# Patient Record
Sex: Male | Born: 1955 | Race: Black or African American | Hispanic: No | Marital: Married | Smoking: Former smoker
Health system: Southern US, Community
[De-identification: ages and names within clinical notes are randomized; demographics above are authoritative.]

## PROBLEM LIST (undated history)

## (undated) DIAGNOSIS — K859 Acute pancreatitis without necrosis or infection, unspecified: Secondary | ICD-10-CM

## (undated) DIAGNOSIS — E559 Vitamin D deficiency, unspecified: Secondary | ICD-10-CM

## (undated) DIAGNOSIS — N400 Enlarged prostate without lower urinary tract symptoms: Secondary | ICD-10-CM

## (undated) DIAGNOSIS — M1990 Unspecified osteoarthritis, unspecified site: Secondary | ICD-10-CM

## (undated) DIAGNOSIS — E785 Hyperlipidemia, unspecified: Secondary | ICD-10-CM

## (undated) HISTORY — DX: Acute pancreatitis without necrosis or infection, unspecified: K85.90

## (undated) HISTORY — PX: KNEE SURGERY: SHX244

## (undated) HISTORY — DX: Unspecified osteoarthritis, unspecified site: M19.90

---

## 2001-11-29 ENCOUNTER — Encounter: Payer: Self-pay | Admitting: Internal Medicine

## 2001-11-29 ENCOUNTER — Encounter: Admission: RE | Admit: 2001-11-29 | Discharge: 2001-11-29 | Payer: Self-pay | Admitting: Internal Medicine

## 2004-08-31 ENCOUNTER — Ambulatory Visit (HOSPITAL_COMMUNITY): Admission: RE | Admit: 2004-08-31 | Discharge: 2004-08-31 | Payer: Self-pay | Admitting: Gastroenterology

## 2014-07-22 LAB — HM COLONOSCOPY

## 2015-04-06 ENCOUNTER — Emergency Department (HOSPITAL_COMMUNITY)
Admission: EM | Admit: 2015-04-06 | Discharge: 2015-04-06 | Disposition: A | Discharge status: Home / Self Care | Attending: Emergency Medicine | Admitting: Emergency Medicine

## 2015-04-06 ENCOUNTER — Encounter (HOSPITAL_COMMUNITY): Payer: Self-pay | Admitting: Emergency Medicine

## 2015-04-06 ENCOUNTER — Emergency Department (HOSPITAL_COMMUNITY)

## 2015-04-06 DIAGNOSIS — Z23 Encounter for immunization: Secondary | ICD-10-CM | POA: Insufficient documentation

## 2015-04-06 DIAGNOSIS — Y998 Other external cause status: Secondary | ICD-10-CM | POA: Diagnosis not present

## 2015-04-06 DIAGNOSIS — Z87891 Personal history of nicotine dependence: Secondary | ICD-10-CM | POA: Insufficient documentation

## 2015-04-06 DIAGNOSIS — W260XXA Contact with knife, initial encounter: Secondary | ICD-10-CM | POA: Insufficient documentation

## 2015-04-06 DIAGNOSIS — Y9389 Activity, other specified: Secondary | ICD-10-CM | POA: Diagnosis not present

## 2015-04-06 DIAGNOSIS — Y9289 Other specified places as the place of occurrence of the external cause: Secondary | ICD-10-CM | POA: Insufficient documentation

## 2015-04-06 DIAGNOSIS — S61218A Laceration without foreign body of other finger without damage to nail, initial encounter: Secondary | ICD-10-CM

## 2015-04-06 DIAGNOSIS — S6991XA Unspecified injury of right wrist, hand and finger(s), initial encounter: Secondary | ICD-10-CM | POA: Diagnosis present

## 2015-04-06 DIAGNOSIS — Z8639 Personal history of other endocrine, nutritional and metabolic disease: Secondary | ICD-10-CM | POA: Diagnosis not present

## 2015-04-06 DIAGNOSIS — S61210A Laceration without foreign body of right index finger without damage to nail, initial encounter: Secondary | ICD-10-CM | POA: Diagnosis not present

## 2015-04-06 HISTORY — DX: Vitamin D deficiency, unspecified: E55.9

## 2015-04-06 HISTORY — DX: Hyperlipidemia, unspecified: E78.5

## 2015-04-06 MED ORDER — BACITRACIN ZINC 500 UNIT/GM EX OINT: 1.0000 "application " | TOPICAL_OINTMENT | Freq: Two times a day (BID) | CUTANEOUS | Status: DC

## 2015-04-06 MED ORDER — BACITRACIN ZINC 500 UNIT/GM EX OINT: TOPICAL_OINTMENT | Freq: Two times a day (BID) | CUTANEOUS | Status: DC

## 2015-04-06 MED ORDER — LIDOCAINE HCL 2 % IJ SOLN
10.0000 mL | Freq: Once | INTRAMUSCULAR | Status: AC
  Administered 2015-04-06: 200 mg
  Filled 2015-04-06: qty 20

## 2015-04-06 MED ORDER — TETANUS-DIPHTH-ACELL PERTUSSIS 5-2.5-18.5 LF-MCG/0.5 IM SUSP
0.5000 mL | Freq: Once | INTRAMUSCULAR | Status: AC
  Administered 2015-04-06: 0.5 mL via INTRAMUSCULAR
  Filled 2015-04-06: qty 0.5

## 2015-04-06 NOTE — Discharge Instructions (Signed)
You have been seen today for a laceration. Your imaging was free from abnormalities. Your laceration was sutured and you are to return to the ED or similar clinic for suture removal in 7 days. Return sooner if signs of infection arise: redness, discharge, swelling, increased heat, or fever/chills, nausea/vomiting. Follow up with PCP as needed.  The dressing should be left in place for 24 hours, after which time it can be opened to air. Wound may be gently cleaned with mild soap and water or half-strength peroxide after 24 hours to prevent crusting over the suture knots. An antibiotic ointment can be applied to the wound as well, apply the ointment two times per day until suture removal.   Laceration Care, Adult A laceration is a cut that goes through all of the layers of the skin and into the tissue that is right under the skin. Some lacerations heal on their own. Others need to be closed with stitches (sutures), staples, skin adhesive strips, or skin glue. Proper laceration care minimizes the risk of infection and helps the laceration to heal better. HOW TO CARE FOR YOUR LACERATION If sutures or staples were used:  Keep the wound clean and dry.  If you were given a bandage (dressing), you should change it at least one time per day or as told by your health care provider. You should also change it if it becomes wet or dirty.  Keep the wound completely dry for the first 24 hours or as told by your health care provider. After that time, you may shower or bathe. However, make sure that the wound is not soaked in water until after the sutures or staples have been removed.  Clean the wound one time each day or as told by your health care provider:  Wash the wound with soap and water.  Rinse the wound with water to remove all soap.  Pat the wound dry with a clean towel. Do not rub the wound.  After cleaning the wound, apply a thin layer of antibiotic ointmentas told by your health care provider.  This will help to prevent infection and keep the dressing from sticking to the wound.  Have the sutures or staples removed as told by your health care provider. If skin adhesive strips were used:  Keep the wound clean and dry.  If you were given a bandage (dressing), you should change it at least one time per day or as told by your health care provider. You should also change it if it becomes dirty or wet.  Do not get the skin adhesive strips wet. You may shower or bathe, but be careful to keep the wound dry.  If the wound gets wet, pat it dry with a clean towel. Do not rub the wound.  Skin adhesive strips fall off on their own. You may trim the strips as the wound heals. Do not remove skin adhesive strips that are still stuck to the wound. They will fall off in time. If skin glue was used:  Try to keep the wound dry, but you may briefly wet it in the shower or bath. Do not soak the wound in water, such as by swimming.  After you have showered or bathed, gently pat the wound dry with a clean towel. Do not rub the wound.  Do not do any activities that will make you sweat heavily until the skin glue has fallen off on its own.  Do not apply liquid, cream, or ointment medicine to the  wound while the skin glue is in place. Using those may loosen the film before the wound has healed.  If you were given a bandage (dressing), you should change it at least one time per day or as told by your health care provider. You should also change it if it becomes dirty or wet.  If a dressing is placed over the wound, be careful not to apply tape directly over the skin glue. Doing that may cause the glue to be pulled off before the wound has healed.  Do not pick at the glue. The skin glue usually remains in place for 5-10 days, then it falls off of the skin. General Instructions  Take over-the-counter and prescription medicines only as told by your health care provider.  If you were prescribed an  antibiotic medicine or ointment, take or apply it as told by your doctor. Do not stop using it even if your condition improves.  To help prevent scarring, make sure to cover your wound with sunscreen whenever you are outside after stitches are removed, after adhesive strips are removed, or when glue remains in place and the wound is healed. Make sure to wear a sunscreen of at least 30 SPF.  Do not scratch or pick at the wound.  Keep all follow-up visits as told by your health care provider. This is important.  Check your wound every day for signs of infection. Watch for:  Redness, swelling, or pain.  Fluid, blood, or pus.  Raise (elevate) the injured area above the level of your heart while you are sitting or lying down, if possible. SEEK MEDICAL CARE IF:  You received a tetanus shot and you have swelling, severe pain, redness, or bleeding at the injection site.  You have a fever.  A wound that was closed breaks open.  You notice a bad smell coming from your wound or your dressing.  You notice something coming out of the wound, such as wood or glass.  Your pain is not controlled with medicine.  You have increased redness, swelling, or pain at the site of your wound.  You have fluid, blood, or pus coming from your wound.  You notice a change in the color of your skin near your wound.  You need to change the dressing frequently due to fluid, blood, or pus draining from the wound.  You develop a new rash.  You develop numbness around the wound. SEEK IMMEDIATE MEDICAL CARE IF:  You develop severe swelling around the wound.  Your pain suddenly increases and is severe.  You develop painful lumps near the wound or on skin that is anywhere on your body.  You have a red streak going away from your wound.  The wound is on your hand or foot and you cannot properly move a finger or toe.  The wound is on your hand or foot and you notice that your fingers or toes look pale or  bluish.   This information is not intended to replace advice given to you by your health care provider. Make sure you discuss any questions you have with your health care provider.   Document Released: 06/07/2005 Document Revised: 10/22/2014 Document Reviewed: 06/03/2014 Elsevier Interactive Patient Education 2016 [employer].   Emergency Department Resource Guide 1) Find a Doctor and Pay Out of Pocket Although you won't have to find out who is covered by your insurance plan, it is a good idea to ask around and get recommendations. You will then need to  call the office and see if the doctor you have chosen will accept you as a new patient and what types of options they offer for patients who are self-pay. Some doctors offer discounts or will set up payment plans for their patients who do not have insurance, but you will need to ask so you aren't surprised when you get to your appointment.  2) Contact Your Local Health Department Not all health departments have doctors that can see patients for sick visits, but many do, so it is worth a call to see if yours does. If you don't know where your local health department is, you can check in your phone book. The CDC also has a tool to help you locate your state's health department, and many state websites also have listings of all of their local health departments.  3) Find a [facility] If your illness is not likely to be very severe or complicated, you may want to try a walk in clinic. These are popping up all over the country in pharmacies, drugstores, and shopping centers. They're usually staffed by nurse practitioners or physician assistants that have been trained to treat common illnesses and complaints. They're usually fairly quick and inexpensive. However, if you have serious medical issues or chronic medical problems, these are probably not your best option.  No Primary Care Doctor: - Call Health Connect at  [phone] - they can help you  locate a primary care doctor that  accepts your insurance, provides certain services, etc. - Physician Referral Service- [phone]  Chronic Pain Problems: Organization         Address  Phone   Notes  [facility]  [phone] Patients need to be referred by their primary care doctor.   Medication Assistance: Organization         Address  Phone   Notes  [facility] Medication [facility] [address] [place], [place] [address] [phone] --Must be a resident of [facility] -- Must have NO insurance coverage whatsoever (no Medicaid/ Medicare, etc.) -- The pt. MUST have a primary care doctor that directs their care regularly and follows them in the community   MedAssist  [phone]   [employer]  [phone]    Agencies that provide inexpensive medical care: Organization         Address  Phone   Notes  [place]  [phone]   [facility]    [phone]   [facility] [place],  [address] [phone]   [place] [address], [place] [phone]   Planned Parenthood    [phone]   [facility]    [phone]   [place] and [place] [address], [place] Phone:  [phone], Fax:  [phone] Hours of Operation:  9 am - 6 pm, M-F.  Also accepts Medicaid/Medicare and self-pay.  [facility] for [place] [address], [address], [place] Phone: [phone], Fax: [phone]. Hours of Operation:  8:30 am - 5:30 pm, M-F.  Also accepts Medicaid and self-pay.  [facility] [place] [address], [place] Phone: [phone]   [place], [place], [place] [phone], Ext. [phone] Mondays & Thursdays: 7-9 AM.  First 15 patients are seen on a first come, first serve basis.  [place]  Providers:  Organization         Address  Phone   Notes  [facility] [address], [place] [phone] Also accepts self-pay patients.  [facility] [address], [place]  [phone]   [place], [address], [place] [phone]   [facility] [address], [place] [phone]   Lucianne Lei [address], [place]   [phone] Only accepts [place] Access [place] patients after they have their name applied to their card.   Self-Pay (no insurance) in [facility]:  Organization         Address  Phone   Notes  Sickle Cell Patients, [facility] [place] [phone]   [facility] [place] [phone]   [facility] [place]  [place], [place], [place] [phone]   [facility]/Dr. Osei-Bonsu  [address], [place] or [address], [place] [phone] Phone number for both [place] and [place] locations is the same.  Urgent Medical and [facility] [address], [place] [phone]   [facility] [address] [place] or [address] [phone]   [facility] [address] [place] [phone], phone; [phone], fax Sees patients 1st and 3rd Saturday of every month.  Must not qualify for public or private insurance (i.e. Medicaid, Medicare, [place] Health Choice, Veterans' Benefits)  Household income should be no more than 200% of the poverty level The clinic cannot treat you if you are pregnant or think you are pregnant  Sexually transmitted diseases are not treated at the clinic.    Dental Care: Organization         Address  Phone  Notes  [facility] Department of [facility] [phone] Accepts children up to age 3 who are enrolled in [place] or [place]; pregnant women with a Medicaid card; and children who have applied for Medicaid or [place] Health Choice, but were declined, whose parents can pay a reduced fee at time of service.  [facility] [place] [facility] Department of [facility]  [address], [place] [phone] Accepts children up to age 61 who are enrolled in [place] or [place]; pregnant women with a Medicaid card; and children who have applied for Medicaid or [place] Health Choice, but were declined, whose parents can pay a reduced fee at time of service.  [place] Adult Dental Access PROGRAM  [place] [phone] Patients are seen by appointment only. Walk-ins are not accepted. [place] will see patients 30 years of age and older. Monday - Tuesday (8am-5pm) Most Wednesdays (8:30-5pm) $30 per visit, cash only  [facility] Adult Dental Access PROGRAM  [address], [facility] [phone] Patients are seen by appointment only. Walk-ins are not accepted. [place] will see patients 109 years of age and older. One Wednesday Evening (Monthly: Volunteer Based).  $30 per visit, cash only  [place]  [phone] for adults; Children under age 68, call Graduate Pediatric Dentistry at [phone]. Children aged 39-14, please call [phone] to request a  pediatric application.  Dental services are provided in all areas of dental care including fillings, crowns and bridges, complete and partial dentures, implants, gum treatment, root canals, and extractions. Preventive care is also provided. Treatment is provided to both adults and children. Patients are selected via a lottery and there is often a waiting list.   [facility] [place] [address] [place]  [phone] [email or web address]   [facility]  [address] [place], [place] [phone], Ext. [phone] Second and Fourth Thursday of each month, opens at 6:30 AM; Clinic ends at 9 AM.  Patients are seen on a first-come first-served basis, and a limited number are seen during each clinic.   [facility]  [address] Hillard Danker [place], [place] [phone]   Eligibility Requirements You must have lived in [place], [place], or [place] counties for at least the last three months.   You cannot be eligible for state or federal sponsored [employer], including [employer], [place], or [employer].   You generally cannot be eligible for healthcare insurance through your employer.    How to apply: Eligibility screenings are held every Tuesday and Wednesday afternoon from 1:00 pm until 4:00 pm. You do not need an appointment for the interview!  [facility] [address] [place], [place]   [place]  [place] Department  [place]  [phone]    Behavioral Health Resources in the Community: Intensive Outpatient Programs Organization         Address  Phone  Notes  [place]. [address] [place], [place] [phone]   [facility] Outpatient [address], [place], [place]   ADS: Alcohol & Drug Svcs [address] [place], [place]   [place] [address]  [place], [place] or [phone]   Substance Abuse Resources Organization         Address  Phone  Notes  Alcohol and Drug Services  [phone]   [place]  [phone]   The [place]   Chinita Pester  [phone]   Residential & Outpatient Substance Abuse Program  [phone]   Psychological Services Organization         Address  Phone  Notes  [facility] [place]  [place]  [phone]   [place] [address] [place] [address], [place] or [phone]    Mobile Crisis Teams Organization         Address  Phone  Notes  [facility]atives, Mobile Crisis Care Unit  [phone]   Assertive Psychotherapeutic Services  [address] [place], [place]   [place] Levels [address] [place] [phone]    Self-Help/Support Groups Organization         Address  Phone             Notes  [place]. of [place] - variety of support groups  [place] Call for more information  Narcotics Anonymous (NA), [facility]aring [facility] [address], [employer] [place]  2 meetings at this location   Special educational needs teacher         Address  Phone  Notes  [facility] [place],    [place]  [place]  [place],  [address], [place], [place]   [place], [place] [phone] Admissions: 8am-3pm M-F  Incentives Substance [place] [address],    [place], [place] [phone]   [facility] [address] [place], [place], [place]   [facility] [address],  [place], [place]   [facility]ight Programs - Intensive Outpatient [address] Kristeen [address], [place], [place]   [facility] ([place].) [place].,  [place], [place] [phone] or [phone]   [facility]idential Treatment Services (RTS) [address], [place], [place] Accepts Medicaid  [facility] [place] [address]  [place] [phone] Substance Abuse/Addiction Treatment   [facility] Organization         Address  Phone  Notes  [facility]  [phone]   Domenic Schwab, PhD [address] Arlis [place], [place]   [phone] or [phone]    Zayante Tabor [place], [place] [phone]   [facility] [address] [place], [place] [phone] Insurance/Medicaid/sponsorship through [facility] and Families [address], [address] [place]                                    [place], [place] [phone] [place] [address], [place] [phone]    Dr. Adele Schilder  [phone]   [facility] Dept. 1) [address] [place] 2) [place] 3)  [place] [address], [place] [phone]  [phone]   [place] [phone] or [phone] (After Hours)

## 2015-04-06 NOTE — ED Notes (Signed)
Shawn, PA at bedside to suture lac

## 2015-04-06 NOTE — ED Provider Notes (Signed)
CSN: [id number]     Arrival date & time 04/06/15  1652 History   First MD Initiated Contact with Patient 04/06/15 1752     Chief Complaint  Patient presents with  . Finger Injury     (Consider location/radiation/quality/duration/timing/severity/associated sxs/prior Treatment) HPI  Robert Donaldson is a 59 y.o. male, presents with a laceration to the right index finger that pt did himself accidentally with a knife about an hour ago. Pt denies any further injuries. Bleeding controlled prior to arrival. Pt denies being a current smoker.  Past Medical History  Diagnosis Date  . HLD (hyperlipidemia)   . Vitamin D deficiency    Past Surgical History  Procedure Laterality Date  . Knee surgery     Family History  Problem Relation Age of Onset  . Hyperlipidemia Sister    Social History  Substance Use Topics  . Smoking status: Former Smoker    Types: Cigarettes  . Smokeless tobacco: None  . Alcohol Use: 0.6 oz/week    1 Glasses of wine per week     Comment: social    Review of Systems  Skin: Positive for wound.  Neurological: Negative for syncope, weakness and light-headedness.      Allergies  Review of patient's allergies indicates no known allergies.  Home Medications   Prior to Admission medications   Medication Sig Start Date End Date Taking? Authorizing Provider  bacitracin ointment Apply 1 application topically 2 (two) times daily. 04/06/15   Laveta Gilkey C Nataline Basara, PA-C   BP 115/79 mmHg  Pulse 75  Temp(Src) 98.1 F (36.7 C) (Oral)  Resp 20  SpO2 98% Physical Exam  Constitutional: He appears well-developed and well-nourished. No distress.  Cardiovascular: Normal rate and regular rhythm.   Pulmonary/Chest: Effort normal.  Musculoskeletal:  Full ROM to right index finger and hand.  Neurological: He is alert.  Strength 5/5 in both index fingers. Grip strength equal. No numbness, tingling or other neurologic deficits.   Skin: Skin is warm and dry. He is not diaphoretic.   Nursing note and vitals reviewed.   ED Course  .Marland KitchenLaceration Repair Date/Time: 04/06/2015 7:07 PM Performed by: Lorayne Bender Authorized by: Lorayne Bender Consent: Verbal consent obtained. Risks and benefits: risks, benefits and alternatives were discussed Consent given by: patient Patient understanding: patient states understanding of the procedure being performed Patient consent: the patient's understanding of the procedure matches consent given Procedure consent: procedure consent matches procedure scheduled Relevant documents: relevant documents present and verified Test results: test results available and properly labeled Site marked: the operative site was marked Imaging studies: imaging studies available Patient identity confirmed: verbally with patient and arm band Time out: Immediately prior to procedure a "time out" was called to verify the correct patient, procedure, equipment, support staff and site/side marked as required. Body area: upper extremity Location details: right index finger Laceration length: 2 cm Foreign bodies: no foreign bodies Tendon involvement: none Nerve involvement: none Vascular damage: no Anesthesia: local infiltration Local anesthetic: lidocaine 2% without epinephrine Anesthetic total: 1.5 ml Patient sedated: no Preparation: Patient was prepped and draped in the usual sterile fashion. Irrigation solution: saline Irrigation method: syringe Amount of cleaning: standard Debridement: none Degree of undermining: none Skin closure: 4-0 nylon Number of sutures: 5 Technique: simple Approximation: close Approximation difficulty: simple Dressing: 4x4 sterile gauze, splint and antibiotic ointment Patient tolerance: Patient tolerated the procedure well with no immediate complications   (including critical care time) Labs Review Labs Reviewed - No data to display  Imaging Review Dg Finger Index Right  04/06/2015  CLINICAL DATA:  Laceration  index finger today.  Initial encounter. EXAM: RIGHT INDEX FINGER 2+V COMPARISON:  None. FINDINGS: No fracture, dislocation or radiopaque foreign body is identified. The patient's laceration is not visualized. IMPRESSION: Negative exam. Electronically Signed   By: Inge Rise M.D.   On: 04/06/2015 18:31   I have personally reviewed and evaluated these images and lab results as part of my medical decision-making.   EKG Interpretation None      MDM   Final diagnoses:  Laceration of index finger, initial encounter    Robert Donaldson presents with an isolated laceration to the right index finger at the PIP joint.   Full ROM, capillary refill normal, neuro and motor intact. Doubt nerve or tendon involvement. If finger xray is normal, will suture and discharge patient with appropriate wound care instructions. Pt to return in 7 days for suture removal. Wound to be covered with antibiotic ointment and a sterile bandage. Finger xray without evidence of fracture. Pt has not had a tetanus vaccine within the last five years, ordered tetanus vaccine.    Lorayne Bender, PA-C 04/06/15 1921  Lacretia Leigh, MD 04/06/15 [phone]

## 2015-04-06 NOTE — ED Notes (Signed)
Pt reported having rt 2nd finger lac s/p to cutting self with knife with controlled bleeding noted.

## 2015-12-15 ENCOUNTER — Encounter (HOSPITAL_COMMUNITY): Payer: Self-pay | Admitting: Emergency Medicine

## 2015-12-15 ENCOUNTER — Emergency Department (HOSPITAL_COMMUNITY)
Admission: EM | Admit: 2015-12-15 | Discharge: 2015-12-15 | Disposition: A | Discharge status: Home / Self Care | Attending: Emergency Medicine | Admitting: Emergency Medicine

## 2015-12-15 ENCOUNTER — Emergency Department (HOSPITAL_COMMUNITY)

## 2015-12-15 DIAGNOSIS — E785 Hyperlipidemia, unspecified: Secondary | ICD-10-CM | POA: Insufficient documentation

## 2015-12-15 DIAGNOSIS — Z87891 Personal history of nicotine dependence: Secondary | ICD-10-CM | POA: Insufficient documentation

## 2015-12-15 DIAGNOSIS — M79674 Pain in right toe(s): Secondary | ICD-10-CM | POA: Diagnosis present

## 2015-12-15 MED ORDER — IBUPROFEN 200 MG PO TABS
600.0000 mg | ORAL_TABLET | Freq: Once | ORAL | Status: AC
  Administered 2015-12-15: 600 mg via ORAL
  Filled 2015-12-15: qty 3

## 2015-12-15 NOTE — ED Notes (Signed)
Patient reports right index toe pain after hitting it. States that pain is getting worse.

## 2015-12-15 NOTE — ED Provider Notes (Signed)
CSN: [id number]     Arrival date & time 12/15/15  2029 History  By signing my name below, I, Jasmyn B. Alexander, attest that this documentation has been prepared under the direction and in the presence of Janetta Hora, PA-C.  Electronically Signed: Tedra Coupe. Sheppard Coil, ED Scribe. 12/15/2015. 8:56 PM.    Chief Complaint  Patient presents with  . Toe Pain   The history is provided by the patient. No language interpreter was used.   HPI Comments: Robert Donaldson is a 60 y.o. male with no significant history presents to the Emergency Department complaining of gradual onset, constant, worsening, right second toe pain x 1 day. Pt states that he woke up with pain on 12/15/15. Pt is unaware of any recent injury to the toe. He has associated redness and swelling of the right 2nd toe. Pt states pain is exacerbated with flexion of the toes and ambulation. No hx of Gout. Denies any fever, pruritus, or drainage from the toe. Pt denies any other symptoms at this time.   Past Medical History  Diagnosis Date  . HLD (hyperlipidemia)   . Vitamin D deficiency    Past Surgical History  Procedure Laterality Date  . Knee surgery     Family History  Problem Relation Age of Onset  . Hyperlipidemia Sister    Social History  Substance Use Topics  . Smoking status: Former Smoker    Types: Cigarettes  . Smokeless tobacco: None  . Alcohol Use: 0.6 oz/week    1 Glasses of wine per week     Comment: social    Review of Systems  Constitutional: Negative for fever.  Musculoskeletal: Positive for myalgias, joint swelling and arthralgias.  Skin: Positive for color change.  All other systems reviewed and are negative.  Allergies  Review of patient's allergies indicates no known allergies.  Home Medications   Prior to Admission medications   Medication Sig Start Date End Date Taking? Authorizing Provider  bacitracin ointment Apply 1 application topically 2 (two) times daily. 04/06/15   Shawn C Joy, PA-C    BP 130/72 mmHg  Pulse 86  Temp(Src) 98.5 F (36.9 C) (Oral)  Resp 18  SpO2 100%   Physical Exam  Constitutional: He is oriented to person, place, and time. He appears well-developed and well-nourished. No distress.  HENT:  Head: Normocephalic and atraumatic.  Eyes: Conjunctivae are normal. Pupils are equal, round, and reactive to light. Right eye exhibits no discharge. Left eye exhibits no discharge. No scleral icterus.  Neck: Normal range of motion.  Cardiovascular: Normal rate.   Pulmonary/Chest: Effort normal. No respiratory distress.  Abdominal: Soft. He exhibits no distension.  Musculoskeletal:  Right second toe: Mild swelling and redness. No deformity. Moderate tenderness to palpation of DIP and PIP joints . Decreased ROM. N/V intact.    Neurological: He is alert and oriented to person, place, and time.  Skin: Skin is warm and dry.  Psychiatric: He has a normal mood and affect.  Nursing note and vitals reviewed.   ED Course  Procedures (including critical care time) DIAGNOSTIC STUDIES: Oxygen Saturation is 100% on RA, normal by my interpretation.    COORDINATION OF CARE: 8:49 PM-Discussed treatment plan which includes X-ray of right foot and order of Ibuprofen with pt at bedside and pt agreed to plan.   Imaging Review Dg Foot Complete Right  12/15/2015  CLINICAL DATA:  Nontraumatic pain to the third MTP EXAM: RIGHT FOOT COMPLETE - 3+ VIEW COMPARISON:  None. FINDINGS: There  is no evidence of fracture or dislocation. There is no evidence of arthropathy or other focal bone abnormality. Soft tissues are unremarkable. IMPRESSION: Negative. Electronically Signed   By: Andreas Newport M.D.   On: 12/15/2015 21:25   I have personally reviewed and evaluated these images and lab results as part of my medical decision-making.   MDM   Final diagnoses:  Pain of toe of right foot   60 year old male who presents with atraumatic 2nd toe pain. Unclear etiology. Xray is  negative for acute abnormality. Vitals are WNL and stable. Will treat with supportive care - Ibuprofen 600mg  given here in ED and advised to continue at home. PCP follow up if symptoms are not improving. Patient is NAD, non-toxic, with stable VS. Patient is informed of clinical course, understands medical decision making process, and agrees with plan. Opportunity for questions provided and all questions answered. Return precautions given.   I personally performed the services described in this documentation, which was scribed in my presence. The recorded information has been reviewed and is accurate.    Recardo Evangelist, PA-C 12/16/15 1000  Charlesetta Shanks, MD 12/20/15 1046

## 2015-12-15 NOTE — Discharge Instructions (Signed)

## 2017-10-06 ENCOUNTER — Ambulatory Visit (INDEPENDENT_AMBULATORY_CARE_PROVIDER_SITE_OTHER): Admitting: Podiatry

## 2017-10-06 ENCOUNTER — Encounter: Payer: Self-pay | Admitting: Podiatry

## 2017-10-06 VITALS — BP 133/67 | HR 74 | Ht 69.0 in | Wt 180.0 lb

## 2017-10-06 DIAGNOSIS — M722 Plantar fascial fibromatosis: Secondary | ICD-10-CM | POA: Diagnosis not present

## 2017-10-06 DIAGNOSIS — M216X2 Other acquired deformities of left foot: Secondary | ICD-10-CM

## 2017-10-06 DIAGNOSIS — M216X1 Other acquired deformities of right foot: Secondary | ICD-10-CM

## 2017-10-06 DIAGNOSIS — M21969 Unspecified acquired deformity of unspecified lower leg: Secondary | ICD-10-CM

## 2017-10-06 NOTE — Patient Instructions (Signed)
Seen for bilateral heel pain. Noted of weak first ray bilateral, mild tightness in Achilles tendon bilateral with increased Pronation. Cortisone injection given to both heels. Night Splint dispensed bilateral. Metatarsal binder dispensed bilateral. Will get authorization on custom orthotics.

## 2017-10-06 NOTE — Progress Notes (Signed)
SUBJECTIVE: 62 y.o. year old male presents complaining of bilateral heel pain duration of over 30 years. Was here for the same problem and was treated with Cortisone injection. The injection helped. On feet off and on varies.  Review of Systems  Constitutional: Negative.   HENT: Negative.   Respiratory: Negative.   Cardiovascular: Negative.   Gastrointestinal: Negative.   Genitourinary: Negative.   Musculoskeletal: Negative.   Skin: Negative.     OBJECTIVE: DERMATOLOGIC EXAMINATION: No abnormal findings.  VASCULAR EXAMINATION OF LOWER LIMBS: Pedal pulses: All pedal pulses are palpable with normal pulsation.  Capillary Filling times within 3 seconds in all digits.  No edema or erythema noted. Temperature gradient from tibial crest to dorsum of foot is within normal bilateral.  NEUROLOGIC EXAMINATION OF THE LOWER LIMBS: Achilles DTR is present and within normal. Monofilament (Semmes-Weinstein 10-gm) sensory testing positive 6 out of 6, bilateral. Vibratory sensations(128Hz  turning fork) intact at medial and lateral forefoot bilateral.  Sharp and Dull discriminatory sensations at the plantar ball of hallux is intact bilateral.   MUSCULOSKELETAL EXAMINATION: Positive for tight Achilles tendon bilateral. Hypermobile first ray bilateral L>R.  ASSESSMENT: Plantar fasciitis bilateral. Contracture bilateral Achilles tendon. Hypermobile first ray L>R. STJ hyperpronation bilateral.  PLAN: Reviewed findings and available treatment options. Achilles tendon stretch exercise reviewed to follow daily. Night Splint dispensed with instruction x 2. Metatarsal binder dispensed x 2. As per request, both heels injected with mixture of 4 mg Dexamethasone, 4 mg Triamcinolone, and 1 cc of 0.5% Marcaine plain. Patient tolerated well without difficulty.

## 2017-10-14 ENCOUNTER — Telehealth: Payer: Self-pay | Admitting: *Deleted

## 2017-10-14 NOTE — Telephone Encounter (Signed)
Called insurance and for CPT code L3020 the patient has a $400 deductible. After this has been met the plan covers 100% of coinsurance and  100% of copays.  Reference # for the call was [id number] 

## 2018-04-22 ENCOUNTER — Encounter: Payer: Self-pay | Admitting: Family Medicine

## 2018-06-06 DIAGNOSIS — H353112 Nonexudative age-related macular degeneration, right eye, intermediate dry stage: Secondary | ICD-10-CM | POA: Diagnosis not present

## 2018-06-06 DIAGNOSIS — H40023 Open angle with borderline findings, high risk, bilateral: Secondary | ICD-10-CM | POA: Diagnosis not present

## 2018-06-06 DIAGNOSIS — H2513 Age-related nuclear cataract, bilateral: Secondary | ICD-10-CM | POA: Diagnosis not present

## 2018-06-23 DIAGNOSIS — M79672 Pain in left foot: Secondary | ICD-10-CM | POA: Diagnosis not present

## 2018-06-23 DIAGNOSIS — M722 Plantar fascial fibromatosis: Secondary | ICD-10-CM | POA: Diagnosis not present

## 2018-06-23 DIAGNOSIS — M79671 Pain in right foot: Secondary | ICD-10-CM | POA: Diagnosis not present

## 2018-07-08 ENCOUNTER — Other Ambulatory Visit: Payer: Self-pay | Admitting: Nurse Practitioner

## 2018-07-26 ENCOUNTER — Encounter: Payer: Self-pay | Admitting: Nurse Practitioner

## 2018-07-26 ENCOUNTER — Ambulatory Visit: Admitting: Internal Medicine

## 2018-07-26 ENCOUNTER — Ambulatory Visit

## 2018-07-26 ENCOUNTER — Ambulatory Visit: Admitting: Nurse Practitioner

## 2018-07-26 ENCOUNTER — Ambulatory Visit (INDEPENDENT_AMBULATORY_CARE_PROVIDER_SITE_OTHER): Payer: Medicare Other | Admitting: Nurse Practitioner

## 2018-07-26 VITALS — BP 116/70 | HR 53 | Temp 97.6°F | Wt 183.8 lb

## 2018-07-26 DIAGNOSIS — Z125 Encounter for screening for malignant neoplasm of prostate: Secondary | ICD-10-CM | POA: Diagnosis not present

## 2018-07-26 DIAGNOSIS — R7309 Other abnormal glucose: Secondary | ICD-10-CM

## 2018-07-26 DIAGNOSIS — R351 Nocturia: Secondary | ICD-10-CM | POA: Insufficient documentation

## 2018-07-26 DIAGNOSIS — Z23 Encounter for immunization: Secondary | ICD-10-CM | POA: Diagnosis not present

## 2018-07-26 DIAGNOSIS — E785 Hyperlipidemia, unspecified: Secondary | ICD-10-CM | POA: Diagnosis not present

## 2018-07-26 DIAGNOSIS — M25532 Pain in left wrist: Secondary | ICD-10-CM

## 2018-07-26 DIAGNOSIS — Z0000 Encounter for general adult medical examination without abnormal findings: Secondary | ICD-10-CM | POA: Diagnosis not present

## 2018-07-26 DIAGNOSIS — Z114 Encounter for screening for human immunodeficiency virus [HIV]: Secondary | ICD-10-CM

## 2018-07-26 LAB — POCT URINALYSIS DIPSTICK
Bilirubin, UA: NEGATIVE
Glucose, UA: NEGATIVE
KETONES UA: NEGATIVE
Leukocytes, UA: NEGATIVE
NITRITE UA: NEGATIVE
PH UA: 5.5 (ref 5.0–8.0)
PROTEIN UA: NEGATIVE
RBC UA: NEGATIVE
Spec Grav, UA: 1.02 (ref 1.010–1.025)
UROBILINOGEN UA: 0.2 U/dL

## 2018-07-26 NOTE — Progress Notes (Addendum)
Subjective:     Patient ID: Robert Donaldson , male    DOB: 1956-05-11 , 63 y.o.   MRN: [id number]   Chief Complaint  Patient presents with  . Medicare Wellness   Men's preventive visit. Patient Health Questionnaire (PHQ-2) is    Clinical Support from 07/26/2018 in [facility]  PHQ-2 Total Score  0    . Patient is on a regular diet. Marital status: Married. Relevant history for alcohol use is:   Social History   Substance and Sexual Activity  Alcohol Use Yes  . Alcohol/week: 1.0 standard drinks  . Types: 1 Glasses of wine per week   Comment: social  . Relevant history for tobacco use is:  Social History   Tobacco Use  Smoking Status Former Smoker  . Types: Cigarettes  Smokeless Tobacco Never Used   HPI  Hyperlipidemia - taking rosuvastatin daily  Wrist Pain   The pain is present in the left wrist. This is a new problem. The current episode started more than 1 month ago. The problem occurs intermittently. The problem has been unchanged. The pain is mild. Pertinent negatives include no fever or numbness. The treatment provided no relief. Family history does not include gout.     Past Medical History:  Diagnosis Date  . HLD (hyperlipidemia)   . Vitamin D deficiency      Family History  Problem Relation Age of Onset  . Hyperlipidemia Sister      Current Outpatient Medications:  .  Nutritional Supplements (VITAMIN D BOOSTER PO), Take 1 capsule by mouth daily., Disp: , Rfl:  .  rosuvastatin (CRESTOR) 5 MG tablet, TAKE 1 TABLET DAILY, Disp: 90 tablet, Rfl: 0   No Known Allergies   Review of Systems  Constitutional: Negative for fever.  Neurological: Negative for numbness.     Today's Vitals   07/26/18 1146  BP: 116/70  Pulse: (!) 53  Temp: 97.6 F (36.4 C)  TempSrc: Oral  SpO2: 97%  Weight: 183 lb 12.8 oz (83.4 kg)  PainSc: 0-No pain   Body mass index is 27.14 kg/m.   Objective:  Physical Exam Constitutional:      Appearance:  Normal appearance.  Neck:     Musculoskeletal: Normal range of motion and neck supple.  Cardiovascular:     Rate and Rhythm: Normal rate and regular rhythm.     Pulses: Normal pulses.     Heart sounds: Normal heart sounds. No murmur.  Pulmonary:     Effort: Pulmonary effort is normal. No respiratory distress.     Breath sounds: Normal breath sounds.  Abdominal:     General: Abdomen is flat.  Musculoskeletal:        General: Swelling (left wrist posterior) present.  Skin:    General: Skin is warm and dry.  Neurological:     General: No focal deficit present.     Mental Status: He is alert.  Psychiatric:        Mood and Affect: Mood normal.         Assessment And Plan:     1. Medicare annual wellness visit, initial  Welcome to Medicare visit done today  Pt's annual wellness exam was performed and geriatric assessment reviewed.   Pt has no new identiafble wellness concerns at this time.   WIll obtain routine labs.   Will obtain UA and micro.   Behavior modifications discussed and diet history reviewed. Pt will continue to exercise regularly and modify diet, with low GI,  plant based foods and decrease food intake of processed foods.   Recommend intake of daily multivitamin, Vitamin D, and calcium.  Recommond mammogram and colonoscopy for preventive screenings, as well as recommend immunizations that include influenza (up to date) and TDAP  2. Health maintenance examination . Behavior modifications discussed and diet history reviewed.   . Pt will continue to exercise regularly and modify diet with low GI, plant based foods and decrease intake of processed foods.  . Recommend intake of daily multivitamin, Vitamin D, and calcium.   3. Encounter for immunization  - Pneumococcal polysaccharide vaccine 23-valent greater than or equal to 2yo subcutaneous/IM  4. Encounter for prostate cancer screening  - PSA  5. Hyperlipidemia, unspecified hyperlipidemia type  Chronic,  controlled  Continue with current medications - Lipid panel  6. Left wrist pain  Left wrist with soft area to anterior left hand  Causing pain and discomfort  - Ambulatory referral to Hand Surgery  7. Abnormal glucose  Chronic, controlled  Continue with current medications  Encouraged to limit intake of sugary foods and drinks  Encouraged to increase physical activity to 150 minutes per week - CBC no Diff - Hemoglobin A1c  8. Nocturia  Will check PSA - PSA  9. Screening for human immunodeficiency virus  - HIV antibody (with reflex)       Minette Brine, FNP    Subjective:   Robert Donaldson is a 63 y.o. male who presents for a Welcome to Medicare exam.   Review of Systems: See above       Objective:    Today's Vitals   07/26/18 1146  BP: 116/70  Pulse: (!) 53  Temp: 97.6 F (36.4 C)  TempSrc: Oral  SpO2: 97%  Weight: 183 lb 12.8 oz (83.4 kg)  PainSc: 0-No pain   Body mass index is 27.14 kg/m.  Medications Outpatient Encounter Medications as of 07/26/2018  Medication Sig  . Nutritional Supplements (VITAMIN D BOOSTER PO) Take 1 capsule by mouth daily.  . rosuvastatin (CRESTOR) 5 MG tablet TAKE 1 TABLET DAILY   No facility-administered encounter medications on file as of 07/26/2018.      History: Past Medical History:  Diagnosis Date  . HLD (hyperlipidemia)   . Vitamin D deficiency    Past Surgical History:  Procedure Laterality Date  . KNEE SURGERY      Family History  Problem Relation Age of Onset  . Hyperlipidemia Sister    Social History   Occupational History  . Not on file  Tobacco Use  . Smoking status: Former Smoker    Types: Cigarettes  . Smokeless tobacco: Never Used  Substance and Sexual Activity  . Alcohol use: Yes    Alcohol/week: 1.0 standard drinks    Types: 1 Glasses of wine per week    Comment: social  . Drug use: No  . Sexual activity: Never   Tobacco Counseling Counseling given: Not  Answered   Immunizations and Health Maintenance Immunization History  Administered Date(s) Administered  . Pneumococcal Polysaccharide-23 07/26/2018  . Tdap 04/06/2015   Health Maintenance Due  Topic Date Due  . COLONOSCOPY  06/22/2003    Activities of Daily Living In your present state of health, do you have any difficulty performing the following activities: 07/26/2018  Hearing? N  Vision? N  Difficulty concentrating or making decisions? N  Walking or climbing stairs? N  Dressing or bathing? N  Doing errands, shopping? N  Some recent data might be hidden  Physical Exam  (optional), or other factors deemed appropriate based on the beneficiary's medical and social history and current clinical standards.  Advanced Directives: Does Patient Have a Medical Advance Directive?: No Would patient like information on creating a medical advance directive?: Yes (MAU/Ambulatory/Procedural Areas - Information given)    Assessment:    This is a routine wellness  examination for this patient .   Vision/Hearing screen  Hearing Screening   125Hz  250Hz  500Hz  1000Hz  2000Hz  3000Hz  4000Hz  6000Hz  8000Hz   Right ear:   20 20 20  20     Left ear:   20 20 20  20       Visual Acuity Screening   Right eye Left eye Both eyes  Without correction:     With correction: 20/20 20/30 20/30     Dietary issues and exercise activities discussed:     Goals    . Weight (lb) < 170 lb (77.1 kg)     I would like to lose my belly fat       Depression Screen PHQ 2/9 Scores 09/12/2018  PHQ - 2 Score 0     Fall Risk No flowsheet data found.  Cognitive Function MMSE - Mini Mental State Exam 07/26/2018  Orientation to time 5  Orientation to Place 5  Registration 3  Attention/ Calculation 5  Recall 3  Language- name 2 objects 2  Language- repeat 1  Language- follow 3 step command 3  Language- read & follow direction 1  Write a sentence 1        Patient Care Team: Minette Brine, FNP as PCP -  General (General Practice)     Plan:     I have personally reviewed and noted the following in the patient's chart:   . Medical and social history . Use of alcohol, tobacco or illicit drugs  . Current medications and supplements . Functional ability and status . Nutritional status . Physical activity . Advanced directives . List of other physicians . Hospitalizations, surgeries, and ER visits in previous 12 months . Vitals . Screenings to include cognitive, depression, and falls . Referrals and appointments  In addition, I have reviewed and discussed with patient certain preventive protocols, quality metrics, and best practice recommendations. A written personalized care plan for preventive services as well as general preventive health recommendations were provided to patient.    Minette Brine, FNP 09/12/2018

## 2018-07-26 NOTE — Patient Instructions (Addendum)
Robert Donaldson , Thank you for taking time to come for your Medicare Wellness Visit. I appreciate your ongoing commitment to your health goals. Please review the following plan we discussed and let me know if I can assist you in the future.   These are the goals we discussed: Goals    . Weight (lb) < 170 lb (77.1 kg)     I would like to lose my belly fat       This is a list of the screening recommended for you and due dates:  Health Maintenance  Topic Date Due  . Colon Cancer Screening  06/22/2003  . Pneumonia vaccines (1 of 2 - PCV13) 06/21/2018  . Flu Shot  09/19/2018*  . Tetanus Vaccine  04/05/2025  .  Hepatitis C: One time screening is recommended by Center for Disease Control  (CDC) for  adults born from [birth year] through 1967.   Completed  . HIV Screening  Completed  *Topic was postponed. The date shown is not the original due date.    Health Maintenance After Age 49 After age 60, you are at a higher risk for certain long-term diseases and infections as well as injuries from falls. Falls are a major cause of broken bones and head injuries in people who are older than age 39. Getting regular preventive care can help to keep you healthy and well. Preventive care includes getting regular testing and making lifestyle changes as recommended by your health care provider. Talk with your health care provider about:  Which screenings and tests you should have. A screening is a test that checks for a disease when you have no symptoms.  A diet and exercise plan that is right for you. What should I know about screenings and tests to prevent falls? Screening and testing are the best ways to find a health problem early. Early diagnosis and treatment give you the best chance of managing medical conditions that are common after age 43. Certain conditions and lifestyle choices may make you more likely to have a fall. Your health care provider may recommend:  Regular vision checks. Poor vision and  conditions such as cataracts can make you more likely to have a fall. If you wear glasses, make sure to get your prescription updated if your vision changes.  Medicine review. Work with your health care provider to regularly review all of the medicines you are taking, including over-the-counter medicines. Ask your health care provider about any side effects that may make you more likely to have a fall. Tell your health care provider if any medicines that you take make you feel dizzy or sleepy.  Osteoporosis screening. Osteoporosis is a condition that causes the bones to get weaker. This can make the bones weak and cause them to break more easily.  Blood pressure screening. Blood pressure changes and medicines to control blood pressure can make you feel dizzy.  Strength and balance checks. Your health care provider may recommend certain tests to check your strength and balance while standing, walking, or changing positions.  Foot health exam. Foot pain and numbness, as well as not wearing proper footwear, can make you more likely to have a fall.  Depression screening. You may be more likely to have a fall if you have a fear of falling, feel emotionally low, or feel unable to do activities that you used to do.  Alcohol use screening. Using too much alcohol can affect your balance and may make you more likely to have a  fall. What actions can I take to lower my risk of falls? General instructions  Talk with your health care provider about your risks for falling. Tell your health care provider if: ? You fall. Be sure to tell your health care provider about all falls, even ones that seem minor. ? You feel dizzy, sleepy, or off-balance.  Take over-the-counter and prescription medicines only as told by your health care provider. These include any supplements.  Eat a healthy diet and maintain a healthy weight. A healthy diet includes low-fat dairy products, low-fat (lean) meats, and fiber from whole  grains, beans, and lots of fruits and vegetables. Home safety  Remove any tripping hazards, such as rugs, cords, and clutter.  Install safety equipment such as grab bars in bathrooms and safety rails on stairs.  Keep rooms and walkways well-lit. Activity   Follow a regular exercise program to stay fit. This will help you maintain your balance. Ask your health care provider what types of exercise are appropriate for you.  If you need a cane or walker, use it as recommended by your health care provider.  Wear supportive shoes that have nonskid soles. Lifestyle  Do not drink alcohol if your health care provider tells you not to drink.  If you drink alcohol, limit how much you have: ? 0-1 drink a day for women. ? 0-2 drinks a day for men.  Be aware of how much alcohol is in your drink. In the [place] one drink equals one typical bottle of beer (12 oz), one-half glass of wine (5 oz), or one shot of hard liquor (1 oz).  Do not use any products that contain nicotine or tobacco, such as cigarettes and e-cigarettes. If you need help quitting, ask your health care provider. Summary  Having a healthy lifestyle and getting preventive care can help to protect your health and wellness after age 35.  Screening and testing are the best way to find a health problem early and help you avoid having a fall. Early diagnosis and treatment give you the best chance for managing medical conditions that are more common for people who are older than age 61.  Falls are a major cause of broken bones and head injuries in people who are older than age 57. Take precautions to prevent a fall at home.  Work with your health care provider to learn what changes you can make to improve your health and wellness and to prevent falls. This information is not intended to replace advice given to you by your health care provider. Make sure you discuss any questions you have with your health care provider. Document  Released: 04/20/2017 Document Revised: 04/20/2017 Document Reviewed: 04/20/2017 Elsevier Interactive Patient Education  2019 [employer] Inc.  Pneumococcal Conjugate Vaccine (PCV13): What You Need to Know 1. Why get vaccinated? Vaccination can protect both children and adults from pneumococcal disease. Pneumococcal disease is caused by bacteria that can spread from person to person through close contact. It can cause ear infections, and it can also lead to more serious infections of the:  Lungs (pneumonia),  Blood (bacteremia), and  Covering of the brain and spinal cord (meningitis). Pneumococcal pneumonia is most common among adults. Pneumococcal meningitis can cause deafness and brain damage, and it kills about 1 child in 10 who get it. Anyone can get pneumococcal disease, but children under 40 years of age and adults 67 years and older, people with certain medical conditions, and cigarette smokers are at the highest risk. Before there  was a vaccine, the [place] saw:  more than 700 cases of meningitis,  about 13,000 blood infections,  about 5 million ear infections, and  about 200 deaths in children under 5 each year from pneumococcal disease. Since vaccine became available, severe pneumococcal disease in these children has fallen by 88%. About 18,000 older adults die of pneumococcal disease each year in the [place]. Treatment of pneumococcal infections with penicillin and other drugs is not as effective as it used to be, because some strains of the disease have become resistant to these drugs. This makes prevention of the disease, through vaccination, even more important. 2. PCV13 vaccine Pneumococcal conjugate vaccine (called PCV13) protects against 13 types of pneumococcal bacteria. PCV13 is routinely given to children at 2, 4, 6, and 12-83 months of age. It is also recommended for children and adults 4 to 15 years of age with certain health conditions, and for all adults  3 years of age and older. Your doctor can give you details. 3. Some people should not get this vaccine Anyone who has ever had a life-threatening allergic reaction to a dose of this vaccine, to an earlier pneumococcal vaccine called PCV7, or to any vaccine containing diphtheria toxoid (for example, DTaP), should not get PCV13. Anyone with a severe allergy to any component of PCV13 should not get the vaccine. Tell your doctor if the person being vaccinated has any severe allergies. If the person scheduled for vaccination is not feeling well, your healthcare provider might decide to reschedule the shot on another day. 4. Risks of a vaccine reaction With any medicine, including vaccines, there is a chance of reactions. These are usually mild and go away on their own, but serious reactions are also possible. Problems reported following PCV13 varied by age and dose in the series. The most common problems reported among children were:  About half became drowsy after the shot, had a temporary loss of appetite, or had redness or tenderness where the shot was given.  About 1 out of 3 had swelling where the shot was given.  About 1 out of 3 had a mild fever, and about 1 in 20 had a fever over 102.58F.  Up to about 8 out of 10 became fussy or irritable. Adults have reported pain, redness, and swelling where the shot was given; also mild fever, fatigue, headache, chills, or muscle pain. Young children who get PCV13 along with inactivated flu vaccine at the same time may be at increased risk for seizures caused by fever. Ask your doctor for more information. Problems that could happen after any vaccine:  People sometimes faint after a medical procedure, including vaccination. Sitting or lying down for about 15 minutes can help prevent fainting, and injuries caused by a fall. Tell your doctor if you feel dizzy, or have vision changes or ringing in the ears.  Some older children and adults get severe pain in  the shoulder and have difficulty moving the arm where a shot was given. This happens very rarely.  Any medication can cause a severe allergic reaction. Such reactions from a vaccine are very rare, estimated at about 1 in a million doses, and would happen within a few minutes to a few hours after the vaccination. As with any medicine, there is a very small chance of a vaccine causing a serious injury or death. The safety of vaccines is always being monitored. For more information, visit: http://www.aguilar.org/ 5. What if there is a serious reaction? What should I  look for?  Look for anything that concerns you, such as signs of a severe allergic reaction, very high fever, or unusual behavior. Signs of a severe allergic reaction can include hives, swelling of the face and throat, difficulty breathing, a fast heartbeat, dizziness, and weakness-usually within a few minutes to a few hours after the vaccination. What should I do?  If you think it is a severe allergic reaction or other emergency that can't wait, call 9-1-1 or get the person to the nearest hospital. Otherwise, call your doctor. Reactions should be reported to the Vaccine Adverse Event Reporting System (VAERS). Your doctor should file this report, or you can do it yourself through the VAERS web site at www.vaers.SamedayNews.es, or by calling 956 143 3562. VAERS does not give medical advice. 6. The National Vaccine Injury Compensation Program The [employer] Vaccine Injury Compensation Program (VICP) is a federal program that was created to compensate people who may have been injured by certain vaccines. Persons who believe they may have been injured by a vaccine can learn about the program and about filing a claim by calling 740-371-1320 or visiting the [place] website at GoldCloset.com.ee. There is a time limit to file a claim for compensation. 7. How can I learn more?  Ask your healthcare provider. He or she can give you the  vaccine package insert or suggest other sources of information.  Call your local or state health department.  Contact the Centers for Disease Control and Prevention (CDC): ? Call (281)679-8387 (1-800-CDC-INFO) or ? Visit CDC's website at http://hunter.com/ Vaccine Information Statement PCV13 Vaccine (04/25/2014) This information is not intended to replace advice given to you by your health care provider. Make sure you discuss any questions you have with your health care provider. Document Released: 04/04/2006 Document Revised: 01/17/2018 Document Reviewed: 01/17/2018 Elsevier Interactive Patient Education  2019 [employer].

## 2018-07-27 LAB — LIPID PANEL
Chol/HDL Ratio: 3.5 ratio (ref 0.0–5.0)
Cholesterol, Total: 207 mg/dL — ABNORMAL HIGH (ref 100–199)
HDL: 59 mg/dL (ref >39)
LDL Calculated: 127 mg/dL — ABNORMAL HIGH (ref 0–99)
Triglycerides: 107 mg/dL (ref 0–149)
VLDL Cholesterol Cal: 21 mg/dL (ref 5–40)

## 2018-07-27 LAB — HEMOGLOBIN A1C
Est. average glucose Bld gHb Est-mCnc: 131 mg/dL
Hgb A1c MFr Bld: 6.2 % — ABNORMAL HIGH (ref 4.8–5.6)

## 2018-07-27 LAB — CBC
HEMOGLOBIN: 14.9 g/dL (ref 13.0–17.7)
Hematocrit: 44.9 % (ref 37.5–51.0)
MCH: 29.6 pg (ref 26.6–33.0)
MCHC: 33.2 g/dL (ref 31.5–35.7)
MCV: 89 fL (ref 79–97)
Platelets: 261 10*3/uL (ref 150–450)
RBC: 5.04 x10E6/uL (ref 4.14–5.80)
RDW: 12.7 % (ref 11.6–15.4)
WBC: 6.5 10*3/uL (ref 3.4–10.8)

## 2018-07-27 LAB — PSA: PROSTATE SPECIFIC AG, SERUM: 1 ng/mL (ref 0.0–4.0)

## 2018-07-27 LAB — HIV ANTIBODY (ROUTINE TESTING W REFLEX): HIV SCREEN 4TH GENERATION: NONREACTIVE

## 2018-08-17 ENCOUNTER — Telehealth: Payer: Self-pay

## 2018-08-17 NOTE — Telephone Encounter (Signed)
Call patient and make aware he will need an office visit to be evaluated for his cold symptoms.

## 2018-08-17 NOTE — Telephone Encounter (Signed)
Pt called wanting some med called in for a cold he can't get rid of. Pt had cold symptoms for about a week and nothing otc is working. Please advise . [place]

## 2018-08-18 NOTE — Telephone Encounter (Signed)
Called pt and was advised he would take the med he has . He was unable to take the appointment offered to him for Monday. [place]

## 2018-08-21 DIAGNOSIS — R52 Pain, unspecified: Secondary | ICD-10-CM | POA: Insufficient documentation

## 2018-08-21 DIAGNOSIS — R2232 Localized swelling, mass and lump, left upper limb: Secondary | ICD-10-CM | POA: Diagnosis not present

## 2018-08-25 ENCOUNTER — Other Ambulatory Visit: Payer: Self-pay | Admitting: Orthopedic Surgery

## 2018-08-28 ENCOUNTER — Other Ambulatory Visit: Payer: Self-pay | Admitting: Orthopedic Surgery

## 2018-08-29 ENCOUNTER — Other Ambulatory Visit: Payer: Self-pay | Admitting: Orthopedic Surgery

## 2018-08-29 DIAGNOSIS — R229 Localized swelling, mass and lump, unspecified: Principal | ICD-10-CM

## 2018-08-29 DIAGNOSIS — R52 Pain, unspecified: Secondary | ICD-10-CM

## 2018-08-29 DIAGNOSIS — IMO0002 Reserved for concepts with insufficient information to code with codable children: Secondary | ICD-10-CM

## 2018-09-08 ENCOUNTER — Other Ambulatory Visit: Payer: PRIVATE HEALTH INSURANCE

## 2018-10-15 ENCOUNTER — Other Ambulatory Visit: Payer: Self-pay | Admitting: Nurse Practitioner

## 2018-11-29 ENCOUNTER — Telehealth: Payer: Self-pay

## 2018-11-29 NOTE — Telephone Encounter (Signed)
Patient called stating he has a rash on the inside of his elbows that come and go and he has been putting on some cream but it is not working I gave pt an appointment and advised him to take a picture of the rash when it does appear so that when he comes to the office if the rash isnt there he can at least show Janece the rash. YRL,RMA

## 2018-12-04 ENCOUNTER — Other Ambulatory Visit: Payer: Self-pay

## 2018-12-04 ENCOUNTER — Encounter: Payer: Self-pay | Admitting: Nurse Practitioner

## 2018-12-04 ENCOUNTER — Ambulatory Visit (INDEPENDENT_AMBULATORY_CARE_PROVIDER_SITE_OTHER): Payer: Medicare Other | Admitting: Nurse Practitioner

## 2018-12-04 VITALS — BP 122/80 | HR 54 | Temp 98.4°F | Ht 68.0 in | Wt 186.4 lb

## 2018-12-04 DIAGNOSIS — R21 Rash and other nonspecific skin eruption: Secondary | ICD-10-CM

## 2018-12-04 MED ORDER — TRIAMCINOLONE ACETONIDE 0.5 % EX OINT: 1.0000 "application " | TOPICAL_OINTMENT | Freq: Two times a day (BID) | CUTANEOUS | 0 refills | Status: DC

## 2018-12-04 NOTE — Patient Instructions (Signed)

## 2018-12-04 NOTE — Progress Notes (Signed)
  Subjective:     Patient ID: Robert Donaldson , male    DOB: Nov 13, 1955 , 63 y.o.   MRN: [id number]   Chief Complaint  Patient presents with  . Rash    patient states his rash has been coming and going for the past couple months     HPI  A few times would have the rash on his legs.  His wife started using a new detergent called Borox.    Rash This is a recurrent problem. The current episode started 1 to 4 weeks ago. Location: bilateral elbow creases. The rash is characterized by itchiness. He was exposed to a new detergent/soap. Pertinent negatives include no anorexia, congestion, cough, facial edema, fever or shortness of breath. The treatment provided mild relief. There is no history of asthma.     Past Medical History:  Diagnosis Date  . HLD (hyperlipidemia)   . Vitamin D deficiency      Family History  Problem Relation Age of Onset  . Hyperlipidemia Sister      Current Outpatient Medications:  .  Nutritional Supplements (VITAMIN D BOOSTER PO), Take 1 capsule by mouth daily., Disp: , Rfl:  .  rosuvastatin (CRESTOR) 5 MG tablet, TAKE 1 TABLET DAILY, Disp: 90 tablet, Rfl: 3   No Known Allergies   Review of Systems  Constitutional: Negative for fever.  HENT: Negative for congestion.   Respiratory: Negative for cough and shortness of breath.   Cardiovascular: Negative.  Negative for chest pain, palpitations and leg swelling.  Gastrointestinal: Negative for anorexia.  Musculoskeletal: Negative.   Skin: Positive for rash.  Neurological: Negative for dizziness and headaches.     Today's Vitals   12/04/18 0942  BP: 122/80  Pulse: (!) 54  Temp: 98.4 F (36.9 C)  TempSrc: Oral  Weight: 186 lb 6.4 oz (84.6 kg)  Height: 5\' 8"  (1.727 m)  PainSc: 0-No pain   Body mass index is 28.34 kg/m.   Objective:  Physical Exam Constitutional:      Appearance: Normal appearance.  Cardiovascular:     Rate and Rhythm: Normal rate and regular rhythm.     Pulses: Normal pulses.      Heart sounds: Normal heart sounds.  Pulmonary:     Effort: Pulmonary effort is normal.     Breath sounds: Normal breath sounds.  Skin:    General: Skin is warm and dry.     Capillary Refill: Capillary refill takes less than 2 seconds.     Findings: Rash (bilateral creases of elbow) present.  Neurological:     General: No focal deficit present.     Mental Status: He is alert and oriented to person, place, and time.  Psychiatric:        Mood and Affect: Mood normal.        Behavior: Behavior normal.        Thought Content: Thought content normal.        Judgment: Judgment normal.         Assessment And Plan:     1. Rash and nonspecific skin eruption  Dry rash noted to bilateral creases of elbows contact dermatitis vs eczema  Advised to avoid irritant.   - triamcinolone ointment (KENALOG) 0.5 %; Apply 1 application topically 2 (two) times daily.  Dispense: 30 g; Refill: 0    Minette Brine, FNP    THE PATIENT IS ENCOURAGED TO PRACTICE SOCIAL DISTANCING DUE TO THE COVID-19 PANDEMIC.

## 2018-12-21 ENCOUNTER — Other Ambulatory Visit: Payer: Self-pay

## 2018-12-21 ENCOUNTER — Ambulatory Visit
Admission: RE | Admit: 2018-12-21 | Discharge: 2018-12-21 | Disposition: A | Discharge status: Home / Self Care | Payer: Medicare Other | Source: Ambulatory Visit | Attending: Orthopedic Surgery | Admitting: Orthopedic Surgery

## 2018-12-21 ENCOUNTER — Other Ambulatory Visit: Payer: Self-pay | Admitting: Orthopedic Surgery

## 2018-12-21 DIAGNOSIS — R52 Pain, unspecified: Secondary | ICD-10-CM

## 2018-12-21 DIAGNOSIS — IMO0002 Reserved for concepts with insufficient information to code with codable children: Secondary | ICD-10-CM

## 2018-12-21 DIAGNOSIS — R2232 Localized swelling, mass and lump, left upper limb: Secondary | ICD-10-CM | POA: Diagnosis not present

## 2018-12-27 DIAGNOSIS — M659 Synovitis and tenosynovitis, unspecified: Secondary | ICD-10-CM | POA: Insufficient documentation

## 2019-01-23 DIAGNOSIS — H524 Presbyopia: Secondary | ICD-10-CM | POA: Diagnosis not present

## 2019-01-23 DIAGNOSIS — H52223 Regular astigmatism, bilateral: Secondary | ICD-10-CM | POA: Diagnosis not present

## 2019-01-23 DIAGNOSIS — H5203 Hypermetropia, bilateral: Secondary | ICD-10-CM | POA: Diagnosis not present

## 2019-01-23 DIAGNOSIS — H40023 Open angle with borderline findings, high risk, bilateral: Secondary | ICD-10-CM | POA: Diagnosis not present

## 2019-01-23 DIAGNOSIS — H353112 Nonexudative age-related macular degeneration, right eye, intermediate dry stage: Secondary | ICD-10-CM | POA: Diagnosis not present

## 2019-01-23 DIAGNOSIS — H2513 Age-related nuclear cataract, bilateral: Secondary | ICD-10-CM | POA: Diagnosis not present

## 2019-01-24 ENCOUNTER — Ambulatory Visit (INDEPENDENT_AMBULATORY_CARE_PROVIDER_SITE_OTHER): Payer: Medicare Other | Admitting: Nurse Practitioner

## 2019-01-24 ENCOUNTER — Other Ambulatory Visit: Payer: Self-pay

## 2019-01-24 ENCOUNTER — Encounter: Payer: Self-pay | Admitting: Nurse Practitioner

## 2019-01-24 VITALS — BP 112/70 | HR 70 | Temp 97.9°F | Ht 68.0 in | Wt 180.0 lb

## 2019-01-24 DIAGNOSIS — E785 Hyperlipidemia, unspecified: Secondary | ICD-10-CM

## 2019-01-24 DIAGNOSIS — R7309 Other abnormal glucose: Secondary | ICD-10-CM | POA: Diagnosis not present

## 2019-01-24 LAB — CMP14 + ANION GAP
ALT: 29 IU/L (ref 0–44)
AST: 29 IU/L (ref 0–40)
Albumin/Globulin Ratio: 1.8 (ref 1.2–2.2)
Albumin: 4.4 g/dL (ref 3.8–4.8)
Alkaline Phosphatase: 59 IU/L (ref 39–117)
Anion Gap: 14 mmol/L (ref 10.0–18.0)
BUN/Creatinine Ratio: 8 — ABNORMAL LOW (ref 10–24)
BUN: 10 mg/dL (ref 8–27)
Bilirubin Total: 0.5 mg/dL (ref 0.0–1.2)
CO2: 19 mmol/L — ABNORMAL LOW (ref 20–29)
Calcium: 9.5 mg/dL (ref 8.6–10.2)
Chloride: 106 mmol/L (ref 96–106)
Creatinine, Ser: 1.25 mg/dL (ref 0.76–1.27)
GFR calc Af Amer: 69 mL/min/{1.73_m2} (ref >59)
GFR calc non Af Amer: 60 mL/min/{1.73_m2} (ref >59)
Globulin, Total: 2.4 g/dL (ref 1.5–4.5)
Glucose: 108 mg/dL — ABNORMAL HIGH (ref 65–99)
Potassium: 4.3 mmol/L (ref 3.5–5.2)
Sodium: 139 mmol/L (ref 134–144)
Total Protein: 6.8 g/dL (ref 6.0–8.5)

## 2019-01-24 LAB — HEMOGLOBIN A1C
Est. average glucose Bld gHb Est-mCnc: 131 mg/dL
Hgb A1c MFr Bld: 6.2 % — ABNORMAL HIGH (ref 4.8–5.6)

## 2019-01-24 LAB — LIPID PANEL
Chol/HDL Ratio: 3.3 ratio (ref 0.0–5.0)
Cholesterol, Total: 207 mg/dL — ABNORMAL HIGH (ref 100–199)
HDL: 63 mg/dL (ref >39)
LDL Calculated: 122 mg/dL — ABNORMAL HIGH (ref 0–99)
Triglycerides: 112 mg/dL (ref 0–149)
VLDL Cholesterol Cal: 22 mg/dL (ref 5–40)

## 2019-01-24 NOTE — Progress Notes (Signed)
  Subjective:     Patient ID: Robert Donaldson , male    DOB: [birth year] , 63 y.o.   MRN: [id number]   Chief Complaint  Patient presents with  . Hyperlipidemia    HPI  Hyperlipidemia - taking rosuvastatin daily  Hyperlipidemia This is a chronic problem. The current episode started more than 1 year ago. The problem is controlled. Recent lipid tests were reviewed and are high. He has no history of chronic renal disease. There are no known factors aggravating his hyperlipidemia. Pertinent negatives include no chest pain. Current antihyperlipidemic treatment includes exercise. There are no compliance problems.  Risk factors for coronary artery disease include male sex and dyslipidemia.     Past Medical History:  Diagnosis Date  . HLD (hyperlipidemia)   . Vitamin D deficiency      Family History  Problem Relation Age of Onset  . Hyperlipidemia Sister      Current Outpatient Medications:  .  Nutritional Supplements (VITAMIN D BOOSTER PO), Take 1 capsule by mouth daily., Disp: , Rfl:  .  rosuvastatin (CRESTOR) 5 MG tablet, TAKE 1 TABLET DAILY, Disp: 90 tablet, Rfl: 3 .  vitamin E 200 UNIT capsule, Take 200 Units by mouth daily., Disp: , Rfl:  .  triamcinolone ointment (KENALOG) 0.5 %, Apply 1 application topically 2 (two) times daily. (Patient not taking: Reported on 01/24/2019), Disp: 30 g, Rfl: 0   No Known Allergies   Review of Systems  Constitutional: Negative.   Respiratory: Negative.   Cardiovascular: Negative.  Negative for chest pain, palpitations and leg swelling.  Endocrine: Negative for polydipsia, polyphagia and polyuria.  Musculoskeletal: Negative.   Neurological: Negative for dizziness and headaches.     Today's Vitals   01/24/19 0842  BP: 112/70  Pulse: 70  Temp: 97.9 F (36.6 C)  TempSrc: Oral  Weight: 180 lb (81.6 kg)  Height: 5\' 8"  (1.727 m)  PainSc: 0-No pain   Body mass index is 27.37 kg/m.   Objective:  Physical Exam Constitutional:       General: He is not in acute distress.    Appearance: Normal appearance.  Cardiovascular:     Rate and Rhythm: Normal rate and regular rhythm.     Pulses: Normal pulses.     Heart sounds: Normal heart sounds. No murmur.  Pulmonary:     Effort: Pulmonary effort is normal.     Breath sounds: Normal breath sounds.  Musculoskeletal: Normal range of motion.  Skin:    General: Skin is warm and dry.     Capillary Refill: Capillary refill takes less than 2 seconds.  Neurological:     General: No focal deficit present.     Mental Status: He is alert and oriented to person, place, and time.         Assessment And Plan:     1. Hyperlipidemia, unspecified hyperlipidemia type  Chronic, fair controlled  Continue with current medications  2. Abnormal glucose  Chronic, controlled  No current medications  Encouraged to limit intake of sugary foods and drinks  Encouraged to increase physical activity to 150 minutes per week    Minette Brine, FNP    THE PATIENT IS ENCOURAGED TO PRACTICE SOCIAL DISTANCING DUE TO THE COVID-19 PANDEMIC.

## 2019-01-25 ENCOUNTER — Ambulatory Visit (INDEPENDENT_AMBULATORY_CARE_PROVIDER_SITE_OTHER): Payer: Medicare Other | Admitting: Nurse Practitioner

## 2019-01-25 ENCOUNTER — Encounter: Payer: Self-pay | Admitting: Nurse Practitioner

## 2019-01-25 VITALS — BP 110/70 | HR 58 | Temp 98.0°F | Ht 68.0 in | Wt 180.0 lb

## 2019-01-25 DIAGNOSIS — R229 Localized swelling, mass and lump, unspecified: Secondary | ICD-10-CM | POA: Insufficient documentation

## 2019-01-25 DIAGNOSIS — R222 Localized swelling, mass and lump, trunk: Secondary | ICD-10-CM | POA: Diagnosis not present

## 2019-01-25 NOTE — Progress Notes (Signed)
  Subjective:     Patient ID: Robert Donaldson , male    DOB: 1956/01/07 , 63 y.o.   MRN: [id number]   Chief Complaint  Patient presents with  . Mass    patient states he has a lump under his sternum that is bothering him. he stated when tried to do a sit up he felt the pain    HPI  Mass to chest near middle of chest.  Tender but not debilitating.  Just noticed yesterday after he left the office.   History of smoking up cigars and cigarettes in 70's.     Past Medical History:  Diagnosis Date  . HLD (hyperlipidemia)   . Vitamin D deficiency      Family History  Problem Relation Age of Onset  . Hyperlipidemia Sister      Current Outpatient Medications:  .  Nutritional Supplements (VITAMIN D BOOSTER PO), Take 1 capsule by mouth daily., Disp: , Rfl:  .  rosuvastatin (CRESTOR) 5 MG tablet, TAKE 1 TABLET DAILY, Disp: 90 tablet, Rfl: 3 .  vitamin E 200 UNIT capsule, Take 200 Units by mouth daily., Disp: , Rfl:  .  triamcinolone ointment (KENALOG) 0.5 %, Apply 1 application topically 2 (two) times daily. (Patient not taking: Reported on 01/24/2019), Disp: 30 g, Rfl: 0   No Known Allergies   Review of Systems  Constitutional: Negative.   Respiratory: Negative.   Cardiovascular: Negative.   Musculoskeletal:       Mass mid chest near xyphoid process.  Neurological: Negative for dizziness and headaches.     Today's Vitals   01/25/19 1145  BP: 110/70  Pulse: (!) 58  Temp: 98 F (36.7 C)  TempSrc: Oral  Weight: 180 lb (81.6 kg)  Height: 5\' 8"  (1.727 m)  PainSc: 0-No pain   Body mass index is 27.37 kg/m.   Objective:  Physical Exam Vitals signs reviewed.  Constitutional:      Appearance: Normal appearance.  Cardiovascular:     Rate and Rhythm: Normal rate and regular rhythm.     Pulses: Normal pulses.     Heart sounds: Normal heart sounds.  Pulmonary:     Effort: Pulmonary effort is normal.     Breath sounds: Normal breath sounds.  Musculoskeletal: Normal range of  motion.  Skin:    General: Skin is warm and dry.     Capillary Refill: Capillary refill takes less than 2 seconds.  Neurological:     Mental Status: He is alert.         Assessment And Plan:     1. Lump of skin  Firm area to midsternal area, non tender  Will check ultrasound of area.   Minette Brine, FNP    THE PATIENT IS ENCOURAGED TO PRACTICE SOCIAL DISTANCING DUE TO THE COVID-19 PANDEMIC.

## 2019-02-07 DIAGNOSIS — M659 Synovitis and tenosynovitis, unspecified: Secondary | ICD-10-CM | POA: Diagnosis not present

## 2019-02-21 ENCOUNTER — Ambulatory Visit
Admission: RE | Admit: 2019-02-21 | Discharge: 2019-02-21 | Disposition: A | Discharge status: Home / Self Care | Payer: Medicare Other | Source: Ambulatory Visit | Attending: Nurse Practitioner | Admitting: Nurse Practitioner

## 2019-02-21 DIAGNOSIS — R229 Localized swelling, mass and lump, unspecified: Secondary | ICD-10-CM

## 2019-03-05 ENCOUNTER — Other Ambulatory Visit: Payer: Self-pay | Admitting: Nurse Practitioner

## 2019-03-05 ENCOUNTER — Telehealth: Payer: Self-pay

## 2019-03-05 DIAGNOSIS — R229 Localized swelling, mass and lump, unspecified: Secondary | ICD-10-CM

## 2019-03-05 NOTE — Telephone Encounter (Signed)
Patient notified of his ultrasound results and he would like the referral a surgeon for further evaluation. I notified pt that the referral takes 7-10 business days and then someone will call him with an appointment date and time. YRL,RMA

## 2019-03-21 DIAGNOSIS — M25832 Other specified joint disorders, left wrist: Secondary | ICD-10-CM | POA: Insufficient documentation

## 2019-03-21 DIAGNOSIS — M659 Synovitis and tenosynovitis, unspecified: Secondary | ICD-10-CM | POA: Diagnosis not present

## 2019-03-26 ENCOUNTER — Other Ambulatory Visit: Payer: Self-pay | Admitting: Orthopedic Surgery

## 2019-03-26 ENCOUNTER — Encounter: Payer: Self-pay | Admitting: Nurse Practitioner

## 2019-03-26 DIAGNOSIS — M25832 Other specified joint disorders, left wrist: Secondary | ICD-10-CM

## 2019-03-30 DIAGNOSIS — M6208 Separation of muscle (nontraumatic), other site: Secondary | ICD-10-CM | POA: Diagnosis not present

## 2019-03-30 DIAGNOSIS — D171 Benign lipomatous neoplasm of skin and subcutaneous tissue of trunk: Secondary | ICD-10-CM | POA: Diagnosis not present

## 2019-04-09 ENCOUNTER — Ambulatory Visit
Admission: RE | Admit: 2019-04-09 | Discharge: 2019-04-09 | Disposition: A | Discharge status: Home / Self Care | Payer: Medicare Other | Source: Ambulatory Visit | Attending: Orthopedic Surgery | Admitting: Orthopedic Surgery

## 2019-04-09 DIAGNOSIS — M25532 Pain in left wrist: Secondary | ICD-10-CM | POA: Diagnosis not present

## 2019-04-09 DIAGNOSIS — M25832 Other specified joint disorders, left wrist: Secondary | ICD-10-CM

## 2019-04-09 MED ORDER — IOPAMIDOL (ISOVUE-M 200) INJECTION 41%
2.0000 mL | Freq: Once | INTRAMUSCULAR | Status: AC
  Administered 2019-04-09: 16:00:00 2 mL via INTRA_ARTICULAR

## 2019-04-13 DIAGNOSIS — M659 Synovitis and tenosynovitis, unspecified: Secondary | ICD-10-CM | POA: Diagnosis not present

## 2019-04-13 DIAGNOSIS — M25832 Other specified joint disorders, left wrist: Secondary | ICD-10-CM | POA: Diagnosis not present

## 2019-05-10 ENCOUNTER — Encounter: Payer: Self-pay | Admitting: Nurse Practitioner

## 2019-05-13 DIAGNOSIS — R82998 Other abnormal findings in urine: Secondary | ICD-10-CM | POA: Diagnosis not present

## 2019-05-13 DIAGNOSIS — R300 Dysuria: Secondary | ICD-10-CM | POA: Diagnosis not present

## 2019-05-13 DIAGNOSIS — A499 Bacterial infection, unspecified: Secondary | ICD-10-CM | POA: Diagnosis not present

## 2019-05-13 DIAGNOSIS — N390 Urinary tract infection, site not specified: Secondary | ICD-10-CM | POA: Diagnosis not present

## 2019-05-16 ENCOUNTER — Encounter: Payer: Self-pay | Admitting: Nurse Practitioner

## 2019-05-22 ENCOUNTER — Encounter: Payer: Self-pay | Admitting: Nurse Practitioner

## 2019-05-22 ENCOUNTER — Other Ambulatory Visit: Payer: Self-pay

## 2019-05-22 ENCOUNTER — Ambulatory Visit (INDEPENDENT_AMBULATORY_CARE_PROVIDER_SITE_OTHER): Payer: Medicare Other | Admitting: Nurse Practitioner

## 2019-05-22 VITALS — BP 122/80 | HR 56 | Temp 97.8°F | Ht 68.0 in | Wt 186.2 lb

## 2019-05-22 DIAGNOSIS — Z23 Encounter for immunization: Secondary | ICD-10-CM | POA: Diagnosis not present

## 2019-05-22 DIAGNOSIS — M79671 Pain in right foot: Secondary | ICD-10-CM

## 2019-05-22 NOTE — Patient Instructions (Signed)
Influenza Virus Vaccine (Flucelvax) What is this medicine? INFLUENZA VIRUS VACCINE (in floo EN zuh VAHY ruhs vak SEEN) helps to reduce the risk of getting influenza also known as the flu. The vaccine only helps protect you against some strains of the flu. This medicine may be used for other purposes; ask your health care provider or pharmacist if you have questions. COMMON BRAND NAME(S): FLUCELVAX What should I tell my health care provider before I take this medicine? They need to know if you have any of these conditions:  bleeding disorder like hemophilia  fever or infection  Guillain-Barre syndrome or other neurological problems  immune system problems  infection with the human immunodeficiency virus (HIV) or AIDS  low blood platelet counts  multiple sclerosis  an unusual or allergic reaction to influenza virus vaccine, other medicines, foods, dyes or preservatives  pregnant or trying to get pregnant  breast-feeding How should I use this medicine? This vaccine is for injection into a muscle. It is given by a health care professional. A copy of Vaccine Information Statements will be given before each vaccination. Read this sheet carefully each time. The sheet may change frequently. Talk to your pediatrician regarding the use of this medicine in children. Special care may be needed. Overdosage: If you think you've taken too much of this medicine contact a poison control center or emergency room at once. Overdosage: If you think you have taken too much of this medicine contact a poison control center or emergency room at once. NOTE: This medicine is only for you. Do not share this medicine with others. What if I miss a dose? This does not apply. What may interact with this medicine?  chemotherapy or radiation therapy  medicines that lower your immune system like etanercept, anakinra, infliximab, and adalimumab  medicines that treat or prevent blood clots like  warfarin  phenytoin  steroid medicines like prednisone or cortisone  theophylline  vaccines This list may not describe all possible interactions. Give your health care provider a list of all the medicines, herbs, non-prescription drugs, or dietary supplements you use. Also tell them if you smoke, drink alcohol, or use illegal drugs. Some items may interact with your medicine. What should I watch for while using this medicine? Report any side effects that do not go away within 3 days to your doctor or health care professional. Call your health care provider if any unusual symptoms occur within 6 weeks of receiving this vaccine. You may still catch the flu, but the illness is not usually as bad. You cannot get the flu from the vaccine. The vaccine will not protect against colds or other illnesses that may cause fever. The vaccine is needed every year. What side effects may I notice from receiving this medicine? Side effects that you should report to your doctor or health care professional as soon as possible:  allergic reactions like skin rash, itching or hives, swelling of the face, lips, or tongue Side effects that usually do not require medical attention (Report these to your doctor or health care professional if they continue or are bothersome.):  fever  headache  muscle aches and pains  pain, tenderness, redness, or swelling at the injection site  tiredness This list may not describe all possible side effects. Call your doctor for medical advice about side effects. You may report side effects to FDA at 1-800-FDA-1088. Where should I keep my medicine? The vaccine will be given by a health care professional in a clinic, pharmacy, doctor's   office, or other health care setting. You will not be given vaccine doses to store at home. NOTE: This sheet is a summary. It may not cover all possible information. If you have questions about this medicine, talk to your doctor, pharmacist, or  health care provider.  2020 Elsevier/Gold Standard (2011-05-19 14:06:47)  

## 2019-05-22 NOTE — Progress Notes (Signed)
This visit occurred during the SARS-CoV-2 public health emergency.  Safety protocols were in place, including screening questions prior to the visit, additional usage of staff PPE, and extensive cleaning of exam room while observing appropriate contact time as indicated for disinfecting solutions.  Subjective:     Patient ID: Robert Donaldson , male    DOB: 1956/01/24 , 63 y.o.   MRN: [id number]   Chief Complaint  Patient presents with  . Foot Pain    patient stated his foot has been hurting for the past couple of weeks. he is wanting a referral to either a podiatrist or orthopaedic    HPI  Having right foot pain when putting pressure on toe.  Feels like has socks underneath his toes.  He has been to [facility]   Foot Pain This is a new problem. The current episode started 1 to 4 weeks ago (1-2 weeks ago). The problem occurs intermittently. The problem has been unchanged. Pertinent negatives include no chest pain, numbness, rash or weakness. Treatments tried: foot/achilles exercises.     Past Medical History:  Diagnosis Date  . HLD (hyperlipidemia)   . Vitamin D deficiency      Family History  Problem Relation Age of Onset  . Hyperlipidemia Sister      Current Outpatient Medications:  .  Nutritional Supplements (VITAMIN D BOOSTER PO), Take 1 capsule by mouth daily., Disp: , Rfl:  .  rosuvastatin (CRESTOR) 5 MG tablet, TAKE 1 TABLET DAILY, Disp: 90 tablet, Rfl: 3 .  vitamin E 200 UNIT capsule, Take 200 Units by mouth daily., Disp: , Rfl:  .  triamcinolone ointment (KENALOG) 0.5 %, Apply 1 application topically 2 (two) times daily. (Patient not taking: Reported on 01/24/2019), Disp: 30 g, Rfl: 0   No Known Allergies   Review of Systems  Constitutional: Negative.   Respiratory: Negative.   Cardiovascular: Negative.  Negative for chest pain, palpitations and leg swelling.  Musculoskeletal:       Right dorsal surface of great toe tender at ball of foot  Skin:  Negative for rash.  Neurological: Negative for dizziness, weakness and numbness.     Today's Vitals   05/22/19 1100  BP: 122/80  Pulse: (!) 56  Temp: 97.8 F (36.6 C)  TempSrc: Oral  Weight: 186 lb 3.2 oz (84.5 kg)  Height: 5\' 8"  (1.727 m)  PainSc: 2   PainLoc: Foot   Body mass index is 28.31 kg/m.   Objective:  Physical Exam Constitutional:      General: He is not in acute distress.    Appearance: Normal appearance.  Cardiovascular:     Rate and Rhythm: Normal rate and regular rhythm.     Pulses: Normal pulses.     Heart sounds: Normal heart sounds. No murmur.  Pulmonary:     Effort: Pulmonary effort is normal. No respiratory distress.     Breath sounds: Normal breath sounds.  Musculoskeletal:        General: Tenderness (base of toes of right foot) present. No swelling or deformity.     Right lower leg: No edema.     Left lower leg: No edema.     Comments: Pain to right foot at base of toes when bending  Skin:    Capillary Refill: Capillary refill takes less than 2 seconds.  Neurological:     Mental Status: He is alert.  Psychiatric:        Mood and Affect: Mood normal.  Behavior: Behavior normal.        Thought Content: Thought content normal.        Judgment: Judgment normal.         Assessment And Plan:     1. Right foot pain  Pain to base of toes when bending  No swelling noted  Would like referral to specialist - Ambulatory referral to Podiatry  2. Need for influenza vaccination  Influenza vaccine given in office  Advised to take Tylenol as needed for muscle aches or fever - Flu vaccine HIGH DOSE PF (Fluzone High dose)   Minette Brine, FNP    THE PATIENT IS ENCOURAGED TO PRACTICE SOCIAL DISTANCING DUE TO THE COVID-19 PANDEMIC.

## 2019-05-25 DIAGNOSIS — M659 Synovitis and tenosynovitis, unspecified: Secondary | ICD-10-CM | POA: Diagnosis not present

## 2019-05-29 ENCOUNTER — Encounter: Payer: Self-pay | Admitting: Nurse Practitioner

## 2019-06-12 ENCOUNTER — Ambulatory Visit (INDEPENDENT_AMBULATORY_CARE_PROVIDER_SITE_OTHER): Payer: Medicare Other

## 2019-06-12 ENCOUNTER — Ambulatory Visit (INDEPENDENT_AMBULATORY_CARE_PROVIDER_SITE_OTHER): Payer: Medicare Other | Admitting: Sports Medicine

## 2019-06-12 ENCOUNTER — Other Ambulatory Visit: Payer: Self-pay | Admitting: Sports Medicine

## 2019-06-12 ENCOUNTER — Other Ambulatory Visit: Payer: Self-pay

## 2019-06-12 ENCOUNTER — Encounter: Payer: Self-pay | Admitting: Sports Medicine

## 2019-06-12 VITALS — BP 117/64 | HR 59 | Temp 97.4°F | Resp 16

## 2019-06-12 DIAGNOSIS — M2142 Flat foot [pes planus] (acquired), left foot: Secondary | ICD-10-CM

## 2019-06-12 DIAGNOSIS — M2141 Flat foot [pes planus] (acquired), right foot: Secondary | ICD-10-CM | POA: Diagnosis not present

## 2019-06-12 DIAGNOSIS — M19071 Primary osteoarthritis, right ankle and foot: Secondary | ICD-10-CM

## 2019-06-12 DIAGNOSIS — M2041 Other hammer toe(s) (acquired), right foot: Secondary | ICD-10-CM

## 2019-06-12 DIAGNOSIS — M19072 Primary osteoarthritis, left ankle and foot: Secondary | ICD-10-CM

## 2019-06-12 DIAGNOSIS — M779 Enthesopathy, unspecified: Secondary | ICD-10-CM

## 2019-06-12 DIAGNOSIS — M79671 Pain in right foot: Secondary | ICD-10-CM

## 2019-06-12 DIAGNOSIS — M19079 Primary osteoarthritis, unspecified ankle and foot: Secondary | ICD-10-CM

## 2019-06-12 NOTE — Progress Notes (Signed)
Subjective: Robert Donaldson is a 63 y.o. male patient who presents to office for evaluation of right foot pain. Patient complains of progressive pain especially over the last 2 months and thinks its from how he was stretching his plantar fascia. Ranks pain 1/10 and wanted to have it checked. Patient also has noticed the 2nd toe changing. Patient denies any other pedal complaints. Denies injury/trip/fall/sprain/any causative factors.   Review of Systems  All other systems reviewed and are negative.    Patient Active Problem List   Diagnosis Date Noted  . 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 Lump of skin 01/25/2019  . Tenosynovitis 12/27/2018  . Pain 08/21/2018  . Abnormal glucose 07/26/2018  . Left wrist pain 07/26/2018  . Hyperlipidemia 07/26/2018  . Nocturia 07/26/2018    Current Outpatient Medications on File Prior to Visit  Medication Sig Dispense Refill  . meloxicam (MOBIC) 7.5 MG tablet Take by mouth.    . Nutritional Supplements (VITAMIN D BOOSTER PO) Take 1 capsule by mouth daily.    . rosuvastatin (CRESTOR) 5 MG tablet TAKE 1 TABLET DAILY 90 tablet 3  . triamcinolone ointment (KENALOG) 0.5 % Apply 1 application topically 2 (two) times daily. 30 g 0  . vitamin E 200 UNIT capsule Take 200 Units by mouth daily.     No current facility-administered medications on file prior to visit.    No Known Allergies  Objective:  General: Alert and oriented x3 in no acute distress  Dermatology: No open lesions bilateral lower extremities, no webspace macerations, no ecchymosis bilateral, all nails x 10 are well manicured.  Vascular: Dorsalis Pedis and Posterior Tibial pedal pulses palpable, Capillary Fill Time 3 seconds,(+) pedal hair growth bilateral, no edema bilateral lower extremities, Temperature gradient within normal limits.  Neurology: Johney Maine sensation intact via light touch bilateral.  Musculoskeletal: Mild tenderness with palpation at 1st MTPJ with End range of  motion and palpation of sesamoids on the right,No pain with calf compression bilateral. There is decreased ankle rom with knee extending  vs flexed resembling gastroc equnius bilateral, Subtalar joint range of motion is within normal limits, there is no 1st ray hypermobility noted bilateral, decreased 1st MPJ rom Right>Left with functional limitus noted on weightbearing exam. + Pes planus. Hammertoe Right>left 2nd toes. Strength within normal limits in all groups bilateral.   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 Inferior heel spur and ankle arthritis. No other findings.   Assessment and Plan: Problem List Items Addressed This Visit    None    Visit Diagnoses    Right foot pain    -  Primary   Relevant Orders   DG Foot Complete Right   Capsulitis       Arthritis of metatarsophalangeal (MTP) joint of great toe       Relevant Medications   meloxicam (MOBIC) 7.5 MG tablet   Hammertoe of second toe of right foot       Pes planus of both feet           -Complete examination performed -Xrays reviewed -Discussed treatement options for capsulitis and arthritis aggrevated by overstretching  -Advised patient to avoid over stretching -Advised patient to continue with Mobic  -Advised OTC topical pain cream and soaking with Epsom salt as directed -Continue with good supportive shoes and [facility] -Patient to return to office as needed or  sooner if condition worsens. Advised patient if continues to be painful may benefit from steroid injection.  Landis Martins, DPM

## 2019-07-06 DIAGNOSIS — M25832 Other specified joint disorders, left wrist: Secondary | ICD-10-CM | POA: Diagnosis not present

## 2019-07-06 DIAGNOSIS — M659 Synovitis and tenosynovitis, unspecified: Secondary | ICD-10-CM | POA: Diagnosis not present

## 2019-07-27 DIAGNOSIS — H2513 Age-related nuclear cataract, bilateral: Secondary | ICD-10-CM | POA: Diagnosis not present

## 2019-07-27 DIAGNOSIS — H40023 Open angle with borderline findings, high risk, bilateral: Secondary | ICD-10-CM | POA: Diagnosis not present

## 2019-07-27 DIAGNOSIS — H35041 Retinal micro-aneurysms, unspecified, right eye: Secondary | ICD-10-CM | POA: Diagnosis not present

## 2019-07-27 DIAGNOSIS — H353112 Nonexudative age-related macular degeneration, right eye, intermediate dry stage: Secondary | ICD-10-CM | POA: Diagnosis not present

## 2019-08-01 ENCOUNTER — Encounter: Payer: Self-pay | Admitting: Nurse Practitioner

## 2019-08-01 ENCOUNTER — Ambulatory Visit (INDEPENDENT_AMBULATORY_CARE_PROVIDER_SITE_OTHER): Payer: Medicare Other

## 2019-08-01 ENCOUNTER — Ambulatory Visit (INDEPENDENT_AMBULATORY_CARE_PROVIDER_SITE_OTHER): Payer: Medicare Other | Admitting: Nurse Practitioner

## 2019-08-01 ENCOUNTER — Other Ambulatory Visit: Payer: Self-pay

## 2019-08-01 VITALS — BP 124/78 | HR 50 | Temp 98.4°F | Ht 68.0 in | Wt 184.0 lb

## 2019-08-01 DIAGNOSIS — R7309 Other abnormal glucose: Secondary | ICD-10-CM

## 2019-08-01 DIAGNOSIS — Z0000 Encounter for general adult medical examination without abnormal findings: Secondary | ICD-10-CM

## 2019-08-01 DIAGNOSIS — E785 Hyperlipidemia, unspecified: Secondary | ICD-10-CM

## 2019-08-01 NOTE — Patient Instructions (Signed)
   Take over the counter benefiber 1 tsp daily   Increase your water intake to at least one gallon a day.

## 2019-08-01 NOTE — Progress Notes (Signed)
Subjective:     Patient ID: Robert Donaldson , male    DOB: 1955/10/30 , 64 y.o.   MRN: [id number]   No chief complaint on file.   HPI  Hyperlipidemia - taking rosuvastatin daily  He received his first covid vaccine approximately 2 weeks ago, 2nd one will be on Monday   Hyperlipidemia This is a chronic problem. The current episode started more than 1 year ago. The problem is controlled. Recent lipid tests were reviewed and are high. He has no history of chronic renal disease. There are no known factors aggravating his hyperlipidemia. Pertinent negatives include no chest pain. Current antihyperlipidemic treatment includes exercise. There are no compliance problems.  Risk factors for coronary artery disease include male sex and dyslipidemia.     Past Medical History:  Diagnosis Date  . HLD (hyperlipidemia)   . Vitamin D deficiency      Family History  Problem Relation Age of Onset  . Hyperlipidemia Sister      Current Outpatient Medications:  .  meloxicam (MOBIC) 7.5 MG tablet, Take by mouth., Disp: , Rfl:  .  Nutritional Supplements (VITAMIN D BOOSTER PO), Take 1 capsule by mouth daily., Disp: , Rfl:  .  rosuvastatin (CRESTOR) 5 MG tablet, TAKE 1 TABLET DAILY, Disp: 90 tablet, Rfl: 3 .  triamcinolone ointment (KENALOG) 0.5 %, Apply 1 application topically 2 (two) times daily., Disp: 30 g, Rfl: 0 .  vitamin E 200 UNIT capsule, Take 200 Units by mouth daily., Disp: , Rfl:    No Known Allergies   Review of Systems  Constitutional: Negative.   Respiratory: Negative.   Cardiovascular: Negative.  Negative for chest pain, palpitations and leg swelling.  Endocrine: Negative for polydipsia, polyphagia and polyuria.  Musculoskeletal: Negative.   Neurological: Negative for dizziness and headaches.  Psychiatric/Behavioral: Negative.      There were no vitals filed for this visit. There is no height or weight on file to calculate BMI.   Objective:  Physical Exam Constitutional:       General: He is not in acute distress.    Appearance: Normal appearance.  Cardiovascular:     Rate and Rhythm: Normal rate and regular rhythm.     Pulses: Normal pulses.     Heart sounds: Normal heart sounds. No murmur.  Pulmonary:     Effort: Pulmonary effort is normal. No respiratory distress.     Breath sounds: Normal breath sounds.  Musculoskeletal:        General: Normal range of motion.  Skin:    General: Skin is warm and dry.     Capillary Refill: Capillary refill takes less than 2 seconds.  Neurological:     General: No focal deficit present.     Mental Status: He is alert and oriented to person, place, and time.  Psychiatric:        Mood and Affect: Mood normal.        Behavior: Behavior normal.        Thought Content: Thought content normal.        Judgment: Judgment normal.         Assessment And Plan:     1. Hyperlipidemia, unspecified hyperlipidemia type  Chronic, fair controlled  Continue with current medications  2. Abnormal glucose  Chronic, controlled  No current medications  Encouraged to limit intake of sugary foods and drinks  Encouraged to increase physical activity to 150 minutes per week   Minette Brine, FNP    THE PATIENT IS  ENCOURAGED TO PRACTICE SOCIAL DISTANCING DUE TO THE COVID-19 PANDEMIC.   

## 2019-08-01 NOTE — Progress Notes (Signed)
This visit occurred during the SARS-CoV-2 public health emergency.  Safety protocols were in place, including screening questions prior to the visit, additional usage of staff PPE, and extensive cleaning of exam room while observing appropriate contact time as indicated for disinfecting solutions.  Subjective:   Robert Donaldson is a 64 y.o. male who presents for Medicare Annual/Subsequent preventive examination.  Review of Systems:  n/a Cardiac Risk Factors include: advanced age (>41men, >72 women);dyslipidemia;male gender     Objective:    Vitals: BP 124/78 (BP Location: Right Arm, Patient Position: Sitting, Cuff Size: Normal)   Pulse (!) 50   Temp 98.4 F (36.9 C) (Oral)   Ht 5\' 8"  (1.727 m)   Wt 184 lb (83.5 kg)   SpO2 93%   BMI 27.98 kg/m   Body mass index is 27.98 kg/m.  Advanced Directives 08/01/2019 07/26/2018 07/26/2018 04/06/2015  Does Patient Have a Medical Advance Directive? Yes - No No  Type of Paramedic of [place];Living will - - -  Copy of [place] in Chart? No - copy requested - - -  Would patient like information on creating a medical advance directive? - Yes (MAU/Ambulatory/Procedural Areas - Information given) Yes (MAU/Ambulatory/Procedural Areas - Information given) No - patient declined information    Tobacco Social History   Tobacco Use  Smoking Status Former Smoker  . Types: Cigarettes  Smokeless Tobacco Never Used     Counseling given: Not Answered   Clinical Intake:  Pre-visit preparation completed: Yes  Pain : No/denies pain     Nutritional Status: BMI 25 -29 Overweight Nutritional Risks: None Diabetes: No  How often do you need to have someone help you when you read instructions, pamphlets, or other written materials from your doctor or pharmacy?: 1 - Never What is the last grade level you completed in school?: graduate school  Interpreter Needed?: No  Information entered by :: NAllen  LPN  Past Medical History:  Diagnosis Date  . HLD (hyperlipidemia)   . Vitamin D deficiency    Past Surgical History:  Procedure Laterality Date  . KNEE SURGERY     Family History  Problem Relation Age of Onset  . Hyperlipidemia Sister    Social History   Socioeconomic History  . Marital status: Married    Spouse name: Not on file  . Number of children: Not on file  . Years of education: Not on file  . Highest education level: Not on file  Occupational History  . Occupation: retired  Tobacco Use  . Smoking status: Former Smoker    Types: Cigarettes  . Smokeless tobacco: Never Used  Substance and Sexual Activity  . Alcohol use: Not Currently    Comment: occassionally  . Drug use: No  . Sexual activity: Yes  Other Topics Concern  . Not on file  Social History Narrative  . Not on file   Social Determinants of Health   Financial Resource Strain: Low Risk   . Difficulty of Paying Living Expenses: Not hard at all  Food Insecurity: No Food Insecurity  . Worried About Charity fundraiser in the Last Year: Never true  . Ran Out of Food in the Last Year: Never true  Transportation Needs: No Transportation Needs  . Lack of Transportation (Medical): No  . Lack of Transportation (Non-Medical): No  Physical Activity: Sufficiently Active  . Days of Exercise per Week: 6 days  . Minutes of Exercise per Session: 120 min  Stress: No Stress  Concern Present  . Feeling of Stress : Not at all  Social Connections:   . Frequency of Communication with Friends and Family: Not on file  . Frequency of Social Gatherings with Friends and Family: Not on file  . Attends Religious Services: Not on file  . Active Member of Clubs or Organizations: Not on file  . Attends Archivist Meetings: Not on file  . Marital Status: Not on file    Outpatient Encounter Medications as of 08/01/2019  Medication Sig  . meloxicam (MOBIC) 7.5 MG tablet Take by mouth.  . Nutritional Supplements  (VITAMIN D BOOSTER PO) Take 1 capsule by mouth daily.  . rosuvastatin (CRESTOR) 5 MG tablet TAKE 1 TABLET DAILY  . triamcinolone ointment (KENALOG) 0.5 % Apply 1 application topically 2 (two) times daily.  . vitamin E 200 UNIT capsule Take 200 Units by mouth daily.   No facility-administered encounter medications on file as of 08/01/2019.    Activities of Daily Living In your present state of health, do you have any difficulty performing the following activities: 08/01/2019  Hearing? N  Vision? N  Difficulty concentrating or making decisions? N  Walking or climbing stairs? N  Dressing or bathing? N  Doing errands, shopping? N  Preparing Food and eating ? N  Using the Toilet? N  In the past six months, have you accidently leaked urine? N  Do you have problems with loss of bowel control? N  Managing your Medications? N  Managing your Finances? N  Housekeeping or managing your Housekeeping? N  Some recent data might be hidden    Patient Care Team: Minette Brine, FNP as PCP - General (General Practice)   Assessment:   This is a routine wellness examination for [facility].  Exercise Activities and Dietary recommendations Current Exercise Habits: Home exercise routine, Type of exercise: strength training/weights;Other - see comments(stationary bike), Time (Minutes): > 60, Frequency (Times/Week): 6, Weekly Exercise (Minutes/Week): 0  Goals    . Patient Stated     08/01/2019 wants to lose body fat    . Weight (lb) < 170 lb (77.1 kg)     I would like to lose my belly fat       Fall Risk Fall Risk  08/01/2019 05/22/2019 01/24/2019 12/04/2018  Falls in the past year? 0 0 0 0  Follow up Falls evaluation completed;Education provided;Falls prevention discussed - - -   Is the patient's home free of loose throw rugs in walkways, pet beds, electrical cords, etc?   yes      Grab bars in the bathroom? yes      Handrails on the stairs?   yes      Adequate lighting?   yes  Timed Get Up and Go  Performed: n/a  Depression Screen PHQ 2/9 Scores 08/01/2019 05/22/2019 01/24/2019 12/04/2018  PHQ - 2 Score 0 0 0 0  PHQ- 9 Score 0 - - -    Cognitive Function MMSE - Mini Mental State Exam 07/26/2018  Orientation to time 5  Orientation to Place 5  Registration 3  Attention/ Calculation 5  Recall 3  Language- name 2 objects 2  Language- repeat 1  Language- follow 3 step command 3  Language- read & follow direction 1  Write a sentence 1     6CIT Screen 08/01/2019  What Year? 0 points  What month? 0 points  What time? 0 points  Count back from 20 0 points  Months in reverse 0 points  Repeat  phrase 0 points  Total Score 0    Immunization History  Administered Date(s) Administered  . Influenza, High Dose Seasonal PF 05/22/2019  . Pneumococcal Polysaccharide-23 07/26/2018  . Tdap 04/06/2015    Qualifies for Shingles Vaccine? yes  Screening Tests Health Maintenance  Topic Date Due  . COLONOSCOPY  06/22/2003  . PNA vac Low Risk Adult (2 of 2 - PCV13) 07/27/2019  . TETANUS/TDAP  04/05/2025  . INFLUENZA VACCINE  Completed  . Hepatitis C Screening  Completed   Cancer Screenings: Lung: Low Dose CT Chest recommended if Age 73-80 years, 30 pack-year currently smoking OR have quit w/in 15years. Patient does not qualify. Colorectal: up to date per patient  Additional Screenings:  Hepatitis C Screening:01/2018      Plan:    Patient would like to lose body fat.  I have personally reviewed and noted the following in the patient's chart:   . Medical and social history . Use of alcohol, tobacco or illicit drugs  . Current medications and supplements . Functional ability and status . Nutritional status . Physical activity . Advanced directives . List of other physicians . Hospitalizations, surgeries, and ER visits in previous 12 months . Vitals . Screenings to include cognitive, depression, and falls . Referrals and appointments  In addition, I have reviewed and  discussed with patient certain preventive protocols, quality metrics, and best practice recommendations. A written personalized care plan for preventive services as well as general preventive health recommendations were provided to patient.     Kellie Simmering, LPN  [id number]

## 2019-08-01 NOTE — Patient Instructions (Signed)
[name] , Thank you for taking time to come for your Medicare Wellness Visit. I appreciate your ongoing commitment to your health goals. Please review the following plan we discussed and let me know if I can assist you in the future.   Screening recommendations/referrals: Colonoscopy: requesting report Recommended yearly ophthalmology/optometry visit for glaucoma screening and checkup Recommended yearly dental visit for hygiene and checkup  Vaccinations: Influenza vaccine: 05/2019 Pneumococcal vaccine: 07/2018 Tdap vaccine: 03/2015 Shingles vaccine: discussed    Advanced directives: Please bring a copy of your POA (Power of [place]) and/or Living Will to your next appointment.    Conditions/risks identified: overweight  Next appointment:   Preventive Care 66 Years and Older, Male Preventive care refers to lifestyle choices and visits with your health care provider that can promote health and wellness. What does preventive care include?  A yearly physical exam. This is also called an annual well check.  Dental exams once or twice a year.  Routine eye exams. Ask your health care provider how often you should have your eyes checked.  Personal lifestyle choices, including:  Daily care of your teeth and gums.  Regular physical activity.  Eating a healthy diet.  Avoiding tobacco and drug use.  Limiting alcohol use.  Practicing safe sex.  Taking low doses of aspirin every day.  Taking vitamin and mineral supplements as recommended by your health care provider. What happens during an annual well check? The services and screenings done by your health care provider during your annual well check will depend on your age, overall health, lifestyle risk factors, and family history of disease. Counseling  Your health care provider may ask you questions about your:  Alcohol use.  Tobacco use.  Drug use.  Emotional well-being.  Home and relationship  well-being.  Sexual activity.  Eating habits.  History of falls.  Memory and ability to understand (cognition).  Work and work Statistician. Screening  You may have the following tests or measurements:  Height, weight, and BMI.  Blood pressure.  Lipid and cholesterol levels. These may be checked every 5 years, or more frequently if you are over 41 years old.  Skin check.  Lung cancer screening. You may have this screening every year starting at age 6 if you have a 30-pack-year history of smoking and currently smoke or have quit within the past 15 years.  Fecal occult blood test (FOBT) of the stool. You may have this test every year starting at age 62.  Flexible sigmoidoscopy or colonoscopy. You may have a sigmoidoscopy every 5 years or a colonoscopy every 10 years starting at age 15.  Prostate cancer screening. Recommendations will vary depending on your family history and other risks.  Hepatitis C blood test.  Hepatitis B blood test.  Sexually transmitted disease (STD) testing.  Diabetes screening. This is done by checking your blood sugar (glucose) after you have not eaten for a while (fasting). You may have this done every 1-3 years.  Abdominal aortic aneurysm (AAA) screening. You may need this if you are a current or former smoker.  Osteoporosis. You may be screened starting at age 52 if you are at high risk. Talk with your health care provider about your test results, treatment options, and if necessary, the need for more tests. Vaccines  Your health care provider may recommend certain vaccines, such as:  Influenza vaccine. This is recommended every year.  Tetanus, diphtheria, and acellular pertussis (Tdap, Td) vaccine. You may need a Td booster every 10  years.  Zoster vaccine. You may need this after age 32.  Pneumococcal 13-valent conjugate (PCV13) vaccine. One dose is recommended after age 27.  Pneumococcal polysaccharide (PPSV23) vaccine. One dose is  recommended after age 67. Talk to your health care provider about which screenings and vaccines you need and how often you need them. This information is not intended to replace advice given to you by your health care provider. Make sure you discuss any questions you have with your health care provider. Document Released: 07/04/2015 Document Revised: 02/25/2016 Document Reviewed: 04/08/2015 Elsevier Interactive Patient Education  2017 [place] Prevention in the Home Falls can cause injuries. They can happen to people of all ages. There are many things you can do to make your home safe and to help prevent falls. What can I do on the outside of my home?  Regularly fix the edges of walkways and driveways and fix any cracks.  Remove anything that might make you trip as you walk through a door, such as a raised step or threshold.  Trim any bushes or trees on the path to your home.  Use bright outdoor lighting.  Clear any walking paths of anything that might make someone trip, such as rocks or tools.  Regularly check to see if handrails are loose or broken. Make sure that both sides of any steps have handrails.  Any raised decks and porches should have guardrails on the edges.  Have any leaves, snow, or ice cleared regularly.  Use sand or salt on walking paths during winter.  Clean up any spills in your garage right away. This includes oil or grease spills. What can I do in the bathroom?  Use night lights.  Install grab bars by the toilet and in the tub and shower. Do not use towel bars as grab bars.  Use non-skid mats or decals in the tub or shower.  If you need to sit down in the shower, use a plastic, non-slip stool.  Keep the floor dry. Clean up any water that spills on the floor as soon as it happens.  Remove soap buildup in the tub or shower regularly.  Attach bath mats securely with double-sided non-slip rug tape.  Do not have throw rugs and other things on  the floor that can make you trip. What can I do in the bedroom?  Use night lights.  Make sure that you have a light by your bed that is easy to reach.  Do not use any sheets or blankets that are too big for your bed. They should not hang down onto the floor.  Have a firm chair that has side arms. You can use this for support while you get dressed.  Do not have throw rugs and other things on the floor that can make you trip. What can I do in the kitchen?  Clean up any spills right away.  Avoid walking on wet floors.  Keep items that you use a lot in easy-to-reach places.  If you need to reach something above you, use a strong step stool that has a grab bar.  Keep electrical cords out of the way.  Do not use floor polish or wax that makes floors slippery. If you must use wax, use non-skid floor wax.  Do not have throw rugs and other things on the floor that can make you trip. What can I do with my stairs?  Do not leave any items on the stairs.  Make sure that  there are handrails on both sides of the stairs and use them. Fix handrails that are broken or loose. Make sure that handrails are as long as the stairways.  Check any carpeting to make sure that it is firmly attached to the stairs. Fix any carpet that is loose or worn.  Avoid having throw rugs at the top or bottom of the stairs. If you do have throw rugs, attach them to the floor with carpet tape.  Make sure that you have a light switch at the top of the stairs and the bottom of the stairs. If you do not have them, ask someone to add them for you. What else can I do to help prevent falls?  Wear shoes that:  Do not have high heels.  Have rubber bottoms.  Are comfortable and fit you well.  Are closed at the toe. Do not wear sandals.  If you use a stepladder:  Make sure that it is fully opened. Do not climb a closed stepladder.  Make sure that both sides of the stepladder are locked into place.  Ask someone to  hold it for you, if possible.  Clearly mark and make sure that you can see:  Any grab bars or handrails.  First and last steps.  Where the edge of each step is.  Use tools that help you move around (mobility aids) if they are needed. These include:  Canes.  Walkers.  Scooters.  Crutches.  Turn on the lights when you go into a dark area. Replace any light bulbs as soon as they burn out.  Set up your furniture so you have a clear path. Avoid moving your furniture around.  If any of your floors are uneven, fix them.  If there are any pets around you, be aware of where they are.  Review your medicines with your doctor. Some medicines can make you feel dizzy. This can increase your chance of falling. Ask your doctor what other things that you can do to help prevent falls. This information is not intended to replace advice given to you by your health care provider. Make sure you discuss any questions you have with your health care provider. Document Released: 04/03/2009 Document Revised: 11/13/2015 Document Reviewed: 07/12/2014 Elsevier Interactive Patient Education  2017 [employer].

## 2019-08-02 LAB — HEMOGLOBIN A1C
Est. average glucose Bld gHb Est-mCnc: 131 mg/dL
Hgb A1c MFr Bld: 6.2 % — ABNORMAL HIGH (ref 4.8–5.6)

## 2019-08-02 LAB — CMP14+EGFR
ALT: 30 IU/L (ref 0–44)
AST: 28 IU/L (ref 0–40)
Albumin/Globulin Ratio: 1.9 (ref 1.2–2.2)
Albumin: 4.5 g/dL (ref 3.8–4.8)
Alkaline Phosphatase: 54 IU/L (ref 39–117)
BUN/Creatinine Ratio: 8 — ABNORMAL LOW (ref 10–24)
BUN: 9 mg/dL (ref 8–27)
Bilirubin Total: 0.6 mg/dL (ref 0.0–1.2)
CO2: 19 mmol/L — ABNORMAL LOW (ref 20–29)
Calcium: 9.4 mg/dL (ref 8.6–10.2)
Chloride: 109 mmol/L — ABNORMAL HIGH (ref 96–106)
Creatinine, Ser: 1.13 mg/dL (ref 0.76–1.27)
GFR calc Af Amer: 78 mL/min/{1.73_m2} (ref >59)
GFR calc non Af Amer: 67 mL/min/{1.73_m2} (ref >59)
Globulin, Total: 2.4 g/dL (ref 1.5–4.5)
Glucose: 94 mg/dL (ref 65–99)
Potassium: 4.2 mmol/L (ref 3.5–5.2)
Sodium: 144 mmol/L (ref 134–144)
Total Protein: 6.9 g/dL (ref 6.0–8.5)

## 2019-08-02 LAB — LIPID PANEL
Chol/HDL Ratio: 3.6 ratio (ref 0.0–5.0)
Cholesterol, Total: 189 mg/dL (ref 100–199)
HDL: 52 mg/dL (ref >39)
LDL Chol Calc (NIH): 119 mg/dL — ABNORMAL HIGH (ref 0–99)
Triglycerides: 100 mg/dL (ref 0–149)
VLDL Cholesterol Cal: 18 mg/dL (ref 5–40)

## 2019-08-03 DIAGNOSIS — M24832 Other specific joint derangements of left wrist, not elsewhere classified: Secondary | ICD-10-CM | POA: Diagnosis not present

## 2019-08-03 DIAGNOSIS — M65842 Other synovitis and tenosynovitis, left hand: Secondary | ICD-10-CM | POA: Diagnosis not present

## 2019-08-20 ENCOUNTER — Encounter: Payer: Self-pay | Admitting: Nurse Practitioner

## 2019-09-14 DIAGNOSIS — M659 Synovitis and tenosynovitis, unspecified: Secondary | ICD-10-CM | POA: Diagnosis not present

## 2019-09-14 DIAGNOSIS — M25832 Other specified joint disorders, left wrist: Secondary | ICD-10-CM | POA: Diagnosis not present

## 2019-10-09 ENCOUNTER — Other Ambulatory Visit: Payer: Self-pay

## 2019-10-09 ENCOUNTER — Ambulatory Visit (INDEPENDENT_AMBULATORY_CARE_PROVIDER_SITE_OTHER): Payer: Medicare Other | Admitting: Sports Medicine

## 2019-10-09 ENCOUNTER — Encounter: Payer: Self-pay | Admitting: Sports Medicine

## 2019-10-09 VITALS — Temp 96.5°F

## 2019-10-09 DIAGNOSIS — M2141 Flat foot [pes planus] (acquired), right foot: Secondary | ICD-10-CM

## 2019-10-09 DIAGNOSIS — M79671 Pain in right foot: Secondary | ICD-10-CM

## 2019-10-09 DIAGNOSIS — M19079 Primary osteoarthritis, unspecified ankle and foot: Secondary | ICD-10-CM

## 2019-10-09 DIAGNOSIS — M2142 Flat foot [pes planus] (acquired), left foot: Secondary | ICD-10-CM

## 2019-10-09 DIAGNOSIS — M779 Enthesopathy, unspecified: Secondary | ICD-10-CM | POA: Diagnosis not present

## 2019-10-09 DIAGNOSIS — M2041 Other hammer toe(s) (acquired), right foot: Secondary | ICD-10-CM | POA: Diagnosis not present

## 2019-10-09 NOTE — Progress Notes (Signed)
Subjective: Curt Dupler is a 64 y.o. male patient who returns to office for follow-up evaluation of right foot pain.  Patient reports that he is doing very well has been using toe crest pad with improvement and no pain or symptoms currently.  Patient reports that the second toe color has improved since he has been wearing the pad and wearing his insoles where his fasciitis with no reoccurrence.  Patient denies any other pedal complaints or symptoms at this time.   Patient Active Problem List   Diagnosis Date Noted  . 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 Lump of skin 01/25/2019  . Tenosynovitis 12/27/2018  . Pain 08/21/2018  . Abnormal glucose 07/26/2018  . Left wrist pain 07/26/2018  . Hyperlipidemia 07/26/2018  . Nocturia 07/26/2018    Current Outpatient Medications on File Prior to Visit  Medication Sig Dispense Refill  . meloxicam (MOBIC) 7.5 MG tablet Take by mouth.    . Nutritional Supplements (VITAMIN D BOOSTER PO) Take 1 capsule by mouth daily.    . rosuvastatin (CRESTOR) 5 MG tablet TAKE 1 TABLET DAILY 90 tablet 3  . triamcinolone ointment (KENALOG) 0.5 % Apply 1 application topically 2 (two) times daily. 30 g 0  . vitamin E 200 UNIT capsule Take 200 Units by mouth daily.    . celecoxib (CELEBREX) 200 MG capsule Take 200 mg by mouth daily.     No current facility-administered medications on file prior to visit.    No Known Allergies  Objective:  General: Alert and oriented x3 in no acute distress  Dermatology: No open lesions bilateral lower extremities, no webspace macerations, no ecchymosis bilateral, all nails x 10 are well manicured.  Vascular: Dorsalis Pedis and Posterior Tibial pedal pulses palpable, Capillary Fill Time 3 seconds,(+) pedal hair growth bilateral, no edema bilateral lower extremities, Temperature gradient within normal limits.  Neurology: Johney Maine sensation intact via light touch bilateral.  Musculoskeletal: No tenderness to palpation  bilateral.  Unchanged decreased 1st MPJ rom Right>Left with functional limitus noted on weightbearing exam. + Pes planus. Hammertoe Right>left 2nd toes control with buttress pad. Strength within normal limits in all groups bilateral.   Assessment and Plan: Problem List Items Addressed This Visit    None    Visit Diagnoses    Right foot pain    -  Primary   Capsulitis       Arthritis of metatarsophalangeal (MTP) joint of great toe       Relevant Medications   celecoxib (CELEBREX) 200 MG capsule   Hammertoe of second toe of right foot       Pes planus of both feet           -Complete examination performed -Rediscussed continued care of mechanical foot changes especially at right foot -Advised patient continue with using the buttress pads and its helping the right -Advised patient that when it is time for new orthotics may benefit from additional metatarsal padding advised patient that he should follow-up with the [facility] regarding his orthotics and when it is time for new ones if he would like we can fax over today's note as well as a prescription detailing the fact that he should have increased padding to the metatarsal area to help with his foot pain and symptoms as well as helping to offload the first and second toe especially at the second toe where there is a hammertoe deformity -Advised continue with good supportive shoes daily for foot type -Return to office as needed or  sooner if problems or issues arise.  Landis Martins, DPM

## 2019-10-11 ENCOUNTER — Other Ambulatory Visit: Payer: Self-pay | Admitting: Nurse Practitioner

## 2019-10-12 DIAGNOSIS — M25832 Other specified joint disorders, left wrist: Secondary | ICD-10-CM | POA: Diagnosis not present

## 2019-10-12 DIAGNOSIS — M65832 Other synovitis and tenosynovitis, left forearm: Secondary | ICD-10-CM | POA: Diagnosis not present

## 2019-11-23 DIAGNOSIS — M25832 Other specified joint disorders, left wrist: Secondary | ICD-10-CM | POA: Diagnosis not present

## 2019-11-23 DIAGNOSIS — M659 Synovitis and tenosynovitis, unspecified: Secondary | ICD-10-CM | POA: Diagnosis not present

## 2019-11-26 ENCOUNTER — Encounter: Payer: Self-pay | Admitting: Nurse Practitioner

## 2019-12-10 ENCOUNTER — Other Ambulatory Visit: Payer: Self-pay

## 2019-12-10 ENCOUNTER — Encounter: Payer: Self-pay | Admitting: Nurse Practitioner

## 2019-12-10 ENCOUNTER — Ambulatory Visit (INDEPENDENT_AMBULATORY_CARE_PROVIDER_SITE_OTHER): Payer: Medicare Other | Admitting: Nurse Practitioner

## 2019-12-10 VITALS — BP 122/70 | HR 95 | Temp 97.8°F | Ht 67.0 in | Wt 176.8 lb

## 2019-12-10 DIAGNOSIS — R6882 Decreased libido: Secondary | ICD-10-CM | POA: Insufficient documentation

## 2019-12-10 DIAGNOSIS — R5383 Other fatigue: Secondary | ICD-10-CM

## 2019-12-10 NOTE — Progress Notes (Signed)
Established Patient Office Visit  Subjective:  Patient ID: Author Hatlestad, male    DOB: April 06, 1956  Age: 64 y.o. MRN: [id number]  CC:  Chief Complaint  Patient presents with  . Fatigue    patient would like his testosterone levels to be checked    Wt Readings from Last 3 Encounters: Here for fatigue.  He has been getting a good nights sleep as well.    12/10/19 : 176 lb 12.8 oz (80.2 kg) 08/01/19 : 184 lb (83.5 kg) 08/01/19 : 184 lb (83.5 kg)  He had been having lack of intimacy and intercourse.  His wife is now retired and she is more interested in intercourse but he is having libido.       Erectile Dysfunction This is a new problem. The current episode started more than 1 month ago. The nature of his difficulty is achieving erection and maintaining erection. Non-physiologic factors contributing to erectile dysfunction are a decreased libido. Irritative symptoms do not include frequency or urgency. Associated symptoms include dysuria. Nothing aggravates the symptoms. Past treatments include nothing.    HPI Robert Donaldson presents for complaints of fatigue. The fatigue started over the last year and he describes it as slowly onset of lost of interest in things. He is trying to get back into his routines and now plays golf and rides the bike three times per week. He reports a decrease in his sex drive and lost of interest. He also has some reports of impotents. He reports that he sleeps well and has not had any issues with anxiety or depression. He does reports some delay in initiating his urinary stream but denies nocturia and stopping his urinary flow.  Past Medical History:  Diagnosis Date  . HLD (hyperlipidemia)   . Vitamin D deficiency     Past Surgical History:  Procedure Laterality Date  . KNEE SURGERY      Family History  Problem Relation Age of Onset  . Hyperlipidemia Sister     Social History   Socioeconomic History  . Marital status: Married    Spouse  name: Not on file  . Number of children: Not on file  . Years of education: Not on file  . Highest education level: Not on file  Occupational History  . Occupation: retired  Tobacco Use  . Smoking status: Former Smoker    Types: Cigarettes  . Smokeless tobacco: Never Used  Vaping Use  . Vaping Use: Never used  Substance and Sexual Activity  . Alcohol use: Not Currently    Comment: occassionally  . Drug use: No  . Sexual activity: Yes  Other Topics Concern  . Not on file  Social History Narrative  . Not on file   Social Determinants of Health   Financial Resource Strain: Low Risk   . Difficulty of Paying Living Expenses: Not hard at all  Food Insecurity: No Food Insecurity  . Worried About Charity fundraiser in the Last Year: Never true  . Ran Out of Food in the Last Year: Never true  Transportation Needs: No Transportation Needs  . Lack of Transportation (Medical): No  . Lack of Transportation (Non-Medical): No  Physical Activity: Sufficiently Active  . Days of Exercise per Week: 6 days  . Minutes of Exercise per Session: 120 min  Stress: No Stress Concern Present  . Feeling of Stress : Not at all  Social Connections:   . Frequency of Communication with Friends and Family:   . Frequency of  Social Gatherings with Friends and Family:   . Attends Religious Services:   . Active Member of Clubs or Organizations:   . Attends Archivist Meetings:   Marland Kitchen Marital Status:   Intimate Partner Violence:   . Fear of Current or Ex-Partner:   . Emotionally Abused:   Marland Kitchen Physically Abused:   . Sexually Abused:     Outpatient Medications Prior to Visit  Medication Sig Dispense Refill  . celecoxib (CELEBREX) 200 MG capsule Take 200 mg by mouth daily.    . meloxicam (MOBIC) 7.5 MG tablet Take by mouth.    . Nutritional Supplements (VITAMIN D BOOSTER PO) Take 1 capsule by mouth daily.    . rosuvastatin (CRESTOR) 5 MG tablet TAKE 1 TABLET DAILY 90 tablet 3  . triamcinolone  ointment (KENALOG) 0.5 % Apply 1 application topically 2 (two) times daily. (Patient taking differently: Apply 1 application topically as needed. ) 30 g 0  . vitamin E 200 UNIT capsule Take 5,000 Units by mouth daily.      No facility-administered medications prior to visit.    No Known Allergies  ROS Review of Systems  Constitutional: Positive for fatigue.  HENT: Negative.   Eyes: Negative.   Respiratory: Negative.   Cardiovascular: Negative.   Gastrointestinal: Negative.   Endocrine: Negative for polydipsia, polyphagia and polyuria.  Genitourinary: Positive for decreased libido and dysuria. Negative for decreased urine volume, discharge, frequency, genital sores, penile pain, penile swelling, scrotal swelling, testicular pain and urgency.       Reports some decreased urinary initiation.  Musculoskeletal: Negative.   Skin: Negative.   Allergic/Immunologic: Negative.   Neurological: Negative.   Hematological: Negative.   Psychiatric/Behavioral: Negative.       Objective:    Physical Exam Constitutional:      General: He is not in acute distress.    Appearance: Normal appearance. He is normal weight.  HENT:     Head: Normocephalic and atraumatic.  Cardiovascular:     Rate and Rhythm: Normal rate and regular rhythm.     Pulses: Normal pulses.     Heart sounds: Normal heart sounds. No murmur heard.  No friction rub.  Pulmonary:     Effort: Pulmonary effort is normal. No respiratory distress.     Breath sounds: Normal breath sounds.  Musculoskeletal:        General: Normal range of motion.  Skin:    General: Skin is warm and dry.     Findings: No lesion.  Neurological:     General: No focal deficit present.     Mental Status: He is alert and oriented to person, place, and time.     Cranial Nerves: No cranial nerve deficit.  Psychiatric:        Mood and Affect: Mood normal.        Behavior: Behavior normal.        Thought Content: Thought content normal.         Judgment: Judgment normal.     Comments: Does report a loss in interest in working out and sexual drive.     BP 122/70 (BP Location: Left Arm, Patient Position: Sitting, Cuff Size: Small)   Pulse 95   Temp 97.8 F (36.6 C) (Oral)   Ht 5\' 7"  (1.702 m)   Wt 176 lb 12.8 oz (80.2 kg)   BMI 27.69 kg/m  Wt Readings from Last 3 Encounters:  12/10/19 176 lb 12.8 oz (80.2 kg)  08/01/19 184 lb (83.5 kg)  08/01/19 184 lb (83.5 kg)     Health Maintenance Due  Topic Date Due  . PNA vac Low Risk Adult (2 of 2 - PCV13) 07/27/2019    There are no preventive care reminders to display for this patient.  No results found for: TSH  Lab Results  Component Value Date   WBC 6.5 07/26/2018   HGB 14.9 07/26/2018   HCT 44.9 07/26/2018   MCV 89 07/26/2018   PLT 261 07/26/2018   Lab Results  Component Value Date   NA 144 08/01/2019   K 4.2 08/01/2019   CO2 19 (L) 08/01/2019   GLUCOSE 94 08/01/2019   BUN 9 08/01/2019   CREATININE 1.13 08/01/2019   BILITOT 0.6 08/01/2019   ALKPHOS 54 08/01/2019   AST 28 08/01/2019   ALT 30 08/01/2019   PROT 6.9 08/01/2019   ALBUMIN 4.5 08/01/2019   CALCIUM 9.4 08/01/2019   Lab Results  Component Value Date   CHOL 189 08/01/2019   Lab Results  Component Value Date   HDL 52 08/01/2019   Lab Results  Component Value Date   LDLCALC 119 (H) 08/01/2019   Lab Results  Component Value Date   TRIG 100 08/01/2019   Lab Results  Component Value Date   CHOLHDL 3.6 08/01/2019   Lab Results  Component Value Date   HGBA1C 6.2 (H) 08/01/2019      Assessment & Plan:  1. Fatigue, unspecified type  Will check metabolic causes  May be related to deconditioning encouraged to start back incorporating cardio activity.  - TSH - CBC  2. Decreased libido  Pending labs will start testosterone - Testosterone, Total    Marylu Lund, RN

## 2019-12-11 LAB — CBC
Hematocrit: 45.2 % (ref 37.5–51.0)
Hemoglobin: 15.3 g/dL (ref 13.0–17.7)
MCH: 31.3 pg (ref 26.6–33.0)
MCHC: 33.8 g/dL (ref 31.5–35.7)
MCV: 92 fL (ref 79–97)
Platelets: 225 10*3/uL (ref 150–450)
RBC: 4.89 x10E6/uL (ref 4.14–5.80)
RDW: 12.7 % (ref 11.6–15.4)
WBC: 8.1 10*3/uL (ref 3.4–10.8)

## 2019-12-11 LAB — VITAMIN B12: Vitamin B-12: 614 pg/mL (ref 232–1245)

## 2019-12-11 LAB — VITAMIN D 25 HYDROXY (VIT D DEFICIENCY, FRACTURES): Vit D, 25-Hydroxy: 49.6 ng/mL (ref 30.0–100.0)

## 2019-12-11 LAB — TSH: TSH: 1.71 u[IU]/mL (ref 0.450–4.500)

## 2019-12-11 LAB — TESTOSTERONE: Testosterone: 465 ng/dL (ref 264–916)

## 2020-01-09 IMAGING — US US SOFT TISSUE EXCLUDE HEAD/NECK
1 series · 14 of 14 positions shown · non-contrast
Comparison: None.

CLINICAL DATA: Lump in the sternal area

EXAM:
ULTRASOUND OF HEAD/NECK SOFT TISSUES
TECHNIQUE: Ultrasound examination of the head and neck soft tissues was
performed in the area of clinical concern.

[Series 1: us soft tissue exclude head/neck · 0.05mm/px · 14 acquisitions, 14 frames shown]
[im 1/14]
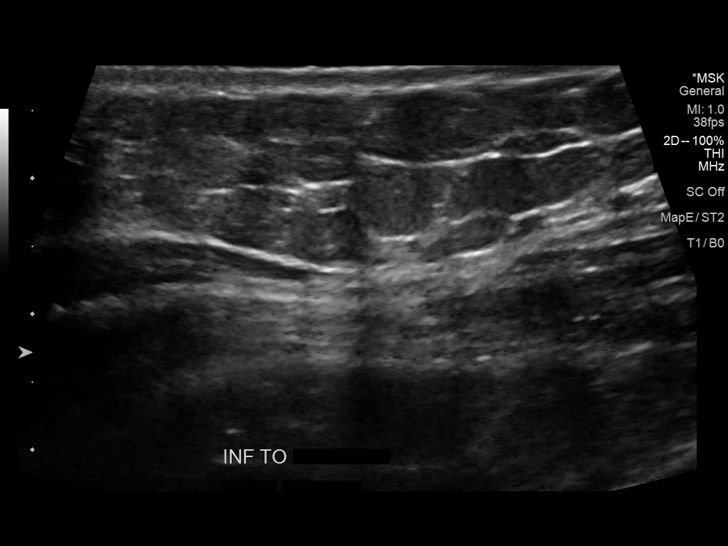
[im 2/14]
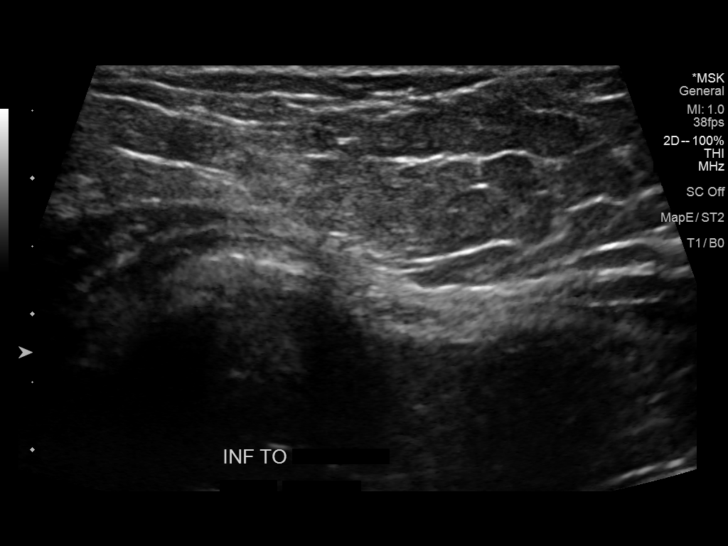
[im 3/14]
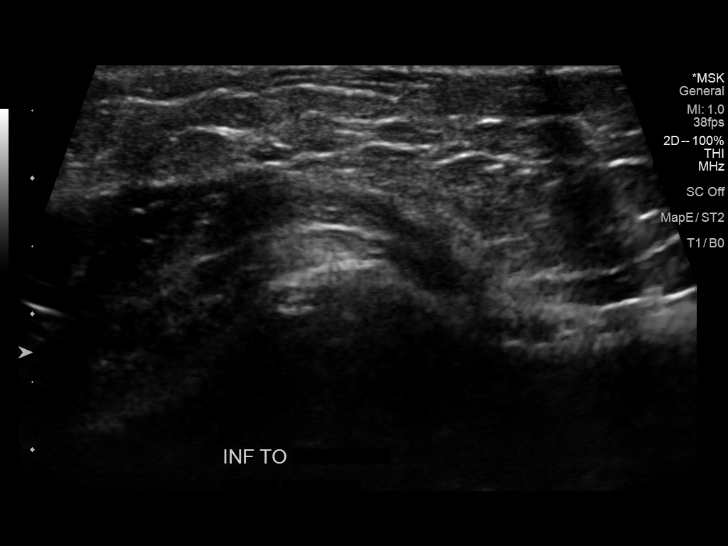
[im 4/14]
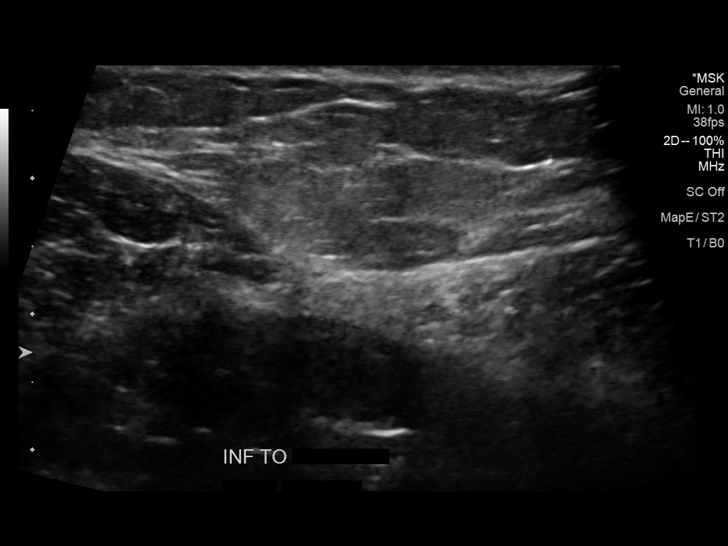
[im 5/14]
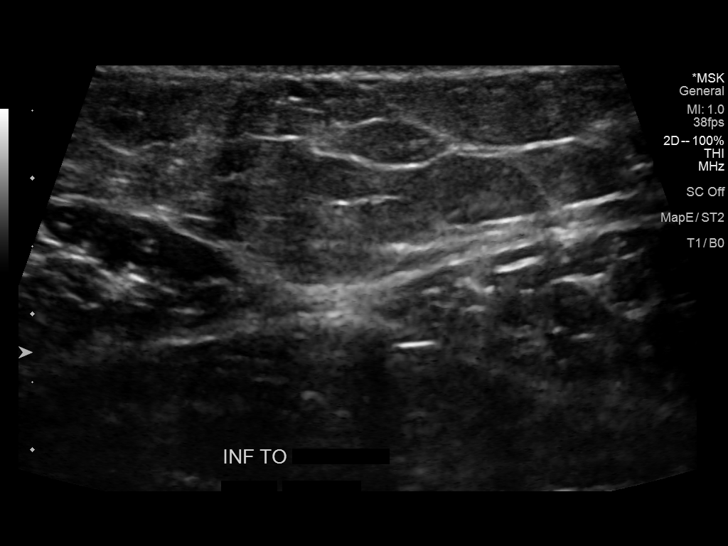
[im 6/14]
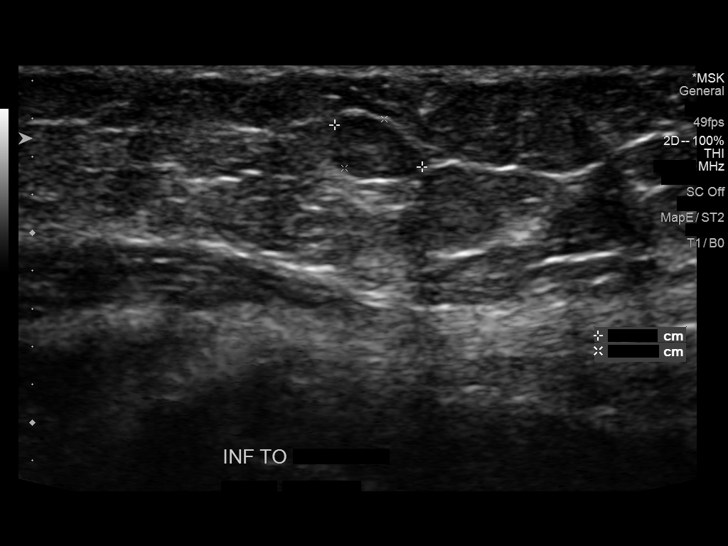
[im 7/14]
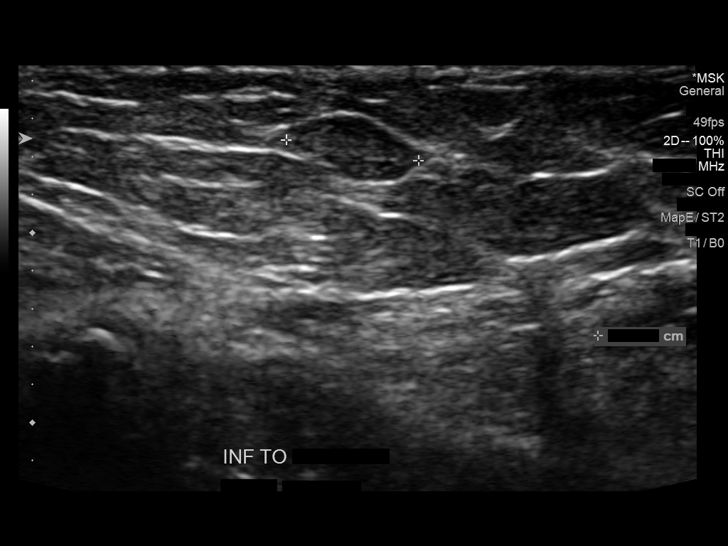
[im 8/14]
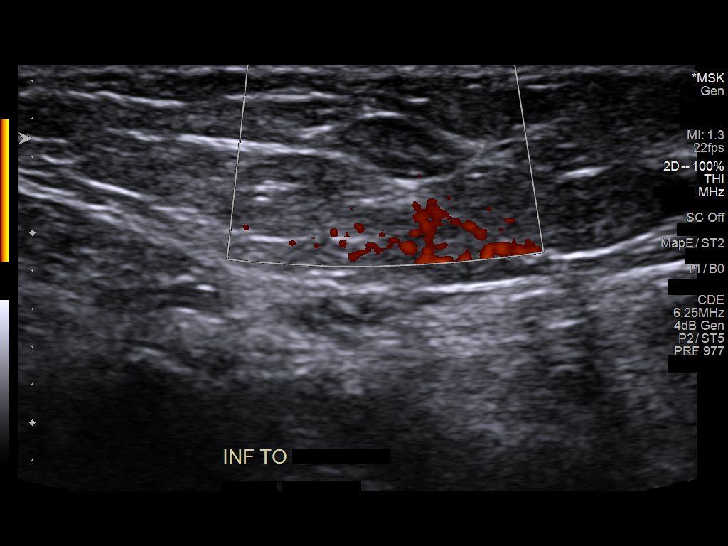
[im 9/14]
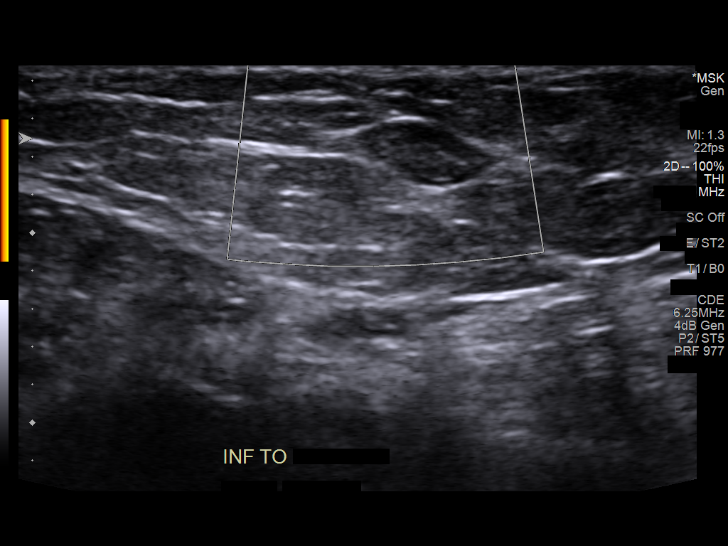
[im 10/14]
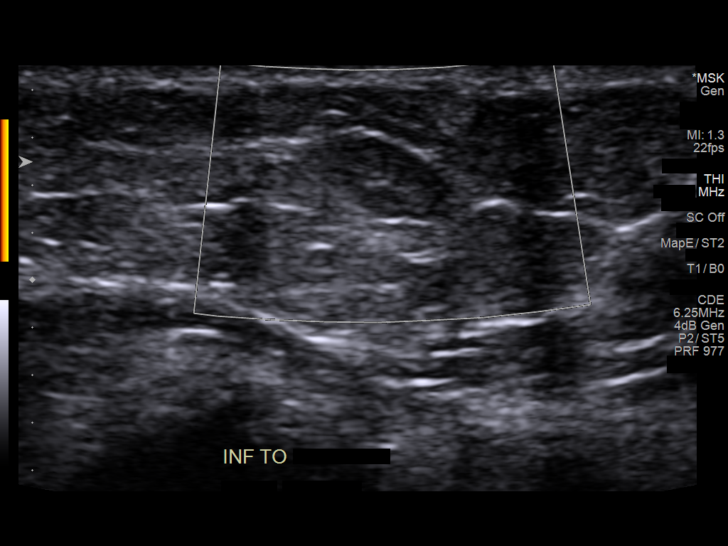
[im 11/14]
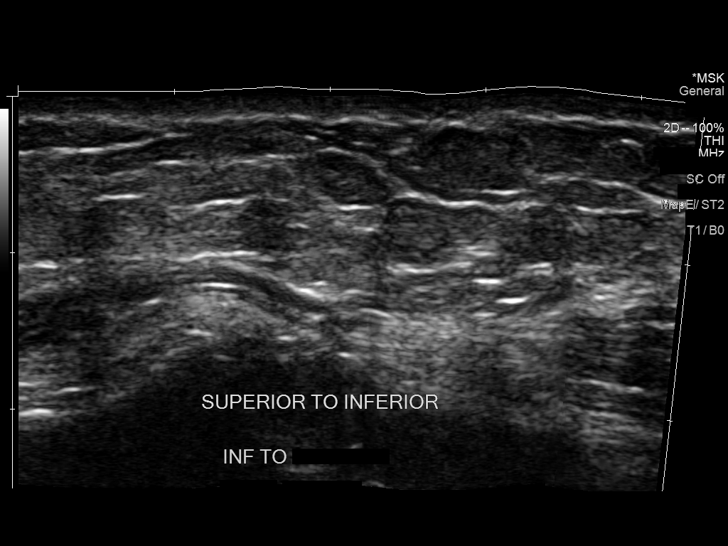
[im 12/14]
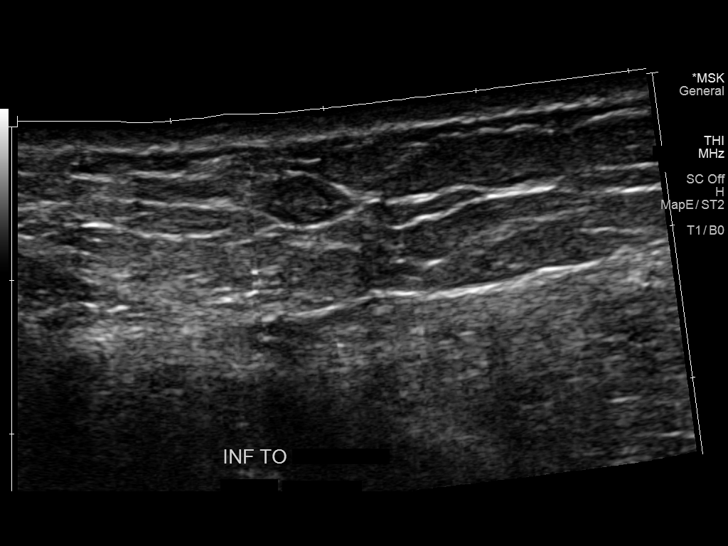
[im 13/14]
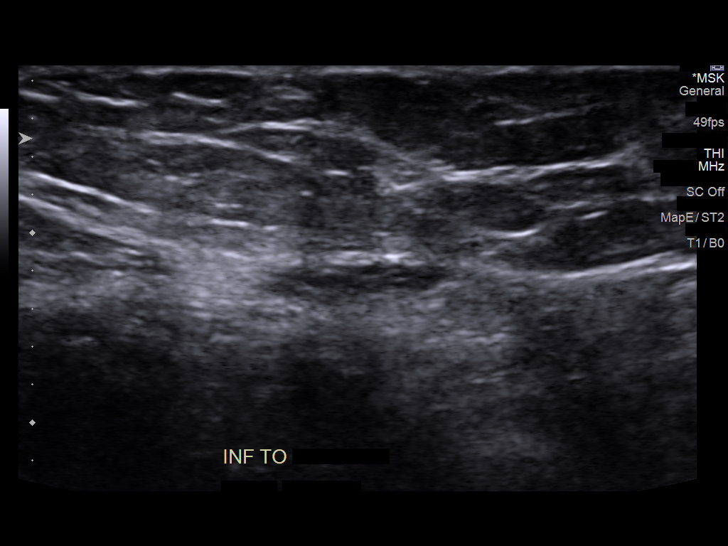
[im 14/14]
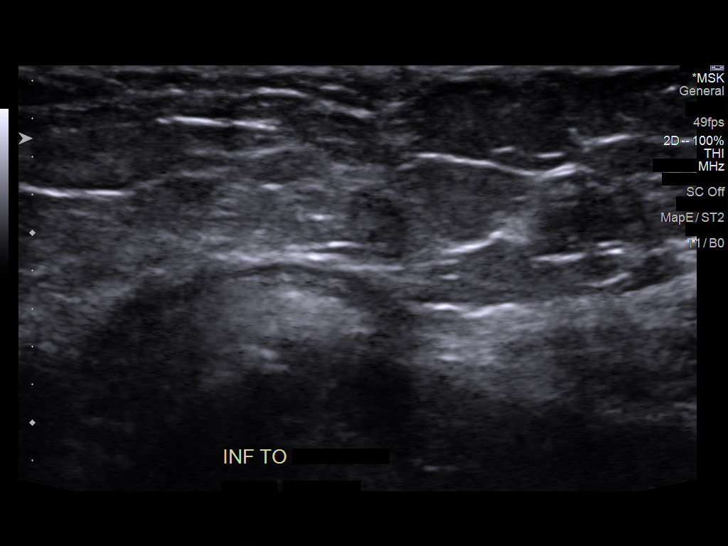

[14 of 14 positions shown; findings below may reference images not displayed]

FINDINGS: Inferior to the sternum in the patient's midline there is a 0.5 x
0.3 x 0.7 cm structure that appears to be isoechoic to the
surrounding fat. There is no significant internal color Doppler
flow.
IMPRESSION: There is a 0.7 cm mass inferior to the sternum the corresponds to
the patient's palpable area of concern. This is favored to represent
a small lipoma. If there is interval growth, follow-up is
recommended.

## 2020-01-29 ENCOUNTER — Encounter: Payer: Self-pay | Admitting: Nurse Practitioner

## 2020-01-29 ENCOUNTER — Ambulatory Visit (INDEPENDENT_AMBULATORY_CARE_PROVIDER_SITE_OTHER): Payer: Medicare Other | Admitting: Nurse Practitioner

## 2020-01-29 ENCOUNTER — Other Ambulatory Visit: Payer: Self-pay

## 2020-01-29 VITALS — BP 136/84 | HR 83 | Temp 97.5°F | Ht 67.0 in | Wt 178.6 lb

## 2020-01-29 DIAGNOSIS — R7309 Other abnormal glucose: Secondary | ICD-10-CM | POA: Diagnosis not present

## 2020-01-29 DIAGNOSIS — E785 Hyperlipidemia, unspecified: Secondary | ICD-10-CM | POA: Diagnosis not present

## 2020-01-29 DIAGNOSIS — H524 Presbyopia: Secondary | ICD-10-CM | POA: Diagnosis not present

## 2020-01-29 DIAGNOSIS — H353112 Nonexudative age-related macular degeneration, right eye, intermediate dry stage: Secondary | ICD-10-CM | POA: Diagnosis not present

## 2020-01-29 DIAGNOSIS — H35041 Retinal micro-aneurysms, unspecified, right eye: Secondary | ICD-10-CM | POA: Diagnosis not present

## 2020-01-29 DIAGNOSIS — H5203 Hypermetropia, bilateral: Secondary | ICD-10-CM | POA: Diagnosis not present

## 2020-01-29 DIAGNOSIS — H40023 Open angle with borderline findings, high risk, bilateral: Secondary | ICD-10-CM | POA: Diagnosis not present

## 2020-01-29 DIAGNOSIS — H52223 Regular astigmatism, bilateral: Secondary | ICD-10-CM | POA: Diagnosis not present

## 2020-01-29 DIAGNOSIS — H2511 Age-related nuclear cataract, right eye: Secondary | ICD-10-CM | POA: Diagnosis not present

## 2020-01-29 NOTE — Progress Notes (Signed)
Rutherford Nail as a scribe for Minette Brine, FNP.,have documented all relevant documentation on the behalf of Minette Brine, FNP,as directed by  Minette Brine, FNP while in the presence of Minette Brine, [address].  This visit occurred during the SARS-CoV-2 public health emergency.  Safety protocols were in place, including screening questions prior to the visit, additional usage of staff PPE, and extensive cleaning of exam room while observing appropriate contact time as indicated for disinfecting solutions.  Subjective:     Patient ID: Robert Donaldson , male    DOB: 05-Jan-1956 , 64 y.o.   MRN: [id number]   Chief Complaint  Patient presents with  . Hyperlipidemia  . Prediabetes    HPI  Hyperlipidemia - taking rosuvastatin daily. He has been eating more pork (last week) and increased amount of cake for his wife's birthday.     Hyperlipidemia This is a chronic problem. The current episode started more than 1 year ago. The problem is controlled. Recent lipid tests were reviewed and are high. He has no history of chronic renal disease. There are no known factors aggravating his hyperlipidemia. Pertinent negatives include no chest pain. Current antihyperlipidemic treatment includes exercise and statins. The current treatment provides significant improvement of lipids. There are no compliance problems.  Risk factors for coronary artery disease include male sex and dyslipidemia.     Past Medical History:  Diagnosis Date  . HLD (hyperlipidemia)   . Vitamin D deficiency      Family History  Problem Relation Age of Onset  . Hyperlipidemia Sister      Current Outpatient Medications:  .  celecoxib (CELEBREX) 200 MG capsule, Take 200 mg by mouth daily., Disp: , Rfl:  .  Nutritional Supplements (VITAMIN D BOOSTER PO), Take 1 capsule by mouth daily., Disp: , Rfl:  .  rosuvastatin (CRESTOR) 5 MG tablet, TAKE 1 TABLET DAILY, Disp: 90 tablet, Rfl: 3 .  triamcinolone ointment (KENALOG) 0.5 %,  Apply 1 application topically 2 (two) times daily. (Patient taking differently: Apply 1 application topically as needed. ), Disp: 30 g, Rfl: 0 .  vitamin E 200 UNIT capsule, Take 5,000 Units by mouth daily. , Disp: , Rfl:  .  meloxicam (MOBIC) 7.5 MG tablet, Take by mouth. (Patient not taking: Reported on 01/29/2020), Disp: , Rfl:    No Known Allergies   Review of Systems  Constitutional: Negative.  Negative for fatigue.  HENT: Negative.   Respiratory: Negative.   Cardiovascular: Negative.  Negative for chest pain and leg swelling.  Endocrine: Negative for polydipsia, polyphagia and polyuria.  Musculoskeletal: Negative.   Skin: Negative.   Neurological: Negative for dizziness and headaches.  Psychiatric/Behavioral: Negative.      Today's Vitals   01/29/20 1121  BP: 136/84  Pulse: 83  Temp: (!) 97.5 F (36.4 C)  TempSrc: Oral  Weight: 178 lb 9.6 oz (81 kg)  Height: 5' 7"  (1.702 m)  PainSc: 1   PainLoc: Arm   Body mass index is 27.97 kg/m.   Objective:  Physical Exam Vitals reviewed.  Constitutional:      General: He is not in acute distress.    Appearance: Normal appearance. He is obese.  Cardiovascular:     Rate and Rhythm: Normal rate and regular rhythm.     Pulses: Normal pulses.     Heart sounds: Normal heart sounds. No murmur heard.   Pulmonary:     Effort: Pulmonary effort is normal. No respiratory distress.     Breath sounds: Normal breath  sounds.  Musculoskeletal:        General: No swelling.  Skin:    General: Skin is warm and dry.     Capillary Refill: Capillary refill takes less than 2 seconds.  Neurological:     General: No focal deficit present.     Mental Status: He is alert and oriented to person, place, and time.  Psychiatric:        Mood and Affect: Mood normal.        Behavior: Behavior normal.        Thought Content: Thought content normal.        Judgment: Judgment normal.       Assessment And Plan:     1. Hyperlipidemia, unspecified  hyperlipidemia type  Chronic, stable  Continue with current medications - Lipid panel - CMP14+EGFR  2. Abnormal glucose  Chronic, well controlled   No current medications - Hemoglobin A1c     Patient was given opportunity to ask questions. Patient verbalized understanding of the plan and was able to repeat key elements of the plan. All questions were answered to their satisfaction.  Minette Brine, FNP   I, Minette Brine, FNP, have reviewed all documentation for this visit. The documentation on 01/30/20 for the exam, diagnosis, procedures, and orders are all accurate and complete.  THE PATIENT IS ENCOURAGED TO PRACTICE SOCIAL DISTANCING DUE TO THE COVID-19 PANDEMIC.

## 2020-01-30 LAB — CMP14+EGFR
ALT: 31 IU/L (ref 0–44)
AST: 34 IU/L (ref 0–40)
Albumin/Globulin Ratio: 1.6 (ref 1.2–2.2)
Albumin: 4.5 g/dL (ref 3.8–4.8)
Alkaline Phosphatase: 59 IU/L (ref 48–121)
BUN/Creatinine Ratio: 14 (ref 10–24)
BUN: 18 mg/dL (ref 8–27)
Bilirubin Total: 0.8 mg/dL (ref 0.0–1.2)
CO2: 20 mmol/L (ref 20–29)
Calcium: 9.5 mg/dL (ref 8.6–10.2)
Chloride: 103 mmol/L (ref 96–106)
Creatinine, Ser: 1.28 mg/dL — ABNORMAL HIGH (ref 0.76–1.27)
GFR calc Af Amer: 67 mL/min/{1.73_m2} (ref >59)
GFR calc non Af Amer: 58 mL/min/{1.73_m2} — ABNORMAL LOW (ref >59)
Globulin, Total: 2.8 g/dL (ref 1.5–4.5)
Glucose: 102 mg/dL — ABNORMAL HIGH (ref 65–99)
Potassium: 4.2 mmol/L (ref 3.5–5.2)
Sodium: 138 mmol/L (ref 134–144)
Total Protein: 7.3 g/dL (ref 6.0–8.5)

## 2020-01-30 LAB — HEMOGLOBIN A1C
Est. average glucose Bld gHb Est-mCnc: 128 mg/dL
Hgb A1c MFr Bld: 6.1 % — ABNORMAL HIGH (ref 4.8–5.6)

## 2020-01-30 LAB — LIPID PANEL
Chol/HDL Ratio: 3.7 ratio (ref 0.0–5.0)
Cholesterol, Total: 245 mg/dL — ABNORMAL HIGH (ref 100–199)
HDL: 67 mg/dL (ref >39)
LDL Chol Calc (NIH): 162 mg/dL — ABNORMAL HIGH (ref 0–99)
Triglycerides: 94 mg/dL (ref 0–149)
VLDL Cholesterol Cal: 16 mg/dL (ref 5–40)

## 2020-01-31 ENCOUNTER — Encounter: Payer: Self-pay | Admitting: Nurse Practitioner

## 2020-02-27 DIAGNOSIS — M25832 Other specified joint disorders, left wrist: Secondary | ICD-10-CM | POA: Diagnosis not present

## 2020-03-26 ENCOUNTER — Telehealth: Payer: Self-pay

## 2020-03-26 ENCOUNTER — Encounter: Payer: Self-pay | Admitting: Nurse Practitioner

## 2020-03-26 DIAGNOSIS — M25571 Pain in right ankle and joints of right foot: Secondary | ICD-10-CM | POA: Diagnosis not present

## 2020-03-26 DIAGNOSIS — S93491S Sprain of other ligament of right ankle, sequela: Secondary | ICD-10-CM | POA: Diagnosis not present

## 2020-03-26 NOTE — Telephone Encounter (Signed)
Patient called requesting an appointment for knee pain. I returned his call and left him a voicemail. He then called me back stating his pain was so bad he went to [facility] and he stated they wrapped his ankle and gave him some naproxen. He stated he thinks he has some torn ligaments and he wasn't sure If there was anything else you wanted to do. YL,RMA

## 2020-03-26 NOTE — Telephone Encounter (Signed)
Patient called requesting an appointment for ankle pain. I returned his call and left him a voicemail he then called back and left a voicmail saying he went to

## 2020-03-28 ENCOUNTER — Encounter: Payer: Self-pay | Admitting: Nurse Practitioner

## 2020-03-28 ENCOUNTER — Encounter: Payer: Self-pay | Admitting: Sports Medicine

## 2020-04-04 NOTE — Telephone Encounter (Signed)
Called pt and updated him

## 2020-04-21 DIAGNOSIS — Z23 Encounter for immunization: Secondary | ICD-10-CM | POA: Diagnosis not present

## 2020-06-02 DIAGNOSIS — M25832 Other specified joint disorders, left wrist: Secondary | ICD-10-CM | POA: Diagnosis not present

## 2020-06-21 ENCOUNTER — Encounter: Payer: Self-pay | Admitting: Nurse Practitioner

## 2020-07-16 NOTE — Telephone Encounter (Signed)
He needs to schedule an appt 

## 2020-07-21 ENCOUNTER — Ambulatory Visit (INDEPENDENT_AMBULATORY_CARE_PROVIDER_SITE_OTHER): Payer: Medicare Other | Admitting: Nurse Practitioner

## 2020-07-21 ENCOUNTER — Other Ambulatory Visit: Payer: Self-pay

## 2020-07-21 ENCOUNTER — Encounter: Payer: Self-pay | Admitting: Nurse Practitioner

## 2020-07-21 VITALS — BP 112/70 | HR 64 | Temp 97.7°F | Ht 67.0 in | Wt 180.6 lb

## 2020-07-21 DIAGNOSIS — R21 Rash and other nonspecific skin eruption: Secondary | ICD-10-CM | POA: Diagnosis not present

## 2020-07-21 MED ORDER — HYDROCORTISONE 1 % EX CREA: TOPICAL_CREAM | CUTANEOUS | 1 refills | Status: DC

## 2020-07-21 MED ORDER — VALACYCLOVIR HCL 1 G PO TABS: 1000.0000 mg | ORAL_TABLET | Freq: Three times a day (TID) | ORAL | 0 refills | Status: AC

## 2020-07-21 NOTE — Progress Notes (Signed)
I,Yamilka Roman [employer] as a Education administrator for [employer], FNP.,have documented all relevant documentation on the behalf of Minette Brine, FNP,as directed by  Minette Brine, FNP while in the presence of Minette Brine, [place]. This visit occurred during the SARS-CoV-2 public health emergency.  Safety protocols were in place, including screening questions prior to the visit, additional usage of staff PPE, and extensive cleaning of exam room while observing appropriate contact time as indicated for disinfecting solutions.  Subjective:     Patient ID: Robert Donaldson , male    DOB: 09-Sep-1955 , 65 y.o.   MRN: [id number]   Chief Complaint  Patient presents with  . Rash    Patient has a rash on his stomach that has been really itchy and he denies having any pain. He stated the rash appeared after receiving his shingles shot.     HPI  Patient presents today for a rash on his stomach.  He had his shingles vaccine in September.  He has had both of his shingles vaccine. The current rash on his left side started a couple weeks ago, he has been using Eucerin cream.  He reports itching. He denies having fluid filled vesicles.      Past Medical History:  Diagnosis Date  . HLD (hyperlipidemia)   . Vitamin D deficiency      Family History  Problem Relation Age of Onset  . Hyperlipidemia Sister      Current Outpatient Medications:  .  hydrocortisone cream 1 %, Apply to affected area 2 times daily, Disp: 30 g, Rfl: 1 .  Nutritional Supplements (VITAMIN D BOOSTER PO), Take 1 capsule by mouth daily., Disp: , Rfl:  .  rosuvastatin (CRESTOR) 5 MG tablet, TAKE 1 TABLET DAILY, Disp: 90 tablet, Rfl: 3 .  valACYclovir (VALTREX) 1000 MG tablet, Take 1 tablet (1,000 mg total) by mouth 3 (three) times daily for 5 days., Disp: 15 tablet, Rfl: 0 .  vitamin E 200 UNIT capsule, Take 5,000 Units by mouth daily. , Disp: , Rfl:  .  celecoxib (CELEBREX) 200 MG capsule, Take 200 mg by mouth daily. (Patient not taking:  Reported on 07/21/2020), Disp: , Rfl:  .  meloxicam (MOBIC) 7.5 MG tablet, Take by mouth. (Patient not taking: No sig reported), Disp: , Rfl:    No Known Allergies   Review of Systems  Constitutional: Negative.   HENT: Negative.   Eyes: Negative.   Respiratory: Negative.   Cardiovascular: Negative.   Gastrointestinal: Negative.   Endocrine: Negative.   Genitourinary: Negative.   Musculoskeletal: Negative.   Skin: Positive for rash (left side of abdomen).  Neurological: Negative.   Hematological: Negative.   Psychiatric/Behavioral: Negative.      Today's Vitals   07/21/20 1026  BP: 112/70  Pulse: 64  Temp: 97.7 F (36.5 C)  TempSrc: Oral  Weight: 180 lb 9.6 oz (81.9 kg)  Height: 5\' 7"  (1.702 m)  PainSc: 0-No pain   Body mass index is 28.29 kg/m.   Objective:  Physical Exam Constitutional:      General: He is not in acute distress.    Appearance: Normal appearance. He is normal weight.  Pulmonary:     Effort: Pulmonary effort is normal. No respiratory distress.  Skin:    Capillary Refill: Capillary refill takes less than 2 seconds.     Findings: Rash (left lateral abdomen with hypertrophic areas ) present.  Neurological:     General: No focal deficit present.     Mental Status: He is  alert and oriented to person, place, and time.     Cranial Nerves: No cranial nerve deficit.  Psychiatric:        Mood and Affect: Mood normal.        Behavior: Behavior normal.        Thought Content: Thought content normal.        Judgment: Judgment normal.         Assessment And Plan:     1. Rash and nonspecific skin eruption  Has hypertrophic rash to left side of abdomen, likely shingles  Will treat with valtrex and refer to dermatology at patient request  He has received his shingrix vaccine - valACYclovir (VALTREX) 1000 MG tablet; Take 1 tablet (1,000 mg total) by mouth 3 (three) times daily for 5 days.  Dispense: 15 tablet; Refill: 0 - hydrocortisone cream 1 %;  Apply to affected area 2 times daily  Dispense: 30 g; Refill: 1 - Ambulatory referral to Dermatology     Patient was given opportunity to ask questions. Patient verbalized understanding of the plan and was able to repeat key elements of the plan. All questions were answered to their satisfaction.  Minette Brine, FNP   I, Minette Brine, FNP, have reviewed all documentation for this visit. The documentation on 07/21/20 for the exam, diagnosis, procedures, and orders are all accurate and complete.   THE PATIENT IS ENCOURAGED TO PRACTICE SOCIAL DISTANCING DUE TO THE COVID-19 PANDEMIC.

## 2020-07-21 NOTE — Patient Instructions (Signed)
Shingles  Shingles is an infection. It gives you a painful skin rash and blisters that have fluid in them. Shingles is caused by the same germ (virus) that causes chickenpox. Shingles only happens in people who:  Have had chickenpox.  Have been given a shot of medicine (vaccine) to protect against chickenpox. Shingles is rare in this group. The first symptoms of shingles may be itching, tingling, or pain in an area on your skin. A rash will show on your skin a few days or weeks later. The rash is likely to be on one side of your body. The rash usually has a shape like a belt or a band. Over time, the rash turns into fluid-filled blisters. The blisters will break open, change into scabs, and dry up. Medicines may:  Help with pain and itching.  Help you get better sooner.  Help to prevent long-term problems. Follow these instructions at home: Medicines  Take over-the-counter and prescription medicines only as told by your doctor.  Put on an anti-itch cream or numbing cream where you have a rash, blisters, or scabs. Do this as told by your doctor. Helping with itching and discomfort  Put cold, wet cloths (cold compresses) on the area of the rash or blisters as told by your doctor.  Cool baths can help you feel better. Try adding baking soda or dry oatmeal to the water to lessen itching. Do not bathe in hot water.   Blister and rash care  Keep your rash covered with a loose bandage (dressing).  Wear loose clothing that does not rub on your rash.  Keep your rash and blisters clean. To do this, wash the area with mild soap and cool water as told by your doctor.  Check your rash every day for signs of infection. Check for: ? More redness, swelling, or pain. ? Fluid or blood. ? Warmth. ? Pus or a bad smell.  Do not scratch your rash. Do not pick at your blisters. To help you to not scratch: ? Keep your fingernails clean and cut short. ? Wear gloves or mittens when you sleep, if  scratching is a problem. General instructions  Rest as told by your doctor.  Keep all follow-up visits as told by your doctor. This is important.  Wash your hands often with soap and water. If soap and water are not available, use hand sanitizer. Doing this lowers your chance of getting a skin infection caused by germs (bacteria).  Your infection can cause chickenpox in people who have never had chickenpox or never got a shot of chickenpox vaccine. If you have blisters that did not change into scabs yet, try not to touch other people or be around other people, especially: ? Babies. ? Pregnant women. ? Children who have areas of red, itchy, or rough skin (eczema). ? Very old people who have transplants. ? People who have a long-term (chronic) sickness, like cancer or AIDS. Contact a doctor if:  Your pain does not get better with medicine.  Your pain does not get better after the rash heals.  You have any signs of infection in the rash area. These signs include: ? More redness, swelling, or pain around the rash. ? Fluid or blood coming from the rash. ? The rash area feeling warm to the touch. ? Pus or a bad smell coming from the rash. Get help right away if:  The rash is on your face or nose.  You have pain in your face or pain  by your eye.  You lose feeling on one side of your face.  You have trouble seeing.  You have ear pain, or you have ringing in your ear.  You have a loss of taste.  Your condition gets worse. Summary  Shingles gives you a painful skin rash and blisters that have fluid in them.  Shingles is an infection. It is caused by the same germ (virus) that causes chickenpox.  Keep your rash covered with a loose bandage (dressing). Wear loose clothing that does not rub on your rash.  If you have blisters that did not change into scabs yet, try not to touch other people or be around people. This information is not intended to replace advice given to you by  your health care provider. Make sure you discuss any questions you have with your health care provider. Document Revised: 09/29/2018 Document Reviewed: 02/09/2017 Elsevier Patient Education  2021 [place].  - you can get some Bio-oil at the cosmetic department at the pharmacy

## 2020-08-01 DIAGNOSIS — H353112 Nonexudative age-related macular degeneration, right eye, intermediate dry stage: Secondary | ICD-10-CM | POA: Diagnosis not present

## 2020-08-01 DIAGNOSIS — H2511 Age-related nuclear cataract, right eye: Secondary | ICD-10-CM | POA: Diagnosis not present

## 2020-08-01 DIAGNOSIS — H25812 Combined forms of age-related cataract, left eye: Secondary | ICD-10-CM | POA: Diagnosis not present

## 2020-08-01 DIAGNOSIS — H35041 Retinal micro-aneurysms, unspecified, right eye: Secondary | ICD-10-CM | POA: Diagnosis not present

## 2020-08-01 DIAGNOSIS — H40023 Open angle with borderline findings, high risk, bilateral: Secondary | ICD-10-CM | POA: Diagnosis not present

## 2020-08-06 ENCOUNTER — Ambulatory Visit (INDEPENDENT_AMBULATORY_CARE_PROVIDER_SITE_OTHER): Payer: Medicare Other

## 2020-08-06 ENCOUNTER — Other Ambulatory Visit: Payer: Self-pay

## 2020-08-06 ENCOUNTER — Ambulatory Visit (INDEPENDENT_AMBULATORY_CARE_PROVIDER_SITE_OTHER): Payer: Medicare Other | Admitting: Nurse Practitioner

## 2020-08-06 ENCOUNTER — Encounter: Payer: Self-pay | Admitting: Nurse Practitioner

## 2020-08-06 VITALS — BP 124/82 | HR 57 | Temp 97.8°F | Ht 68.4 in | Wt 186.2 lb

## 2020-08-06 VITALS — BP 124/82 | HR 57 | Temp 97.8°F | Ht 67.0 in | Wt 186.3 lb

## 2020-08-06 DIAGNOSIS — Z0000 Encounter for general adult medical examination without abnormal findings: Secondary | ICD-10-CM | POA: Diagnosis not present

## 2020-08-06 DIAGNOSIS — R7309 Other abnormal glucose: Secondary | ICD-10-CM | POA: Diagnosis not present

## 2020-08-06 DIAGNOSIS — R21 Rash and other nonspecific skin eruption: Secondary | ICD-10-CM

## 2020-08-06 DIAGNOSIS — E785 Hyperlipidemia, unspecified: Secondary | ICD-10-CM

## 2020-08-06 LAB — CMP14+EGFR
ALT: 32 IU/L (ref 0–44)
AST: 31 IU/L (ref 0–40)
Albumin/Globulin Ratio: 1.8 (ref 1.2–2.2)
Albumin: 4.6 g/dL (ref 3.8–4.8)
Alkaline Phosphatase: 59 IU/L (ref 44–121)
BUN/Creatinine Ratio: 12 (ref 10–24)
BUN: 14 mg/dL (ref 8–27)
Bilirubin Total: 0.5 mg/dL (ref 0.0–1.2)
CO2: 21 mmol/L (ref 20–29)
Calcium: 9.7 mg/dL (ref 8.6–10.2)
Chloride: 105 mmol/L (ref 96–106)
Creatinine, Ser: 1.19 mg/dL (ref 0.76–1.27)
GFR calc Af Amer: 73 mL/min/{1.73_m2} (ref >59)
GFR calc non Af Amer: 63 mL/min/{1.73_m2} (ref >59)
Globulin, Total: 2.5 g/dL (ref 1.5–4.5)
Glucose: 105 mg/dL — ABNORMAL HIGH (ref 65–99)
Potassium: 4.4 mmol/L (ref 3.5–5.2)
Sodium: 141 mmol/L (ref 134–144)
Total Protein: 7.1 g/dL (ref 6.0–8.5)

## 2020-08-06 LAB — LIPID PANEL
Chol/HDL Ratio: 3.7 ratio (ref 0.0–5.0)
Cholesterol, Total: 213 mg/dL — ABNORMAL HIGH (ref 100–199)
HDL: 58 mg/dL (ref >39)
LDL Chol Calc (NIH): 137 mg/dL — ABNORMAL HIGH (ref 0–99)
Triglycerides: 103 mg/dL (ref 0–149)
VLDL Cholesterol Cal: 18 mg/dL (ref 5–40)

## 2020-08-06 LAB — HEMOGLOBIN A1C
Est. average glucose Bld gHb Est-mCnc: 131 mg/dL
Hgb A1c MFr Bld: 6.2 % — ABNORMAL HIGH (ref 4.8–5.6)

## 2020-08-06 NOTE — Patient Instructions (Signed)
[name] , Thank you for taking time to come for your Medicare Wellness Visit. I appreciate your ongoing commitment to your health goals. Please review the following plan we discussed and let me know if I can assist you in the future.   Screening recommendations/referrals: Colonoscopy: completed 07/22/2014 Recommended yearly ophthalmology/optometry visit for glaucoma screening and checkup Recommended yearly dental visit for hygiene and checkup  Vaccinations: Influenza vaccine: completed 2021 per patient Pneumococcal vaccine: completed 07/26/2018 Tdap vaccine: completed 04/06/2015, due 04/05/2025 Shingles vaccine: completed   Covid-19:  04/14/2020, 08/06/2019, 07/16/2019  Advanced directives: Please bring a copy of your POA (Power of Attorney) and/or Living Will to your next appointment.   Conditions/risks identified: none  Next appointment: Follow up in one year for your annual wellness visit.   Preventive Care 21 Years and Older, Male Preventive care refers to lifestyle choices and visits with your health care provider that can promote health and wellness. What does preventive care include?  A yearly physical exam. This is also called an annual well check.  Dental exams once or twice a year.  Routine eye exams. Ask your health care provider how often you should have your eyes checked.  Personal lifestyle choices, including:  Daily care of your teeth and gums.  Regular physical activity.  Eating a healthy diet.  Avoiding tobacco and drug use.  Limiting alcohol use.  Practicing safe sex.  Taking low doses of aspirin every day.  Taking vitamin and mineral supplements as recommended by your health care provider. What happens during an annual well check? The services and screenings done by your health care provider during your annual well check will depend on your age, overall health, lifestyle risk factors, and family history of disease. Counseling  Your health care  provider may ask you questions about your:  Alcohol use.  Tobacco use.  Drug use.  Emotional well-being.  Home and relationship well-being.  Sexual activity.  Eating habits.  History of falls.  Memory and ability to understand (cognition).  Work and work Statistician. Screening  You may have the following tests or measurements:  Height, weight, and BMI.  Blood pressure.  Lipid and cholesterol levels. These may be checked every 5 years, or more frequently if you are over 78 years old.  Skin check.  Lung cancer screening. You may have this screening every year starting at age 23 if you have a 30-pack-year history of smoking and currently smoke or have quit within the past 15 years.  Fecal occult blood test (FOBT) of the stool. You may have this test every year starting at age 37.  Flexible sigmoidoscopy or colonoscopy. You may have a sigmoidoscopy every 5 years or a colonoscopy every 10 years starting at age 80.  Prostate cancer screening. Recommendations will vary depending on your family history and other risks.  Hepatitis C blood test.  Hepatitis B blood test.  Sexually transmitted disease (STD) testing.  Diabetes screening. This is done by checking your blood sugar (glucose) after you have not eaten for a while (fasting). You may have this done every 1-3 years.  Abdominal aortic aneurysm (AAA) screening. You may need this if you are a current or former smoker.  Osteoporosis. You may be screened starting at age 21 if you are at high risk. Talk with your health care provider about your test results, treatment options, and if necessary, the need for more tests. Vaccines  Your health care provider may recommend certain vaccines, such as:  Influenza vaccine. This  is recommended every year.  Tetanus, diphtheria, and acellular pertussis (Tdap, Td) vaccine. You may need a Td booster every 10 years.  Zoster vaccine. You may need this after age 70.  Pneumococcal  13-valent conjugate (PCV13) vaccine. One dose is recommended after age 1.  Pneumococcal polysaccharide (PPSV23) vaccine. One dose is recommended after age 37. Talk to your health care provider about which screenings and vaccines you need and how often you need them. This information is not intended to replace advice given to you by your health care provider. Make sure you discuss any questions you have with your health care provider. Document Released: 07/04/2015 Document Revised: 02/25/2016 Document Reviewed: 04/08/2015 Elsevier Interactive Patient Education  2017 [place] Prevention in the Home Falls can cause injuries. They can happen to people of all ages. There are many things you can do to make your home safe and to help prevent falls. What can I do on the outside of my home?  Regularly fix the edges of walkways and driveways and fix any cracks.  Remove anything that might make you trip as you walk through a door, such as a raised step or threshold.  Trim any bushes or trees on the path to your home.  Use bright outdoor lighting.  Clear any walking paths of anything that might make someone trip, such as rocks or tools.  Regularly check to see if handrails are loose or broken. Make sure that both sides of any steps have handrails.  Any raised decks and porches should have guardrails on the edges.  Have any leaves, snow, or ice cleared regularly.  Use sand or salt on walking paths during winter.  Clean up any spills in your garage right away. This includes oil or grease spills. What can I do in the bathroom?  Use night lights.  Install grab bars by the toilet and in the tub and shower. Do not use towel bars as grab bars.  Use non-skid mats or decals in the tub or shower.  If you need to sit down in the shower, use a plastic, non-slip stool.  Keep the floor dry. Clean up any water that spills on the floor as soon as it happens.  Remove soap buildup in the  tub or shower regularly.  Attach bath mats securely with double-sided non-slip rug tape.  Do not have throw rugs and other things on the floor that can make you trip. What can I do in the bedroom?  Use night lights.  Make sure that you have a light by your bed that is easy to reach.  Do not use any sheets or blankets that are too big for your bed. They should not hang down onto the floor.  Have a firm chair that has side arms. You can use this for support while you get dressed.  Do not have throw rugs and other things on the floor that can make you trip. What can I do in the kitchen?  Clean up any spills right away.  Avoid walking on wet floors.  Keep items that you use a lot in easy-to-reach places.  If you need to reach something above you, use a strong step stool that has a grab bar.  Keep electrical cords out of the way.  Do not use floor polish or wax that makes floors slippery. If you must use wax, use non-skid floor wax.  Do not have throw rugs and other things on the floor that can make you  trip. What can I do with my stairs?  Do not leave any items on the stairs.  Make sure that there are handrails on both sides of the stairs and use them. Fix handrails that are broken or loose. Make sure that handrails are as long as the stairways.  Check any carpeting to make sure that it is firmly attached to the stairs. Fix any carpet that is loose or worn.  Avoid having throw rugs at the top or bottom of the stairs. If you do have throw rugs, attach them to the floor with carpet tape.  Make sure that you have a light switch at the top of the stairs and the bottom of the stairs. If you do not have them, ask someone to add them for you. What else can I do to help prevent falls?  Wear shoes that:  Do not have high heels.  Have rubber bottoms.  Are comfortable and fit you well.  Are closed at the toe. Do not wear sandals.  If you use a stepladder:  Make sure that it  is fully opened. Do not climb a closed stepladder.  Make sure that both sides of the stepladder are locked into place.  Ask someone to hold it for you, if possible.  Clearly mark and make sure that you can see:  Any grab bars or handrails.  First and last steps.  Where the edge of each step is.  Use tools that help you move around (mobility aids) if they are needed. These include:  Canes.  Walkers.  Scooters.  Crutches.  Turn on the lights when you go into a dark area. Replace any light bulbs as soon as they burn out.  Set up your furniture so you have a clear path. Avoid moving your furniture around.  If any of your floors are uneven, fix them.  If there are any pets around you, be aware of where they are.  Review your medicines with your doctor. Some medicines can make you feel dizzy. This can increase your chance of falling. Ask your doctor what other things that you can do to help prevent falls. This information is not intended to replace advice given to you by your health care provider. Make sure you discuss any questions you have with your health care provider. Document Released: 04/03/2009 Document Revised: 11/13/2015 Document Reviewed: 07/12/2014 Elsevier Interactive Patient Education  2017 [employer]. .

## 2020-08-06 NOTE — Progress Notes (Signed)
I,Yamilka [employer] as a Education administrator for [employer], FNP.,have documented all relevant documentation on the behalf of Minette Brine, FNP,as directed by  Minette Brine, FNP while in the presence of Minette Brine, [place]. This visit occurred during the SARS-CoV-2 public health emergency.  Safety protocols were in place, including screening questions prior to the visit, additional usage of staff PPE, and extensive cleaning of exam room while observing appropriate contact time as indicated for disinfecting solutions.  Subjective:     Patient ID: Robert Donaldson , male    DOB: 1956/04/23 , 65 y.o.   MRN: [id number]   Chief Complaint  Patient presents with  . Hyperlipidemia  . abnormal glucose f/u     HPI  Patient presents today for a cholesterol and abnormal glucose f/u.  He is exercising regularly.    Wt Readings from Last 3 Encounters: 08/06/20 : 186 lb 4.6 oz (84.5 kg) 08/06/20 : 186 lb 3.2 oz (84.5 kg) 07/21/20 : 180 lb 9.6 oz (81.9 kg)    Hyperlipidemia This is a chronic problem. The current episode started more than 1 year ago. The problem is controlled. Recent lipid tests were reviewed and are high. He has no history of chronic renal disease. There are no known factors aggravating his hyperlipidemia. Pertinent negatives include no chest pain. Current antihyperlipidemic treatment includes exercise and statins. The current treatment provides significant improvement of lipids. There are no compliance problems.  Risk factors for coronary artery disease include male sex and dyslipidemia.     Past Medical History:  Diagnosis Date  . HLD (hyperlipidemia)   . Vitamin D deficiency      Family History  Problem Relation Age of Onset  . Hyperlipidemia Sister      Current Outpatient Medications:  .  celecoxib (CELEBREX) 200 MG capsule, Take 200 mg by mouth daily. (Patient not taking: No sig reported), Disp: , Rfl:  .  hydrocortisone cream 1 %, Apply to affected area 2 times daily,  Disp: 30 g, Rfl: 1 .  meloxicam (MOBIC) 7.5 MG tablet, Take by mouth. (Patient not taking: No sig reported), Disp: , Rfl:  .  Nutritional Supplements (VITAMIN D BOOSTER PO), Take 1 capsule by mouth daily., Disp: , Rfl:  .  rosuvastatin (CRESTOR) 5 MG tablet, TAKE 1 TABLET DAILY, Disp: 90 tablet, Rfl: 3 .  vitamin E 200 UNIT capsule, Take 5,000 Units by mouth daily. , Disp: , Rfl:    No Known Allergies   Review of Systems  Constitutional: Negative.   HENT: Negative.   Eyes: Negative.   Respiratory: Negative.   Cardiovascular: Negative.  Negative for chest pain.  Gastrointestinal: Negative.   Endocrine: Negative.  Negative for polydipsia, polyphagia and polyuria.  Genitourinary: Negative.   Musculoskeletal: Negative.   Skin: Negative.   Neurological: Negative.   Hematological: Negative.   Psychiatric/Behavioral: Negative.      Today's Vitals   08/06/20 0907  BP: 124/82  Pulse: (!) 57  Temp: 97.8 F (36.6 C)  Weight: 186 lb 4.6 oz (84.5 kg)  Height: 5' 7"  (1.702 m)  PainSc: 0-No pain   Body mass index is 29.18 kg/m.   Objective:  Physical Exam Constitutional:      General: He is not in acute distress.    Appearance: Normal appearance. He is normal weight.  Cardiovascular:     Rate and Rhythm: Normal rate and regular rhythm.     Pulses: Normal pulses.     Heart sounds: Normal heart sounds. No murmur heard.  Pulmonary:     Effort: Pulmonary effort is normal. No respiratory distress.     Breath sounds: Normal breath sounds. No wheezing.  Musculoskeletal:        General: No swelling. Normal range of motion.     Cervical back: Normal range of motion and neck supple.  Skin:    General: Skin is warm and dry.     Capillary Refill: Capillary refill takes less than 2 seconds.  Neurological:     General: No focal deficit present.     Mental Status: He is alert and oriented to person, place, and time.  Psychiatric:        Mood and Affect: Mood normal.        Behavior:  Behavior normal.        Thought Content: Thought content normal.        Judgment: Judgment normal.         Assessment And Plan:     1. Hyperlipidemia, unspecified hyperlipidemia type  Chronic, elevated at last visit   Will check lipid panel - Lipid panel - CMP14+EGFR  2. Abnormal glucose  Chronic, HgbA1c is slightly improved  Continue with regular exercise of at least 150 minutes.  Avoid sugary foods and drinks.   - Hemoglobin A1c  3. Rash and nonspecific skin eruption  He has an appt with [facility] in June  No new issues with rash    Patient was given opportunity to ask questions. Patient verbalized understanding of the plan and was able to repeat key elements of the plan. All questions were answered to their satisfaction.  Minette Brine, FNP   I, Minette Brine, FNP, have reviewed all documentation for this visit. The documentation on 08/06/20 for the exam, diagnosis, procedures, and orders are all accurate and complete.   THE PATIENT IS ENCOURAGED TO PRACTICE SOCIAL DISTANCING DUE TO THE COVID-19 PANDEMIC.

## 2020-08-06 NOTE — Progress Notes (Signed)
This visit occurred during the SARS-CoV-2 public health emergency.  Safety protocols were in place, including screening questions prior to the visit, additional usage of staff PPE, and extensive cleaning of exam room while observing appropriate contact time as indicated for disinfecting solutions.  Subjective:   Robert Donaldson is a 65 y.o. male who presents for Medicare Annual/Subsequent preventive examination.  Review of Systems     Cardiac Risk Factors include: advanced age (>71men, >39 women);dyslipidemia;male gender     Objective:    Today's Vitals   08/06/20 0909 08/06/20 0913  BP: 124/82   Pulse: (!) 57   Temp: 97.8 F (36.6 C)   TempSrc: Oral   SpO2: 98%   Weight: 186 lb 3.2 oz (84.5 kg)   Height: 5' 8.4" (1.737 m)   PainSc:  5    Body mass index is 27.98 kg/m.  Advanced Directives 08/06/2020 08/01/2019 07/26/2018 07/26/2018 04/06/2015  Does Patient Have a Medical Advance Directive? Yes Yes - No No  Type of Paramedic of [place];Living will [place];Living will - - -  Copy of [place] in Chart? No - copy requested No - copy requested - - -  Would patient like information on creating a medical advance directive? - - Yes (MAU/Ambulatory/Procedural Areas - Information given) Yes (MAU/Ambulatory/Procedural Areas - Information given) No - patient declined information    Current Medications (verified) Outpatient Encounter Medications as of 08/06/2020  Medication Sig  . hydrocortisone cream 1 % Apply to affected area 2 times daily  . Nutritional Supplements (VITAMIN D BOOSTER PO) Take 1 capsule by mouth daily.  . rosuvastatin (CRESTOR) 5 MG tablet TAKE 1 TABLET DAILY  . vitamin E 200 UNIT capsule Take 5,000 Units by mouth daily.   . celecoxib (CELEBREX) 200 MG capsule Take 200 mg by mouth daily. (Patient not taking: No sig reported)  . meloxicam (MOBIC) 7.5 MG tablet Take by mouth. (Patient not taking: No sig  reported)   No facility-administered encounter medications on file as of 08/06/2020.    Allergies (verified) Patient has no known allergies.   History: Past Medical History:  Diagnosis Date  . HLD (hyperlipidemia)   . Vitamin D deficiency    Past Surgical History:  Procedure Laterality Date  . KNEE SURGERY     Family History  Problem Relation Age of Onset  . Hyperlipidemia Sister    Social History   Socioeconomic History  . Marital status: Married    Spouse name: Not on file  . Number of children: Not on file  . Years of education: Not on file  . Highest education level: Not on file  Occupational History  . Occupation: retired  Tobacco Use  . Smoking status: Former Smoker    Types: Cigarettes  . Smokeless tobacco: Never Used  Vaping Use  . Vaping Use: Never used  Substance and Sexual Activity  . Alcohol use: Not Currently    Comment: occassionally  . Drug use: No  . Sexual activity: Yes  Other Topics Concern  . Not on file  Social History Narrative  . Not on file   Social Determinants of Health   Financial Resource Strain: Low Risk   . Difficulty of Paying Living Expenses: Not hard at all  Food Insecurity: No Food Insecurity  . Worried About Charity fundraiser in the Last Year: Never true  . Ran Out of Food in the Last Year: Never true  Transportation Needs: No Transportation Needs  .  Lack of Transportation (Medical): No  . Lack of Transportation (Non-Medical): No  Physical Activity: Sufficiently Active  . Days of Exercise per Week: 5 days  . Minutes of Exercise per Session: 120 min  Stress: No Stress Concern Present  . Feeling of Stress : Not at all  Social Connections: Not on file    Tobacco Counseling Counseling given: Not Answered   Clinical Intake:  Pre-visit preparation completed: Yes  Pain : 0-10 Pain Score: 5  Pain Type: Chronic pain Pain Location: Back Pain Orientation: Lower Pain Descriptors / Indicators: Aching Pain Onset:  More than a month ago Pain Frequency: Constant     Nutritional Status: BMI 25 -29 Overweight Nutritional Risks: None Diabetes: No  How often do you need to have someone help you when you read instructions, pamphlets, or other written materials from your doctor or pharmacy?: 1 - Never What is the last grade level you completed in school?: graduate school  Diabetic? no  Interpreter Needed?: No  Information entered by :: NAllen LPN   Activities of Daily Living In your present state of health, do you have any difficulty performing the following activities: 08/06/2020  Hearing? N  Vision? N  Difficulty concentrating or making decisions? N  Walking or climbing stairs? N  Dressing or bathing? N  Doing errands, shopping? N  Preparing Food and eating ? N  Using the Toilet? N  In the past six months, have you accidently leaked urine? N  Do you have problems with loss of bowel control? N  Managing your Medications? N  Managing your Finances? N  Some recent data might be hidden    Patient Care Team: Minette Brine, FNP as PCP - General (General Practice)  Indicate any recent Medical Services you may have received from other than [facility] providers in the past year (date may be approximate).     Assessment:   This is a routine wellness examination for [facility].  Hearing/Vision screen  Hearing Screening   125Hz  250Hz  500Hz  1000Hz  2000Hz  3000Hz  4000Hz  6000Hz  8000Hz   Right ear:           Left ear:           Vision Screening Comments: Regular eye exams, [facility]  Dietary issues and exercise activities discussed: Current Exercise Habits: Home exercise routine, Type of exercise: strength training/weights (elliptical), Time (Minutes): > 60, Frequency (Times/Week): 5, Weekly Exercise (Minutes/Week): 0  Goals    . Patient Stated     08/01/2019 wants to lose body fat    . Patient Stated     08/06/2020, wants to lose fat    . Weight (lb) < 170 lb (77.1 kg)     I would like to  lose my belly fat      Depression Screen PHQ 2/9 Scores 08/06/2020 08/01/2019 05/22/2019 01/24/2019 12/04/2018 09/12/2018  PHQ - 2 Score 0 0 0 0 0 0  PHQ- 9 Score - 0 - - - -    Fall Risk Fall Risk  08/06/2020 08/01/2019 05/22/2019 01/24/2019 12/04/2018  Falls in the past year? 0 0 0 0 0  Risk for fall due to : No Fall Risks - - - -  Follow up Falls evaluation completed;Education provided;Falls prevention discussed Falls evaluation completed;Education provided;Falls prevention discussed - - -    FALL RISK PREVENTION PERTAINING TO THE HOME:  Any stairs in or around the home? Yes  If so, are there any without handrails? No  Home free of loose throw rugs in walkways,  pet beds, electrical cords, etc? Yes  Adequate lighting in your home to reduce risk of falls? Yes   ASSISTIVE DEVICES UTILIZED TO PREVENT FALLS:  Life alert? No  Use of a cane, walker or w/c? No  Grab bars in the bathroom? Yes  Shower chair or bench in shower? No  Elevated toilet seat or a handicapped toilet? Yes   TIMED UP AND GO:  Was the test performed? No .   Gait steady and fast without use of assistive device  Cognitive Function: MMSE - Mini Mental State Exam 07/26/2018  Orientation to time 5  Orientation to Place 5  Registration 3  Attention/ Calculation 5  Recall 3  Language- name 2 objects 2  Language- repeat 1  Language- follow 3 step command 3  Language- read & follow direction 1  Write a sentence 1     6CIT Screen 08/06/2020 08/01/2019  What Year? 0 points 0 points  What month? 0 points 0 points  What time? 0 points 0 points  Count back from 20 0 points 0 points  Months in reverse 0 points 0 points  Repeat phrase 0 points 0 points  Total Score 0 0    Immunizations Immunization History  Administered Date(s) Administered  . Influenza, High Dose Seasonal PF 05/22/2019  . PFIZER(Purple Top)SARS-COV-2 Vaccination 07/16/2019, 08/06/2019, 04/14/2020  . Pneumococcal Polysaccharide-23 07/26/2018  . Tdap  04/06/2015    TDAP status: Up to date  Flu Vaccine status: Up to date  Pneumococcal vaccine status: Due, Education has been provided regarding the importance of this vaccine. Advised may receive this vaccine at local pharmacy or Health Dept. Aware to provide a copy of the vaccination record if obtained from local pharmacy or Health Dept. Verbalized acceptance and understanding.  Covid-19 vaccine status: Completed vaccines  Qualifies for Shingles Vaccine? Yes   Zostavax completed No   Shingrix Completed?: Yes  Screening Tests Health Maintenance  Topic Date Due  . PNA vac Low Risk Adult (2 of 2 - PCV13) 07/27/2019  . INFLUENZA VACCINE  01/20/2020  . COLONOSCOPY (Pts 45-26yrs Insurance coverage will need to be confirmed)  07/22/2024  . TETANUS/TDAP  04/05/2025  . COVID-19 Vaccine  Completed  . Hepatitis C Screening  Completed    Health Maintenance  Health Maintenance Due  Topic Date Due  . PNA vac Low Risk Adult (2 of 2 - PCV13) 07/27/2019  . INFLUENZA VACCINE  01/20/2020    Colorectal cancer screening: Type of screening: Colonoscopy. Completed 07/22/2014. Repeat every 10 years  Lung Cancer Screening: (Low Dose CT Chest recommended if Age 65-80 years, 30 pack-year currently smoking OR have quit w/in 15years.) does not qualify.   Lung Cancer Screening Referral: no  Additional Screening:  Hepatitis C Screening: does qualify; Completed 01/27/2018  Vision Screening: Recommended annual ophthalmology exams for early detection of glaucoma and other disorders of the eye. Is the patient up to date with their annual eye exam?  Yes  Who is the provider or what is the name of the office in which the patient attends annual eye exams? [facility] If pt is not established with a provider, would they like to be referred to a provider to establish care? No .   Dental Screening: Recommended annual dental exams for proper oral hygiene  Community Resource Referral / Chronic Care  Management: CRR required this visit?  No   CCM required this visit?  No      Plan:     I have personally reviewed  and noted the following in the patient's chart:   . Medical and social history . Use of alcohol, tobacco or illicit drugs  . Current medications and supplements . Functional ability and status . Nutritional status . Physical activity . Advanced directives . List of other physicians . Hospitalizations, surgeries, and ER visits in previous 12 months . Vitals . Screenings to include cognitive, depression, and falls . Referrals and appointments  In addition, I have reviewed and discussed with patient certain preventive protocols, quality metrics, and best practice recommendations. A written personalized care plan for preventive services as well as general preventive health recommendations were provided to patient.     Kellie Simmering, LPN   7/34/1939   Nurse Notes:

## 2020-09-09 ENCOUNTER — Encounter: Payer: Self-pay | Admitting: Nurse Practitioner

## 2020-09-29 ENCOUNTER — Other Ambulatory Visit: Payer: Self-pay | Admitting: Nurse Practitioner

## 2020-10-03 ENCOUNTER — Encounter: Payer: Self-pay | Admitting: Nurse Practitioner

## 2020-10-07 DIAGNOSIS — Z20822 Contact with and (suspected) exposure to covid-19: Secondary | ICD-10-CM | POA: Diagnosis not present

## 2020-10-25 ENCOUNTER — Encounter: Payer: Self-pay | Admitting: Nurse Practitioner

## 2020-11-02 DIAGNOSIS — Z20822 Contact with and (suspected) exposure to covid-19: Secondary | ICD-10-CM | POA: Diagnosis not present

## 2020-11-03 ENCOUNTER — Other Ambulatory Visit: Payer: Self-pay | Admitting: Nurse Practitioner

## 2020-11-03 MED ORDER — TRIAMCINOLONE ACETONIDE 0.025 % EX OINT
1.0000 | TOPICAL_OINTMENT | Freq: Two times a day (BID) | CUTANEOUS | 0 refills | Status: DC
Start: 2020-11-03 — End: 2022-04-12

## 2020-11-05 ENCOUNTER — Encounter: Payer: Self-pay | Admitting: Dermatology

## 2020-11-05 ENCOUNTER — Other Ambulatory Visit: Payer: Self-pay | Admitting: Nurse Practitioner

## 2020-11-05 DIAGNOSIS — R21 Rash and other nonspecific skin eruption: Secondary | ICD-10-CM

## 2020-11-05 MED ORDER — ACYCLOVIR 5 % EX CREA
1.0000 | TOPICAL_CREAM | Freq: Three times a day (TID) | CUTANEOUS | 2 refills | Status: DC
Start: 2020-11-05 — End: 2021-06-08

## 2020-12-08 ENCOUNTER — Encounter: Payer: Self-pay | Admitting: Nurse Practitioner

## 2020-12-11 ENCOUNTER — Other Ambulatory Visit: Payer: Self-pay | Admitting: Nurse Practitioner

## 2020-12-11 MED ORDER — PREDNISONE 10 MG (21) PO TBPK: ORAL_TABLET | ORAL | 0 refills | Status: DC

## 2020-12-16 ENCOUNTER — Ambulatory Visit (INDEPENDENT_AMBULATORY_CARE_PROVIDER_SITE_OTHER): Payer: Medicare Other | Admitting: Dermatology

## 2020-12-16 ENCOUNTER — Encounter: Payer: Self-pay | Admitting: Dermatology

## 2020-12-16 ENCOUNTER — Other Ambulatory Visit: Payer: Self-pay

## 2020-12-16 DIAGNOSIS — L439 Lichen planus, unspecified: Secondary | ICD-10-CM

## 2020-12-16 DIAGNOSIS — D485 Neoplasm of uncertain behavior of skin: Secondary | ICD-10-CM | POA: Diagnosis not present

## 2020-12-16 NOTE — Progress Notes (Signed)
New Patient   Subjective  Robert Donaldson is a 65 y.o. male who presents for the following: New Patient (Initial Visit) (Patient here today for rash on mid abdomen to left flank x 8 months per patient his PCP gave him a prescription of prednisone which he started today. His PCP also prescribed Acyclovir Cream which didn't help. Patient states he noticed the rash after getting his Shingles vaccine. ).  Rash on abdomen for 8 months, moderate pruritus Location:  Duration:  Quality:  Associated Signs/Symptoms: Modifying Factors:  Severity:  Timing: Context:    The following portions of the chart were reviewed this encounter and updated as appropriate:  Tobacco  Allergies  Meds  Problems  Med Hx  Surg Hx  Fam Hx      Objective  Well appearing patient in no apparent distress; mood and affect are within normal limits. 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  Discussed relationship of lichen planus with hepatitis C.     Left Inguinal Area     Left Lower Back 2 dermatomal linear lichenoid eruption.  No changes nails (he does have vertical melanonychia on several fingers) or intraoral.  I ran a literature search and could not find a case that was triggered by either shingles or shingles vaccine, but it is nonetheless fascinating that this developed sometime after he received the shingles vaccination.    All skin waist up examined.   Assessment & Plan  Neoplasm of uncertain behavior of skin (2) Left Abdomen (side) - Lower  Skin / nail biopsy Type of biopsy: tangential   Informed consent: discussed and consent obtained   Timeout: patient name, date of birth, surgical site, and procedure verified   Anesthesia: the lesion was anesthetized in a standard fashion   Anesthetic:  1% lidocaine w/ epinephrine 1-100,000 local infiltration Instrument used: flexible razor blade   Hemostasis achieved with:  ferric subsulfate   Outcome: patient tolerated procedure well   Post-procedure details: sterile dressing applied and wound care instructions given   Dressing type: bandage and petrolatum    Specimen 1 - Surgical pathology Differential Diagnosis: Linear lichen planus, hep c  Check Margins: No  Left Inguinal Area  Skin / nail biopsy Type of biopsy: tangential   Informed consent: discussed and consent obtained   Timeout: patient name, date of birth, surgical site, and procedure verified   Anesthesia: the lesion was anesthetized in a standard fashion   Anesthetic:  1% lidocaine w/ epinephrine 1-100,000 local infiltration Instrument used: flexible razor blade   Hemostasis achieved with: ferric subsulfate   Outcome: patient tolerated procedure well   Post-procedure details: sterile dressing applied and wound care instructions given   Dressing type: bandage and petrolatum    Specimen 2 - Surgical pathology Differential Diagnosis: Linear lichen planus, hep c   Check Margins: No  Lichen planus Left Lower Back  We will obtain biopsies to try and confirm the diagnosis.  Literature search.  Lichenoid linear eruption extending from left mid medial abdomen to left flank.  No vesicles.  Distinctly lichenoid.  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  Robert Donaldson was fine with obtaining a couple of shave biopsies.  He understands that this may not be diagnostic and even if it supports linear lichen planus treatment could be a challenge.  We even discussed the small association with hepatitis C.  I should have those results back in 8 days and I have asked [name] to please call my office in the middle of next week even though he can check for the result on my chart.  It is his decision whether to take the 6day taper of 10 mg prednisone given today by his primary care physician.  He knows he can contact me if there are any questions.

## 2020-12-16 NOTE — Patient Instructions (Addendum)
Linear lichen planus   Biopsy, Surgery (Curettage) & Surgery (Excision) Aftercare Instructions  1. Okay to remove bandage in 24 hours  2. Wash area with soap and water  3. Apply Vaseline to area twice daily until healed (Not Neosporin)  4. Okay to cover with a Band-Aid to decrease the chance of infection or prevent irritation from clothing; also it's okay to uncover lesion at home.  5. Suture instructions: return to our office in 7-10 or 10-14 days for a nurse visit for suture removal. Variable healing with sutures, if pain or itching occurs call our office. It's okay to shower or bathe 24 hours after sutures are given.  6. The following risks may occur after a biopsy, curettage or excision: bleeding, scarring, discoloration, recurrence, infection (redness, yellow drainage, pain or swelling).  7. For questions, concerns and results call our office at [place] before 4pm & Friday before 3pm. Biopsy results will be available in 1 week.

## 2020-12-24 ENCOUNTER — Telehealth: Payer: Self-pay

## 2020-12-24 DIAGNOSIS — Z79899 Other long term (current) drug therapy: Secondary | ICD-10-CM

## 2020-12-24 MED ORDER — CLOBETASOL PROP EMOLLIENT BASE 0.05 % EX CREA: TOPICAL_CREAM | CUTANEOUS | 4 refills | Status: DC

## 2020-12-24 NOTE — Telephone Encounter (Signed)
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  It is not cancer it is not an infection and is not contagious.  We will order some screening lab: CBC differential, c-Met, G6PD, hepatitis C serology and he will call us 2 to 3 days after he gets his blood drawn to review the results.  He may try using topical clobetasol (cream or foam).  This can be applied daily after bathing with the option of putting a moist towel over the clobetasol for 15 to 20 minutes and then after removing the towel and drying the area, reapply the cream.  Schedule a follow-up if not already done in 1 to 2 months.

## 2020-12-24 NOTE — Addendum Note (Signed)
Addended by: Sheran Lawless on: 12/24/2020 01:40 PM   Modules accepted: Orders

## 2020-12-24 NOTE — Telephone Encounter (Signed)
PATIENT AWARE OF DR TAFEENS MSG, AND MY CHART MSG SENT

## 2020-12-25 LAB — COMPREHENSIVE METABOLIC PANEL
AG Ratio: 1.5 (calc) (ref 1.0–2.5)
ALT: 35 U/L (ref 9–46)
AST: 27 U/L (ref 10–35)
Albumin: 4.4 g/dL (ref 3.6–5.1)
Alkaline phosphatase (APISO): 58 U/L (ref 35–144)
BUN/Creatinine Ratio: 15 (calc) (ref 6–22)
BUN: 19 mg/dL (ref 7–25)
CO2: 24 mmol/L (ref 20–32)
Calcium: 9.9 mg/dL (ref 8.6–10.3)
Chloride: 105 mmol/L (ref 98–110)
Creat: 1.29 mg/dL — ABNORMAL HIGH (ref 0.70–1.25)
Globulin: 2.9 g/dL (calc) (ref 1.9–3.7)
Glucose, Bld: 117 mg/dL — ABNORMAL HIGH (ref 65–99)
Potassium: 4.1 mmol/L (ref 3.5–5.3)
Sodium: 139 mmol/L (ref 135–146)
Total Bilirubin: 0.6 mg/dL (ref 0.2–1.2)
Total Protein: 7.3 g/dL (ref 6.1–8.1)

## 2020-12-25 LAB — CBC WITH DIFFERENTIAL/PLATELET
Absolute Monocytes: 587 cells/uL (ref 200–950)
Basophils Absolute: 36 cells/uL (ref 0–200)
Basophils Relative: 0.4 %
Eosinophils Absolute: 80 cells/uL (ref 15–500)
Eosinophils Relative: 0.9 %
HCT: 45.5 % (ref 38.5–50.0)
Hemoglobin: 15.3 g/dL (ref 13.2–17.1)
Lymphs Abs: 2501 cells/uL (ref 850–3900)
MCH: 31 pg (ref 27.0–33.0)
MCHC: 33.6 g/dL (ref 32.0–36.0)
MCV: 92.3 fL (ref 80.0–100.0)
MPV: 9.8 fL (ref 7.5–12.5)
Monocytes Relative: 6.6 %
Neutro Abs: 5696 cells/uL (ref 1500–7800)
Neutrophils Relative %: 64 %
Platelets: 238 10*3/uL (ref 140–400)
RBC: 4.93 10*6/uL (ref 4.20–5.80)
RDW: 13.2 % (ref 11.0–15.0)
Total Lymphocyte: 28.1 %
WBC: 8.9 10*3/uL (ref 3.8–10.8)

## 2020-12-25 LAB — GLUCOSE 6 PHOSPHATE DEHYDROGENASE: G-6PDH: 14 U/g Hgb (ref 7.0–20.5)

## 2020-12-25 LAB — HEPATITIS C ANTIBODY
Hepatitis C Ab: NONREACTIVE
SIGNAL TO CUT-OFF: 0.01 (ref <1.00)

## 2020-12-29 ENCOUNTER — Telehealth: Payer: Self-pay | Admitting: *Deleted

## 2020-12-29 NOTE — Telephone Encounter (Signed)
Blood results to patient- patient states he is using the topical medication.

## 2020-12-29 NOTE — Telephone Encounter (Signed)
-----   Message from Lavonna Monarch, MD sent at 12/28/2020  2:06 PM EDT ----- You can let patient know that his hepatitis C studies are normal.  He will try the topical therapy for the next 1 to 2 months and then we'll do follow-up. ----- Message ----- From: [facility] Lab Results In Sent: 12/25/2020  12:12 PM EDT To: Lavonna Monarch, MD

## 2021-01-02 ENCOUNTER — Encounter: Payer: Self-pay | Admitting: Nurse Practitioner

## 2021-01-03 ENCOUNTER — Encounter: Payer: Self-pay | Admitting: Dermatology

## 2021-01-04 ENCOUNTER — Encounter: Payer: Self-pay | Admitting: Dermatology

## 2021-01-05 DIAGNOSIS — M25572 Pain in left ankle and joints of left foot: Secondary | ICD-10-CM | POA: Diagnosis not present

## 2021-01-05 DIAGNOSIS — M79672 Pain in left foot: Secondary | ICD-10-CM | POA: Diagnosis not present

## 2021-01-17 ENCOUNTER — Encounter: Payer: Self-pay | Admitting: Dermatology

## 2021-01-19 DIAGNOSIS — M25571 Pain in right ankle and joints of right foot: Secondary | ICD-10-CM | POA: Diagnosis not present

## 2021-01-19 DIAGNOSIS — M79672 Pain in left foot: Secondary | ICD-10-CM | POA: Diagnosis not present

## 2021-01-19 DIAGNOSIS — M25572 Pain in left ankle and joints of left foot: Secondary | ICD-10-CM | POA: Diagnosis not present

## 2021-01-19 DIAGNOSIS — M67961 Unspecified disorder of synovium and tendon, right lower leg: Secondary | ICD-10-CM | POA: Diagnosis not present

## 2021-01-28 DIAGNOSIS — M79672 Pain in left foot: Secondary | ICD-10-CM | POA: Diagnosis not present

## 2021-01-28 DIAGNOSIS — M79671 Pain in right foot: Secondary | ICD-10-CM | POA: Diagnosis not present

## 2021-01-30 DIAGNOSIS — M79672 Pain in left foot: Secondary | ICD-10-CM | POA: Diagnosis not present

## 2021-01-30 DIAGNOSIS — M79671 Pain in right foot: Secondary | ICD-10-CM | POA: Diagnosis not present

## 2021-02-02 DIAGNOSIS — M79672 Pain in left foot: Secondary | ICD-10-CM | POA: Diagnosis not present

## 2021-02-02 DIAGNOSIS — M79671 Pain in right foot: Secondary | ICD-10-CM | POA: Diagnosis not present

## 2021-02-03 ENCOUNTER — Ambulatory Visit (INDEPENDENT_AMBULATORY_CARE_PROVIDER_SITE_OTHER): Payer: Medicare Other | Admitting: Nurse Practitioner

## 2021-02-03 ENCOUNTER — Encounter: Payer: Self-pay | Admitting: Nurse Practitioner

## 2021-02-03 ENCOUNTER — Other Ambulatory Visit: Payer: Self-pay

## 2021-02-03 VITALS — BP 136/88 | HR 57 | Temp 97.5°F | Ht 68.4 in | Wt 181.2 lb

## 2021-02-03 DIAGNOSIS — H40023 Open angle with borderline findings, high risk, bilateral: Secondary | ICD-10-CM | POA: Diagnosis not present

## 2021-02-03 DIAGNOSIS — E782 Mixed hyperlipidemia: Secondary | ICD-10-CM | POA: Diagnosis not present

## 2021-02-03 DIAGNOSIS — H2513 Age-related nuclear cataract, bilateral: Secondary | ICD-10-CM | POA: Diagnosis not present

## 2021-02-03 DIAGNOSIS — H524 Presbyopia: Secondary | ICD-10-CM | POA: Diagnosis not present

## 2021-02-03 DIAGNOSIS — R7309 Other abnormal glucose: Secondary | ICD-10-CM | POA: Diagnosis not present

## 2021-02-03 DIAGNOSIS — Z23 Encounter for immunization: Secondary | ICD-10-CM | POA: Diagnosis not present

## 2021-02-03 DIAGNOSIS — H5203 Hypermetropia, bilateral: Secondary | ICD-10-CM | POA: Diagnosis not present

## 2021-02-03 DIAGNOSIS — L438 Other lichen planus: Secondary | ICD-10-CM

## 2021-02-03 DIAGNOSIS — H52223 Regular astigmatism, bilateral: Secondary | ICD-10-CM | POA: Diagnosis not present

## 2021-02-03 DIAGNOSIS — H353112 Nonexudative age-related macular degeneration, right eye, intermediate dry stage: Secondary | ICD-10-CM | POA: Diagnosis not present

## 2021-02-03 MED ORDER — PNEUMOCOCCAL 13-VAL CONJ VACC IM SUSP: 0.5000 mL | INTRAMUSCULAR | 0 refills | Status: AC

## 2021-02-03 NOTE — Progress Notes (Signed)
I,Katawbba Wiggins,acting as a Education administrator for [employer], FNP.,have documented all relevant documentation on the behalf of Minette Brine, FNP,as directed by  Minette Brine, FNP while in the presence of Minette Brine, [place].  This visit occurred during the SARS-CoV-2 public health emergency.  Safety protocols were in place, including screening questions prior to the visit, additional usage of staff PPE, and extensive cleaning of exam room while observing appropriate contact time as indicated for disinfecting solutions.  Subjective:     Patient ID: Robert Donaldson , male    DOB: [birth year] , 65 y.o.   MRN: [id number]   Chief Complaint  Patient presents with   abnormal glucose    HPI  The patient is here today for a follow-up on his abnormal glucose and hyperlipidemia.  He is not exercising regularly during the pandemic.  He has also seen Dr. Denna Haggard for his rash and diagnosed with lichen planus.  He is applying cream daily.   Other    Past Medical History:  Diagnosis Date   HLD (hyperlipidemia)    Vitamin D deficiency      Family History  Problem Relation Age of Onset   Hyperlipidemia Sister      Current Outpatient Medications:    acyclovir cream (ZOVIRAX) 5 %, Apply 1 application topically 3 (three) times daily., Disp: 15 g, Rfl: 2   Clobetasol Prop Emollient Base 0.05 % emollient cream, Apply topically 2 (two) times a day if needed (Rash)., Disp: 45 g, Rfl: 4   hydrocortisone cream 1 %, Apply to affected area 2 times daily, Disp: 30 g, Rfl: 1   Nutritional Supplements (VITAMIN D BOOSTER PO), Take 1 capsule by mouth daily., Disp: , Rfl:    rosuvastatin (CRESTOR) 5 MG tablet, TAKE 1 TABLET DAILY, Disp: 90 tablet, Rfl: 3   triamcinolone (KENALOG) 0.025 % ointment, Apply 1 application topically 2 (two) times daily., Disp: 30 g, Rfl: 0   vitamin E 200 UNIT capsule, Take 5,000 Units by mouth daily. , Disp: , Rfl:    celecoxib (CELEBREX) 200 MG capsule, Take 200 mg by mouth daily. (Patient  not taking: No sig reported), Disp: , Rfl:    meloxicam (MOBIC) 7.5 MG tablet, Take by mouth. (Patient not taking: No sig reported), Disp: , Rfl:    No Known Allergies   Review of Systems  Constitutional: Negative.   Respiratory: Negative.    Cardiovascular: Negative.   Gastrointestinal: Negative.   Psychiatric/Behavioral: Negative.    All other systems reviewed and are negative.   Today's Vitals   02/03/21 0839  BP: 136/88  Pulse: (!) 57  Temp: (!) 97.5 F (36.4 C)  TempSrc: Oral  Weight: 181 lb 3.2 oz (82.2 kg)  Height: 5' 8.4" (1.737 m)  PainSc: 1   PainLoc: Ankle   Body mass index is 27.23 kg/m.  Wt Readings from Last 3 Encounters:  02/03/21 181 lb 3.2 oz (82.2 kg)  08/06/20 186 lb 4.6 oz (84.5 kg)  08/06/20 186 lb 3.2 oz (84.5 kg)    BP Readings from Last 3 Encounters:  02/03/21 136/88  08/06/20 124/82  08/06/20 124/82    Objective:  Physical Exam Constitutional:      General: He is not in acute distress.    Appearance: Normal appearance. He is normal weight.  Cardiovascular:     Rate and Rhythm: Normal rate and regular rhythm.     Pulses: Normal pulses.     Heart sounds: Normal heart sounds. No murmur heard. Pulmonary:  Effort: Pulmonary effort is normal. No respiratory distress.     Breath sounds: Normal breath sounds. No wheezing.  Musculoskeletal:        General: No swelling. Normal range of motion.     Cervical back: Normal range of motion and neck supple.  Skin:    General: Skin is warm and dry.     Capillary Refill: Capillary refill takes less than 2 seconds.  Neurological:     General: No focal deficit present.     Mental Status: He is alert and oriented to person, place, and time.     Cranial Nerves: No cranial nerve deficit.     Motor: No weakness.  Psychiatric:        Mood and Affect: Mood normal.        Behavior: Behavior normal.        Thought Content: Thought content normal.        Judgment: Judgment normal.        Assessment  And Plan:     1. Abnormal glucose Comments: Continue to focus on healthy diet and regular exercise - Hemoglobin A1c  2. Mixed hyperlipidemia Comments: Stable and tolerating statin well - BMP8+EGFR - Lipid panel  3. Linear lichen planus  4. Encounter for immunization - Pneumococcal conjugate vaccine 20-valent (Prevnar 20)    Patient was given opportunity to ask questions. Patient verbalized understanding of the plan and was able to repeat key elements of the plan. All questions were answered to their satisfaction.  Minette Brine, FNP   I, Minette Brine, FNP, have reviewed all documentation for this visit. The documentation on 02/03/21 for the exam, diagnosis, procedures, and orders are all accurate and complete.   IF YOU HAVE BEEN REFERRED TO A SPECIALIST, IT MAY TAKE 1-2 WEEKS TO SCHEDULE/PROCESS THE REFERRAL. IF YOU HAVE NOT HEARD FROM US/SPECIALIST IN TWO WEEKS, PLEASE GIVE [place] A CALL AT [phone].   THE PATIENT IS ENCOURAGED TO PRACTICE SOCIAL DISTANCING DUE TO THE COVID-19 PANDEMIC.

## 2021-02-03 NOTE — Patient Instructions (Signed)
Diabetes Mellitus and Nutrition, Adult When you have diabetes, or diabetes mellitus, it is very important to have healthy eating habits because your blood sugar (glucose) levels are greatly affected by what you eat and drink. Eating healthy foods in the right amounts, at about the same times every day, can help you:  Control your blood glucose.  Lower your risk of heart disease.  Improve your blood pressure.  Reach or maintain a healthy weight. What can affect my meal plan? Every person with diabetes is different, and each person has different needs for a meal plan. Your health care provider may recommend that you work with a dietitian to make a meal plan that is best for you. Your meal plan may vary depending on factors such as:  The calories you need.  The medicines you take.  Your weight.  Your blood glucose, blood pressure, and cholesterol levels.  Your activity level.  Other health conditions you have, such as heart or kidney disease. How do carbohydrates affect me? Carbohydrates, also called carbs, affect your blood glucose level more than any other type of food. Eating carbs naturally raises the amount of glucose in your blood. Carb counting is a method for keeping track of how many carbs you eat. Counting carbs is important to keep your blood glucose at a healthy level, especially if you use insulin or take certain oral diabetes medicines. It is important to know how many carbs you can safely have in each meal. This is different for every person. Your dietitian can help you calculate how many carbs you should have at each meal and for each snack. How does alcohol affect me? Alcohol can cause a sudden decrease in blood glucose (hypoglycemia), especially if you use insulin or take certain oral diabetes medicines. Hypoglycemia can be a life-threatening condition. Symptoms of hypoglycemia, such as sleepiness, dizziness, and confusion, are similar to symptoms of having too much  alcohol.  Do not drink alcohol if: ? Your health care provider tells you not to drink. ? You are pregnant, may be pregnant, or are planning to become pregnant.  If you drink alcohol: ? Do not drink on an empty stomach. ? Limit how much you use to:  0-1 drink a day for women.  0-2 drinks a day for men. ? Be aware of how much alcohol is in your drink. In the [place] one drink equals one 12 oz bottle of beer (355 mL), one 5 oz glass of wine (148 mL), or one 1 oz glass of hard liquor (44 mL). ? Keep yourself hydrated with water, diet soda, or unsweetened iced tea.  Keep in mind that regular soda, juice, and other mixers may contain a lot of sugar and must be counted as carbs. What are tips for following this plan? Reading food labels  Start by checking the serving size on the "Nutrition Facts" label of packaged foods and drinks. The amount of calories, carbs, fats, and other nutrients listed on the label is based on one serving of the item. Many items contain more than one serving per package.  Check the total grams (g) of carbs in one serving. You can calculate the number of servings of carbs in one serving by dividing the total carbs by 15. For example, if a food has 30 g of total carbs per serving, it would be equal to 2 servings of carbs.  Check the number of grams (g) of saturated fats and trans fats in one serving. Choose foods that have   a low amount or none of these fats.  Check the number of milligrams (mg) of salt (sodium) in one serving. Most people should limit total sodium intake to less than 2,300 mg per day.  Always check the nutrition information of foods labeled as "low-fat" or "nonfat." These foods may be higher in added sugar or refined carbs and should be avoided.  Talk to your dietitian to identify your daily goals for nutrients listed on the label. Shopping  Avoid buying canned, pre-made, or processed foods. These foods tend to be high in fat, sodium, and added  sugar.  Shop around the outside edge of the grocery store. This is where you will most often find fresh fruits and vegetables, bulk grains, fresh meats, and fresh dairy. Cooking  Use low-heat cooking methods, such as baking, instead of high-heat cooking methods like deep frying.  Cook using healthy oils, such as olive, canola, or sunflower oil.  Avoid cooking with butter, cream, or high-fat meats. Meal planning  Eat meals and snacks regularly, preferably at the same times every day. Avoid going long periods of time without eating.  Eat foods that are high in fiber, such as fresh fruits, vegetables, beans, and whole grains. Talk with your dietitian about how many servings of carbs you can eat at each meal.  Eat 4-6 oz (112-168 g) of lean protein each day, such as lean meat, chicken, fish, eggs, or tofu. One ounce (oz) of lean protein is equal to: ? 1 oz (28 g) of meat, chicken, or fish. ? 1 egg. ?  cup (62 g) of tofu.  Eat some foods each day that contain healthy fats, such as avocado, nuts, seeds, and fish.   What foods should I eat? Fruits Berries. Apples. Oranges. Peaches. Apricots. Plums. Grapes. Mango. Papaya. Pomegranate. Kiwi. Cherries. Vegetables Lettuce. Spinach. Leafy greens, including kale, chard, collard greens, and mustard greens. Beets. Cauliflower. Cabbage. Broccoli. Carrots. Green beans. Tomatoes. Peppers. Onions. Cucumbers. Brussels sprouts. Grains Whole grains, such as whole-wheat or whole-grain bread, crackers, tortillas, cereal, and pasta. Unsweetened oatmeal. Quinoa. Brown or wild rice. Meats and other proteins Seafood. Poultry without skin. Lean cuts of poultry and beef. Tofu. Nuts. Seeds. Dairy Low-fat or fat-free dairy products such as milk, yogurt, and cheese. The items listed above may not be a complete list of foods and beverages you can eat. Contact a dietitian for more information. What foods should I avoid? Fruits Fruits canned with  syrup. Vegetables Canned vegetables. Frozen vegetables with butter or cream sauce. Grains Refined white flour and flour products such as bread, pasta, snack foods, and cereals. Avoid all processed foods. Meats and other proteins Fatty cuts of meat. Poultry with skin. Breaded or fried meats. Processed meat. Avoid saturated fats. Dairy Full-fat yogurt, cheese, or milk. Beverages Sweetened drinks, such as soda or iced tea. The items listed above may not be a complete list of foods and beverages you should avoid. Contact a dietitian for more information. Questions to ask a health care provider  Do I need to meet with a diabetes educator?  Do I need to meet with a dietitian?  What number can I call if I have questions?  When are the best times to check my blood glucose? Where to find more information:  American Diabetes Association: diabetes.org  Academy of Nutrition and Dietetics: www.eatright.org  National Institute of Diabetes and Digestive and Kidney Diseases: www.niddk.nih.gov  Association of Diabetes Care and Education Specialists: www.diabeteseducator.org Summary  It is important to have healthy eating   habits because your blood sugar (glucose) levels are greatly affected by what you eat and drink.  A healthy meal plan will help you control your blood glucose and maintain a healthy lifestyle.  Your health care provider may recommend that you work with a dietitian to make a meal plan that is best for you.  Keep in mind that carbohydrates (carbs) and alcohol have immediate effects on your blood glucose levels. It is important to count carbs and to use alcohol carefully. This information is not intended to replace advice given to you by your health care provider. Make sure you discuss any questions you have with your health care provider. Document Revised: 05/15/2019 Document Reviewed: 05/15/2019 Elsevier Patient Education  2021 Elsevier Inc.  

## 2021-02-04 DIAGNOSIS — M79671 Pain in right foot: Secondary | ICD-10-CM | POA: Diagnosis not present

## 2021-02-04 DIAGNOSIS — M79672 Pain in left foot: Secondary | ICD-10-CM | POA: Diagnosis not present

## 2021-02-04 LAB — BMP8+EGFR
BUN/Creatinine Ratio: 13 (ref 10–24)
BUN: 14 mg/dL (ref 8–27)
CO2: 21 mmol/L (ref 20–29)
Calcium: 9.2 mg/dL (ref 8.6–10.2)
Chloride: 104 mmol/L (ref 96–106)
Creatinine, Ser: 1.09 mg/dL (ref 0.76–1.27)
Glucose: 106 mg/dL — ABNORMAL HIGH (ref 65–99)
Potassium: 4.4 mmol/L (ref 3.5–5.2)
Sodium: 139 mmol/L (ref 134–144)
eGFR: 74 mL/min/{1.73_m2} (ref >59)

## 2021-02-04 LAB — HEMOGLOBIN A1C
Est. average glucose Bld gHb Est-mCnc: 131 mg/dL
Hgb A1c MFr Bld: 6.2 % — ABNORMAL HIGH (ref 4.8–5.6)

## 2021-02-04 LAB — LIPID PANEL
Chol/HDL Ratio: 3.2 ratio (ref 0.0–5.0)
Cholesterol, Total: 223 mg/dL — ABNORMAL HIGH (ref 100–199)
HDL: 69 mg/dL (ref >39)
LDL Chol Calc (NIH): 134 mg/dL — ABNORMAL HIGH (ref 0–99)
Triglycerides: 112 mg/dL (ref 0–149)
VLDL Cholesterol Cal: 20 mg/dL (ref 5–40)

## 2021-02-06 DIAGNOSIS — M79672 Pain in left foot: Secondary | ICD-10-CM | POA: Diagnosis not present

## 2021-02-06 DIAGNOSIS — M79671 Pain in right foot: Secondary | ICD-10-CM | POA: Diagnosis not present

## 2021-02-09 ENCOUNTER — Encounter: Payer: Self-pay | Admitting: Nurse Practitioner

## 2021-02-09 DIAGNOSIS — M79671 Pain in right foot: Secondary | ICD-10-CM | POA: Diagnosis not present

## 2021-02-09 DIAGNOSIS — M79672 Pain in left foot: Secondary | ICD-10-CM | POA: Diagnosis not present

## 2021-02-11 DIAGNOSIS — M79672 Pain in left foot: Secondary | ICD-10-CM | POA: Diagnosis not present

## 2021-02-11 DIAGNOSIS — M79671 Pain in right foot: Secondary | ICD-10-CM | POA: Diagnosis not present

## 2021-02-13 DIAGNOSIS — M79672 Pain in left foot: Secondary | ICD-10-CM | POA: Diagnosis not present

## 2021-02-13 DIAGNOSIS — M79671 Pain in right foot: Secondary | ICD-10-CM | POA: Diagnosis not present

## 2021-02-17 ENCOUNTER — Encounter: Payer: Self-pay | Admitting: Nurse Practitioner

## 2021-03-06 DIAGNOSIS — M67962 Unspecified disorder of synovium and tendon, left lower leg: Secondary | ICD-10-CM | POA: Diagnosis not present

## 2021-03-06 DIAGNOSIS — M67961 Unspecified disorder of synovium and tendon, right lower leg: Secondary | ICD-10-CM | POA: Diagnosis not present

## 2021-03-06 DIAGNOSIS — M76829 Posterior tibial tendinitis, unspecified leg: Secondary | ICD-10-CM | POA: Diagnosis not present

## 2021-04-10 DIAGNOSIS — M659 Synovitis and tenosynovitis, unspecified: Secondary | ICD-10-CM | POA: Diagnosis not present

## 2021-04-30 DIAGNOSIS — Z23 Encounter for immunization: Secondary | ICD-10-CM | POA: Diagnosis not present

## 2021-05-18 ENCOUNTER — Encounter: Payer: Self-pay | Admitting: Dermatology

## 2021-05-18 ENCOUNTER — Other Ambulatory Visit: Payer: Self-pay

## 2021-05-18 ENCOUNTER — Ambulatory Visit (INDEPENDENT_AMBULATORY_CARE_PROVIDER_SITE_OTHER): Payer: Medicare Other | Admitting: Dermatology

## 2021-05-18 DIAGNOSIS — L439 Lichen planus, unspecified: Secondary | ICD-10-CM | POA: Diagnosis not present

## 2021-05-18 DIAGNOSIS — M659 Synovitis and tenosynovitis, unspecified: Secondary | ICD-10-CM | POA: Diagnosis not present

## 2021-05-18 DIAGNOSIS — M25832 Other specified joint disorders, left wrist: Secondary | ICD-10-CM | POA: Diagnosis not present

## 2021-05-18 MED ORDER — CLOBETASOL PROP EMOLLIENT BASE 0.05 % EX CREA: TOPICAL_CREAM | CUTANEOUS | 4 refills | Status: DC

## 2021-06-06 ENCOUNTER — Encounter: Payer: Self-pay | Admitting: Dermatology

## 2021-06-06 NOTE — Progress Notes (Signed)
° °  Follow-Up Visit   Subjective  Robert Donaldson is a 65 y.o. male who presents for the following: Follow-up (Rash moved to upper thighs treatment clobetasol 0.05%).  Lichen planus, new spots on legs Location:  Duration:  Quality:  Associated Signs/Symptoms: Modifying Factors:  Severity:  Timing: Context:   Objective  Well appearing patient in no apparent distress; mood and affect are within normal limits. Left Abdomen (side) - Lower, Right Thigh - Anterior Although I agree with the patient that there are some new small lichenoid papules suggestive of more typical lichen planus, the linear extensive rash on the left abdomen is actually 75% flatter which is a very good response to topical therapy.    A focused examination was performed including abdomen legs, arms. Relevant physical exam findings are noted in the Assessment and Plan.   Assessment & Plan    Lichen planus Right Thigh - Anterior; Left Abdomen (side) - Lower  Continue daily clobetasol after bathing, avoid use in groin on face .  Next follow-up by MyChart in 6 weeks.  Clobetasol Prop Emollient Base 0.05 % emollient cream - Left Abdomen (side) - Lower, Right Thigh - Anterior Apply topically 2 (two) times a day if needed (Rash).      I, Lavonna Monarch, MD, have reviewed all documentation for this visit.  The documentation on 06/06/21 for the exam, diagnosis, procedures, and orders are all accurate and complete.

## 2021-06-08 ENCOUNTER — Other Ambulatory Visit: Payer: Self-pay

## 2021-06-08 ENCOUNTER — Ambulatory Visit (INDEPENDENT_AMBULATORY_CARE_PROVIDER_SITE_OTHER): Payer: Medicare Other | Admitting: Nurse Practitioner

## 2021-06-08 ENCOUNTER — Encounter: Payer: Self-pay | Admitting: Nurse Practitioner

## 2021-06-08 VITALS — BP 122/60 | HR 62 | Temp 98.1°F | Ht 68.4 in | Wt 182.2 lb

## 2021-06-08 DIAGNOSIS — Z23 Encounter for immunization: Secondary | ICD-10-CM | POA: Diagnosis not present

## 2021-06-08 DIAGNOSIS — R7309 Other abnormal glucose: Secondary | ICD-10-CM | POA: Diagnosis not present

## 2021-06-08 DIAGNOSIS — E782 Mixed hyperlipidemia: Secondary | ICD-10-CM

## 2021-06-08 LAB — LIPID PANEL
Chol/HDL Ratio: 4.4 ratio (ref 0.0–5.0)
Cholesterol, Total: 240 mg/dL — ABNORMAL HIGH (ref 100–199)
HDL: 55 mg/dL (ref >39)
LDL Chol Calc (NIH): 165 mg/dL — ABNORMAL HIGH (ref 0–99)
Triglycerides: 112 mg/dL (ref 0–149)
VLDL Cholesterol Cal: 20 mg/dL (ref 5–40)

## 2021-06-08 LAB — HEMOGLOBIN A1C
Est. average glucose Bld gHb Est-mCnc: 126 mg/dL
Hgb A1c MFr Bld: 6 % — ABNORMAL HIGH (ref 4.8–5.6)

## 2021-06-08 MED ORDER — PNEUMOCOCCAL 13-VAL CONJ VACC IM SUSP: 0.5000 mL | INTRAMUSCULAR | 0 refills | Status: AC

## 2021-06-08 NOTE — Progress Notes (Signed)
I,Tianna Badgett,acting as a Education administrator for [employer], FNP.,have documented all relevant documentation on the behalf of Minette Brine, FNP,as directed by  Minette Brine, FNP while in the presence of Minette Brine, [place].  This visit occurred during the SARS-CoV-2 public health emergency.  Safety protocols were in place, including screening questions prior to the visit, additional usage of staff PPE, and extensive cleaning of exam room while observing appropriate contact time as indicated for disinfecting solutions.  Subjective:     Patient ID: Robert Donaldson , male    DOB: Aug 06, 1955 , 65 y.o.   MRN: [id number]   Chief Complaint  Patient presents with   Prediabetes    HPI  The patient is here today for a follow-up on his abnormal glucose and hyperlipidemia.  Denies any concerns. He exercises daily for about 1.5-2 hours. Will do aerobics 45 minutes.   Diabetes He presents for his follow-up diabetic visit. Diabetes type: prediabetes. There are no hypoglycemic associated symptoms. There are no diabetic associated symptoms. There are no hypoglycemic complications. There are no diabetic complications. Risk factors for coronary artery disease include sedentary lifestyle. Current diabetic treatment includes diet. When asked about meal planning, he reported none. He has not had a previous visit with a dietitian. An ACE inhibitor/angiotensin II receptor blocker is being taken. He does not see a podiatrist.Eye exam is not current.    Past Medical History:  Diagnosis Date   HLD (hyperlipidemia)    Vitamin D deficiency      Family History  Problem Relation Age of Onset   Hyperlipidemia Sister      Current Outpatient Medications:    celecoxib (CELEBREX) 200 MG capsule, Take 200 mg by mouth daily., Disp: , Rfl:    Clobetasol Prop Emollient Base 0.05 % emollient cream, Apply topically 2 (two) times a day if needed (Rash)., Disp: 45 g, Rfl: 4   meloxicam (MOBIC) 7.5 MG tablet, Take by mouth., Disp: ,  Rfl:    Nutritional Supplements (VITAMIN D BOOSTER PO), Take 1 capsule by mouth daily., Disp: , Rfl:    pneumococcal 13-valent conjugate vaccine (PREVNAR 13) SUSP injection, Inject 0.5 mLs into the muscle tomorrow at 10 am for 1 dose., Disp: 0.5 mL, Rfl: 0   rosuvastatin (CRESTOR) 5 MG tablet, TAKE 1 TABLET DAILY, Disp: 90 tablet, Rfl: 3   triamcinolone (KENALOG) 0.025 % ointment, Apply 1 application topically 2 (two) times daily., Disp: 30 g, Rfl: 0   vitamin E 200 UNIT capsule, Take 5,000 Units by mouth daily. , Disp: , Rfl:    No Known Allergies   Review of Systems  Constitutional: Negative.   Respiratory: Negative.    Cardiovascular: Negative.   Gastrointestinal: Negative.   Neurological: Negative.     Today's Vitals   06/08/21 0904  BP: 122/60  Pulse: 62  Temp: 98.1 F (36.7 C)  TempSrc: Oral  Weight: 182 lb 3.2 oz (82.6 kg)  Height: 5' 8.4" (1.737 m)   Body mass index is 27.38 kg/m.  Wt Readings from Last 3 Encounters:  06/08/21 182 lb 3.2 oz (82.6 kg)  02/03/21 181 lb 3.2 oz (82.2 kg)  08/06/20 186 lb 4.6 oz (84.5 kg)    Objective:  Physical Exam Vitals reviewed.  Constitutional:      General: He is not in acute distress.    Appearance: Normal appearance.  Cardiovascular:     Rate and Rhythm: Normal rate and regular rhythm.     Pulses: Normal pulses.     Heart sounds:  Normal heart sounds. No murmur heard. Pulmonary:     Effort: Pulmonary effort is normal. No respiratory distress.     Breath sounds: Normal breath sounds. No wheezing.  Skin:    General: Skin is warm and dry.  Neurological:     General: No focal deficit present.     Mental Status: He is alert and oriented to person, place, and time.     Cranial Nerves: No cranial nerve deficit.     Motor: No weakness.  Psychiatric:        Mood and Affect: Mood normal.        Behavior: Behavior normal.        Thought Content: Thought content normal.        Judgment: Judgment normal.        Assessment  And Plan:     1. Mixed hyperlipidemia Comments: Stable, continue with current medications - Lipid panel  2. Abnormal glucose Comments: Stable at 6.2, diet controlled. No current medications. Continue exercising regularly - Hemoglobin A1c  3. Encounter for immunization - pneumococcal 13-valent conjugate vaccine (PREVNAR 13) SUSP injection; Inject 0.5 mLs into the muscle tomorrow at 10 am for 1 dose.  Dispense: 0.5 mL; Refill: 0     Patient was given opportunity to ask questions. Patient verbalized understanding of the plan and was able to repeat key elements of the plan. All questions were answered to their satisfaction.  Minette Brine, FNP   I, Minette Brine, FNP, have reviewed all documentation for this visit. The documentation on 06/08/21 for the exam, diagnosis, procedures, and orders are all accurate and complete.   IF YOU HAVE BEEN REFERRED TO A SPECIALIST, IT MAY TAKE 1-2 WEEKS TO SCHEDULE/PROCESS THE REFERRAL. IF YOU HAVE NOT HEARD FROM US/SPECIALIST IN TWO WEEKS, PLEASE GIVE [place] A CALL AT [phone].   THE PATIENT IS ENCOURAGED TO PRACTICE SOCIAL DISTANCING DUE TO THE COVID-19 PANDEMIC.

## 2021-06-08 NOTE — Patient Instructions (Signed)
Preventing Type 2 Diabetes Mellitus °Type 2 diabetes, also called type 2 diabetes mellitus, is a long-term (chronic) disease that affects sugar (glucose) levels in your blood. Normally, a hormone called insulin allows glucose to enter cells in your body. The cells use glucose for energy. With type 2 diabetes, you will have one or both of these problems: °Your pancreas does not make enough insulin. °Cells in your body do not respond properly to insulin that your body makes (insulin resistance). °Insulin resistance or lack of insulin causes extra glucose to build up in the blood instead of going into cells. As a result, high blood glucose (hyperglycemia) develops. That can cause many complications. Being overweight or obese and having an inactive (sedentary) lifestyle can increase your risk for diabetes. Type 2 diabetes can be delayed or prevented by making certain nutrition and lifestyle changes. °How can this condition affect me? °If you do not take steps to prevent diabetes, your blood glucose levels may keep increasing over time. Too much glucose in your blood for a long time can damage your blood vessels, heart, kidneys, nerves, and eyes. °Type 2 diabetes can lead to chronic health problems and complications, such as: °Heart disease. °Stroke. °Blindness. °Kidney disease. °Depression. °Poor circulation in your feet and legs. In severe cases, a foot or leg may need to be surgically removed (amputated). °What can increase my risk? °You may be more likely to develop type 2 diabetes if you: °Have type 2 diabetes in your family. °Are overweight or obese. °Have a sedentary lifestyle. °Have insulin resistance or a history of prediabetes. °Have a history of pregnancy-related (gestational) diabetes or polycystic ovary syndrome (PCOS). °What actions can I take to prevent this? °It can be difficult to recognize signs of type 2 diabetes. Taking action to prevent the disease before you develop symptoms is the best way to avoid  possible damage to your body. Making certain nutrition and lifestyle changes may prevent or delay the disease and related health problems. °Nutrition ° °Eat healthy meals and snacks regularly. Do not skip meals. Fruit or a handful of nuts is a healthy snack between meals. °Drink water throughout the day. Avoid drinks that contain added sugar, such as soda or sweetened tea. Drink enough fluid to keep your urine pale yellow. °Follow instructions from your health care provider about eating or drinking restrictions. °Limit the amount of food you eat by: °Managing how much you eat at a time (portion size). °Checking food labels for the serving sizes of food. °Using a kitchen scale to weigh amounts of food. °Sauté or steam food instead of frying it. Cook with water or broth instead of oils or butter. °Limit saturated fat and salt (sodium) in your diet. Have no more than 1 tsp (2,400 mg) of sodium a day. If you have heart disease or high blood pressure, use less than ½?¾ tsp (1,500 mg) of sodium a day. °Lifestyle ° °Lose weight if needed and as told. Your health care provider can determine how much weight loss is best for you and can help you lose weight safely. °If you are overweight or obese, you may be told to lose at least 5?7% of your body weight. °Manage blood pressure, cholesterol, and stress. Your health care provider will help determine the best treatment for you. °Do not use any products that contain nicotine or tobacco. These products include cigarettes, chewing tobacco, and vaping devices, such as e-cigarettes. If you need help quitting, ask your health care provider. °Activity ° °Do physical   activity that makes your heart beat faster and makes you sweat (moderate intensity). Do this for at least 30 minutes on at least 5 days of the week, or as much as told by your health care provider. °Ask your health care provider what activities are safe for you. A mix of activities may be best, such as walking, swimming,  cycling, and strength training. °Try to add physical activity into your day. For example: °Park your car farther away than usual so that you walk more. °Take a walk during your lunch break. °Use stairs instead of elevators or escalators. °Walk or bike to work instead of driving. °Alcohol use °If you drink alcohol: °Limit how much you have to: °0?1 drink a day for women who are not pregnant. °0?2 drinks a day for men. °Know how much alcohol is in your drink. In the [place] one drink equals one 12 oz bottle of beer (355 mL), one 5 oz glass of wine (148 mL), or one 1½ oz glass of hard liquor (44 mL). °General information °Talk with your health care provider about your risk factors and how you can reduce your risk for diabetes. °Have your blood glucose tested regularly, as told by your health care provider. °Get screening tests as told by your health care provider. You may have these regularly, especially if you have certain risk factors for type 2 diabetes. °Make an appointment with a registered dietitian. This diet and nutrition specialist can help you make a healthy eating plan and help you understand portion sizes and food labels. °Where to find support °Ask your health care provider to recommend a registered dietitian, a certified diabetes care and education specialist, or a weight loss program. °Look for local or online weight loss groups. °Join a gym, fitness club, or outdoor activity group, such as a walking club. °Where to find more information °For help and guidance and to learn more about diabetes and diabetes prevention, visit: °American Diabetes Association (ADA): www.diabetes.org °National Institute of Diabetes and Digestive and Kidney Diseases: www.niddk.nih.gov °To learn more about healthy eating, visit: °[place]. Department of Agriculture (USDA): www.choosemyplate.gov °Office of Disease Prevention and Health Promotion (ODPHP): health.gov °Summary °You can delay or prevent type 2 diabetes by eating healthy  foods, losing weight if needed, and increasing your physical activity. °Talk with your health care provider about your risk factors for type 2 diabetes and how you can reduce your risk. °It can be difficult to recognize the signs of type 2 diabetes. The best way to avoid possible damage to your body is to take action to prevent the disease before you develop symptoms. °Get screening tests as told by your health care provider. °This information is not intended to replace advice given to you by your health care provider. Make sure you discuss any questions you have with your health care provider. °Document Revised: 09/01/2020 Document Reviewed: 09/01/2020 °Elsevier Patient Education © 2022 Elsevier Inc. ° °

## 2021-06-21 DIAGNOSIS — R143 Flatulence: Secondary | ICD-10-CM | POA: Insufficient documentation

## 2021-07-14 ENCOUNTER — Encounter: Payer: Self-pay | Admitting: Nurse Practitioner

## 2021-07-14 ENCOUNTER — Encounter: Payer: Self-pay | Admitting: Dermatology

## 2021-07-30 ENCOUNTER — Telehealth: Payer: Self-pay

## 2021-07-30 NOTE — Telephone Encounter (Signed)
I called the pt to schedule an appt, the pt said he couldn't make it for the available appt today, the pt was told that he has an appt scheduled for the 23rd, the pt said that he would come on that date, the pt said he didn't feel bad and he was told to contact the office if anything changes.

## 2021-08-13 ENCOUNTER — Encounter: Payer: Self-pay | Admitting: Nurse Practitioner

## 2021-08-13 ENCOUNTER — Ambulatory Visit (INDEPENDENT_AMBULATORY_CARE_PROVIDER_SITE_OTHER): Payer: Medicare PPO | Admitting: Nurse Practitioner

## 2021-08-13 ENCOUNTER — Ambulatory Visit (INDEPENDENT_AMBULATORY_CARE_PROVIDER_SITE_OTHER): Payer: Medicare PPO

## 2021-08-13 ENCOUNTER — Other Ambulatory Visit: Payer: Self-pay

## 2021-08-13 VITALS — BP 120/64 | HR 59 | Temp 98.2°F | Ht 68.0 in | Wt 184.0 lb

## 2021-08-13 VITALS — BP 120/64 | HR 59 | Temp 98.0°F | Ht 68.0 in | Wt 184.6 lb

## 2021-08-13 DIAGNOSIS — Z0000 Encounter for general adult medical examination without abnormal findings: Secondary | ICD-10-CM | POA: Diagnosis not present

## 2021-08-13 DIAGNOSIS — R21 Rash and other nonspecific skin eruption: Secondary | ICD-10-CM

## 2021-08-13 DIAGNOSIS — L438 Other lichen planus: Secondary | ICD-10-CM | POA: Diagnosis not present

## 2021-08-13 DIAGNOSIS — R3913 Splitting of urinary stream: Secondary | ICD-10-CM

## 2021-08-13 DIAGNOSIS — L299 Pruritus, unspecified: Secondary | ICD-10-CM

## 2021-08-13 DIAGNOSIS — Z23 Encounter for immunization: Secondary | ICD-10-CM

## 2021-08-13 DIAGNOSIS — M545 Low back pain, unspecified: Secondary | ICD-10-CM

## 2021-08-13 LAB — POCT URINALYSIS DIPSTICK
Bilirubin, UA: NEGATIVE
Blood, UA: NEGATIVE
Glucose, UA: NEGATIVE
Ketones, UA: NEGATIVE
Leukocytes, UA: NEGATIVE
Nitrite, UA: NEGATIVE
Protein, UA: NEGATIVE
Spec Grav, UA: 1.025 (ref 1.010–1.025)
Urobilinogen, UA: 0.2 E.U./dL
pH, UA: 5.5 (ref 5.0–8.0)

## 2021-08-13 MED ORDER — CYCLOBENZAPRINE HCL 10 MG PO TABS: 10.0000 mg | ORAL_TABLET | Freq: Three times a day (TID) | ORAL | 0 refills | Status: AC | PRN

## 2021-08-13 MED ORDER — DICLOFENAC SODIUM 1 % EX GEL: 2.0000 g | Freq: Four times a day (QID) | CUTANEOUS | 2 refills | Status: AC

## 2021-08-13 NOTE — Progress Notes (Signed)
I,Tianna Badgett,acting as a Education administrator for [employer], FNP.,have documented all relevant documentation on the behalf of Minette Brine, FNP,as directed by  Minette Brine, FNP while in the presence of Minette Brine, [place].  This visit occurred during the SARS-CoV-2 public health emergency. Safety protocols were in place, including screening questions prior to the visit, additional usage of staff PPE, and extensive cleaning of exam room while observing appropriate contact time as indicated for disinfecting solutions.  Subjective:     Patient ID: Robert Donaldson , male    DOB: 12/29/1955 , 66 y.o.   MRN: [id number]   Chief Complaint  Patient presents with   Hyperlipidemia    HPI  Here today had his AWV done by [facility] Concerned about his urinary stream not being strong and is now splitting.     Past Medical History:  Diagnosis Date   HLD (hyperlipidemia)    Vitamin D deficiency      Family History  Problem Relation Age of Onset   Hyperlipidemia Sister      Current Outpatient Medications:    cyclobenzaprine (FLEXERIL) 10 MG tablet, Take 1 tablet (10 mg total) by mouth 3 (three) times daily as needed for muscle spasms., Disp: 30 tablet, Rfl: 0   diclofenac Sodium (VOLTAREN) 1 % GEL, Apply 2 g topically 4 (four) times daily., Disp: 100 g, Rfl: 2   celecoxib (CELEBREX) 200 MG capsule, Take 200 mg by mouth daily. Takes as needed, Disp: , Rfl:    Clobetasol Prop Emollient Base 0.05 % emollient cream, Apply topically 2 (two) times a day if needed (Rash)., Disp: 45 g, Rfl: 4   meloxicam (MOBIC) 7.5 MG tablet, Take by mouth. As needed, Disp: , Rfl:    Nutritional Supplements (VITAMIN D BOOSTER PO), Take 1 capsule by mouth daily., Disp: , Rfl:    rosuvastatin (CRESTOR) 5 MG tablet, TAKE 1 TABLET DAILY, Disp: 90 tablet, Rfl: 3   triamcinolone (KENALOG) 0.025 % ointment, Apply 1 application topically 2 (two) times daily., Disp: 30 g, Rfl: 0   vitamin E 200 UNIT capsule, Take 5,000 Units by mouth daily.  , Disp: , Rfl:    No Known Allergies   Review of Systems  Constitutional: Negative.  Negative for fever.  Respiratory: Negative.    Cardiovascular: Negative.   Gastrointestinal: Negative.   Genitourinary:        Urine stream change and is now splitting for the last year.   Skin:  Positive for rash (left abdomen and has 'insect bites" to his legs he is concerned about.).  Neurological: Negative.     Today's Vitals   08/13/21 0908  BP: 120/64  Pulse: (!) 59  Temp: 98.2 F (36.8 C)  TempSrc: Oral  Weight: 184 lb (83.5 kg)  Height: 5\' 8"  (1.727 m)   Body mass index is 27.98 kg/m.   Objective:  Physical Exam Vitals reviewed.  Constitutional:      General: He is not in acute distress.    Appearance: Normal appearance. He is normal weight.  Cardiovascular:     Rate and Rhythm: Normal rate and regular rhythm.     Pulses: Normal pulses.     Heart sounds: Normal heart sounds. No murmur heard. Pulmonary:     Effort: Pulmonary effort is normal. No respiratory distress.     Breath sounds: Normal breath sounds. No wheezing.  Musculoskeletal:        General: No swelling or tenderness (bilateral low back pain flank area and midline). Normal range of  motion.     Cervical back: Normal range of motion and neck supple.  Skin:    General: Skin is warm and dry.     Capillary Refill: Capillary refill takes less than 2 seconds.  Neurological:     General: No focal deficit present.     Mental Status: He is alert and oriented to person, place, and time.     Cranial Nerves: No cranial nerve deficit.     Motor: No weakness.  Psychiatric:        Mood and Affect: Mood normal.        Behavior: Behavior normal.        Thought Content: Thought content normal.        Judgment: Judgment normal.        Assessment And Plan:     1. Urinary stream splitting Comments: Will refer to Urology and check urinalysis.  - Ambulatory referral to Urology - POCT Urinalysis Dipstick (81002)  2. Rash  and nonspecific skin eruption Comments: Would like a new referral to Dermatology provider, will send to Dr. Leonie Green - Ambulatory referral to Dermatology  3. Linear lichen planus Comments: Continues with clobetasol cream  4. Pruritus Comments: Encouraged to take an over the counter antihistamine  5. Acute midline low back pain without sciatica Comments: Tenderness to low back to flank and midline spine, will treat with muscle relaxer and refer to chiropractor - Ambulatory referral to Chiropractic - cyclobenzaprine (FLEXERIL) 10 MG tablet; Take 1 tablet (10 mg total) by mouth 3 (three) times daily as needed for muscle spasms.  Dispense: 30 tablet; Refill: 0  6. Encounter for immunization - Pneumococcal conjugate vaccine 20-valent (Prevnar 20)     Patient was given opportunity to ask questions. Patient verbalized understanding of the plan and was able to repeat key elements of the plan. All questions were answered to their satisfaction.  Minette Brine, FNP   I, Minette Brine, FNP, have reviewed all documentation for this visit. The documentation on 08/13/21 for the exam, diagnosis, procedures, and orders are all accurate and complete.   IF YOU HAVE BEEN REFERRED TO A SPECIALIST, IT MAY TAKE 1-2 WEEKS TO SCHEDULE/PROCESS THE REFERRAL. IF YOU HAVE NOT HEARD FROM US/SPECIALIST IN TWO WEEKS, PLEASE GIVE [place] A CALL AT [phone].   THE PATIENT IS ENCOURAGED TO PRACTICE SOCIAL DISTANCING DUE TO THE COVID-19 PANDEMIC.

## 2021-08-13 NOTE — Patient Instructions (Addendum)
High Cholesterol °High cholesterol is a condition in which the blood has high levels of a white, waxy substance similar to fat (cholesterol). The liver makes all the cholesterol that the body needs. The human body needs small amounts of cholesterol to help build cells. A person gets extra or excess cholesterol from the food that he or she eats. °The blood carries cholesterol from the liver to the rest of the body. If you have high cholesterol, deposits (plaques) may build up on the walls of your arteries. Arteries are the blood vessels that carry blood away from your heart. These plaques make the arteries narrow and stiff. °Cholesterol plaques increase your risk for heart attack and stroke. Work with your health care provider to keep your cholesterol levels in a healthy range. °What increases the risk? °The following factors may make you more likely to develop this condition: °Eating foods that are high in animal fat (saturated fat) or cholesterol. °Being overweight. °Not getting enough exercise. °A family history of high cholesterol (familial hypercholesterolemia). °Use of tobacco products. °Having diabetes. °What are the signs or symptoms? °In most cases, high cholesterol does not usually cause any symptoms. °In severe cases, very high cholesterol levels can cause: °Fatty bumps under the skin (xanthomas). °A white or gray ring around the black center (pupil) of the eye. °How is this diagnosed? °This condition may be diagnosed based on the results of a blood test. °If you are older than 68 years of age, your health care provider may check your cholesterol levels every 4-6 years. °You may be checked more often if you have high cholesterol or other risk factors for heart disease. °The blood test for cholesterol measures: °"Bad" cholesterol, or LDL cholesterol. This is the main type of cholesterol that causes heart disease. The desired level is less than 100 mg/dL (2.59 mmol/L). °"Good" cholesterol, or HDL  cholesterol. HDL helps protect against heart disease by cleaning the arteries and carrying the LDL to the liver for processing. The desired level for HDL is 60 mg/dL (1.55 mmol/L) or higher. °Triglycerides. These are fats that your body can store or burn for energy. The desired level is less than 150 mg/dL (1.69 mmol/L). °Total cholesterol. This measures the total amount of cholesterol in your blood and includes LDL, HDL, and triglycerides. The desired level is less than 200 mg/dL (5.17 mmol/L). °How is this treated? °Treatment for high cholesterol starts with lifestyle changes, such as diet and exercise. °Diet changes. You may be asked to eat foods that have more fiber and less saturated fats or added sugar. °Lifestyle changes. These may include regular exercise, maintaining a healthy weight, and quitting use of tobacco products. °Medicines. These are given when diet and lifestyle changes have not worked. You may be prescribed a statin medicine to help lower your cholesterol levels. °Follow these instructions at home: °Eating and drinking ° °Eat a healthy, balanced diet. This diet includes: °Daily servings of a variety of fresh, frozen, or canned fruits and vegetables. °Daily servings of whole grain foods that are rich in fiber. °Foods that are low in saturated fats and trans fats. These include poultry and fish without skin, lean cuts of meat, and low-fat dairy products. °A variety of fish, especially oily fish that contain omega-3 fatty acids. Aim to eat fish at least 2 times a week. °Avoid foods and drinks that have added sugar. °Use healthy cooking methods, such as roasting, grilling, broiling, baking, poaching, steaming, and stir-frying. Do not fry your food except for stir-frying. °  If you drink alcohol: Limit how much you have to: 0-1 drink a day for women who are not pregnant. 0-2 drinks a day for men. Know how much alcohol is in a drink. In the [place] one drink equals one 12 oz bottle of beer (355 mL),  one 5 oz glass of wine (148 mL), or one 1 oz glass of hard liquor (44 mL). Lifestyle  Get regular exercise. Aim to exercise for a total of 150 minutes a week. Increase your activity level by doing activities such as gardening, walking, and taking the stairs. Do not use any products that contain nicotine or tobacco. These products include cigarettes, chewing tobacco, and vaping devices, such as e-cigarettes. If you need help quitting, ask your health care provider. General instructions Take over-the-counter and prescription medicines only as told by your health care provider. Keep all follow-up visits. This is important. Where to find more information American Heart Association: www.heart.org National Heart, Lung, and Blood Institute: https://wilson-eaton.com/ Contact a health care provider if: You have trouble achieving or maintaining a healthy diet or weight. You are starting an exercise program. You are unable to stop smoking. Get help right away if: You have chest pain. You have trouble breathing. You have discomfort or pain in your jaw, neck, back, shoulder, or arm. You have any symptoms of a stroke. "BE FAST" is an easy way to remember the main warning signs of a stroke: B - Balance. Signs are dizziness, sudden trouble walking, or loss of balance. E - Eyes. Signs are trouble seeing or a sudden change in vision. F - Face. Signs are sudden weakness or numbness of the face, or the face or eyelid drooping on one side. A - Arms. Signs are weakness or numbness in an arm. This happens suddenly and usually on one side of the body. S - Speech. Signs are sudden trouble speaking, slurred speech, or trouble understanding what people say. T - Time. Time to call emergency services. Write down what time symptoms started. You have other signs of a stroke, such as: A sudden, severe headache with no known cause. Nausea or vomiting. Seizure. These symptoms may represent a serious problem that is an  emergency. Do not wait to see if the symptoms will go away. Get medical help right away. Call your local emergency services (911 in the [place] Do not drive yourself to the hospital. Summary Cholesterol plaques increase your risk for heart attack and stroke. Work with your health care provider to keep your cholesterol levels in a healthy range. Eat a healthy, balanced diet, get regular exercise, and maintain a healthy weight. Do not use any products that contain nicotine or tobacco. These products include cigarettes, chewing tobacco, and vaping devices, such as e-cigarettes. Get help right away if you have any symptoms of a stroke. This information is not intended to replace advice given to you by your health care provider. Make sure you discuss any questions you have with your health care provider. Document Revised: 08/21/2020 Document Reviewed: 08/11/2020 Elsevier Patient Education  [employer].   Pneumococcal Conjugate Vaccine (Prevnar 20) Suspension for Injection What is this medication? PNEUMOCOCCAL VACCINE (NEU mo KOK al vak SEEN) is a vaccine. It prevents pneumococcus bacterial infections. These bacteria can cause serious infections like pneumonia, meningitis, and blood infections. This vaccine will not treat an infection and will not cause infection. This vaccine is recommended for adults 18 years and older. This medicine may be used for other purposes; ask your health care  provider or pharmacist if you have questions. COMMON BRAND NAME(S): Prevnar 20 What should I tell my care team before I take this medication? They need to know if you have any of these conditions: bleeding disorder fever immune system problems an unusual or allergic reaction to pneumococcal vaccine, diphtheria toxoid, other vaccines, other medicines, foods, dyes, or preservatives pregnant or trying to get pregnant breast-feeding How should I use this medication? This vaccine is injected into a muscle. It is  given by a health care provider. A copy of Vaccine Information Statements will be given before each vaccination. Be sure to read this information carefully each time. This sheet may change often. Talk to your health care provider about the use of this medicine in children. Special care may be needed. Overdosage: If you think you have taken too much of this medicine contact a poison control center or emergency room at once. NOTE: This medicine is only for you. Do not share this medicine with others. What if I miss a dose? This does not apply. This medicine is not for regular use. What may interact with this medication? medicines for cancer chemotherapy medicines that suppress your immune function steroid medicines like prednisone or cortisone This list may not describe all possible interactions. Give your health care provider a list of all the medicines, herbs, non-prescription drugs, or dietary supplements you use. Also tell them if you smoke, drink alcohol, or use illegal drugs. Some items may interact with your medicine. What should I watch for while using this medication? Mild fever and pain should go away in 3 days or less. Report any unusual symptoms to your health care provider. What side effects may I notice from receiving this medication? Side effects that you should report to your doctor or health care professional as soon as possible: allergic reactions (skin rash, itching or hives; swelling of the face, lips, or tongue) confusion fast, irregular heartbeat fever over 102 degrees F muscle weakness seizures trouble breathing unusual bruising or bleeding Side effects that usually do not require medical attention (report to your doctor or health care professional if they continue or are bothersome): fever of 102 degrees F or less headache joint pain muscle cramps, pain pain, tender at site where injected This list may not describe all possible side effects. Call your doctor for  medical advice about side effects. You may report side effects to FDA at 1-800-FDA-1088. Where should I keep my medication? This vaccine is only given by a health care provider. It will not be stored at home. NOTE: This sheet is a summary. It may not cover all possible information. If you have questions about this medicine, talk to your doctor, pharmacist, or health care provider.  2022 Elsevier/Gold Standard (2020-02-21 00:00:00)

## 2021-08-13 NOTE — Patient Instructions (Signed)
Robert Donaldson , Thank you for taking time to come for your Medicare Wellness Visit. I appreciate your ongoing commitment to your health goals. Please review the following plan we discussed and let me know if I can assist you in the future.   Screening recommendations/referrals: Colonoscopy: completed 07/22/2014, due 07/22/2024 Recommended yearly ophthalmology/optometry visit for glaucoma screening and checkup Recommended yearly dental visit for hygiene and checkup  Vaccinations: Influenza vaccine: decline Pneumococcal vaccine: declines at this time Tdap vaccine: completed 04/06/2015, due 04/05/2025 Shingles vaccine: completed   Covid-19:  03/31/2021, 04/14/2020, 08/06/2019, 07/16/2019  Advanced directives: Advance directive discussed with you today. Even though you declined this today please call our office should you change your mind and we can give you the proper paperwork for you to fill out.  Conditions/risks identified: none  Next appointment: Follow up in one year for your annual wellness visit.   Preventive Care 23 Years and Older, Male Preventive care refers to lifestyle choices and visits with your health care provider that can promote health and wellness. What does preventive care include? A yearly physical exam. This is also called an annual well check. Dental exams once or twice a year. Routine eye exams. Ask your health care provider how often you should have your eyes checked. Personal lifestyle choices, including: Daily care of your teeth and gums. Regular physical activity. Eating a healthy diet. Avoiding tobacco and drug use. Limiting alcohol use. Practicing safe sex. Taking low doses of aspirin every day. Taking vitamin and mineral supplements as recommended by your health care provider. What happens during an annual well check? The services and screenings done by your health care provider during your annual well check will depend on your age, overall health, lifestyle  risk factors, and family history of disease. Counseling  Your health care provider may ask you questions about your: Alcohol use. Tobacco use. Drug use. Emotional well-being. Home and relationship well-being. Sexual activity. Eating habits. History of falls. Memory and ability to understand (cognition). Work and work Statistician. Screening  You may have the following tests or measurements: Height, weight, and BMI. Blood pressure. Lipid and cholesterol levels. These may be checked every 5 years, or more frequently if you are over 36 years old. Skin check. Lung cancer screening. You may have this screening every year starting at age 23 if you have a 30-pack-year history of smoking and currently smoke or have quit within the past 15 years. Fecal occult blood test (FOBT) of the stool. You may have this test every year starting at age 65. Flexible sigmoidoscopy or colonoscopy. You may have a sigmoidoscopy every 5 years or a colonoscopy every 10 years starting at age 29. Prostate cancer screening. Recommendations will vary depending on your family history and other risks. Hepatitis C blood test. Hepatitis B blood test. Sexually transmitted disease (STD) testing. Diabetes screening. This is done by checking your blood sugar (glucose) after you have not eaten for a while (fasting). You may have this done every 1-3 years. Abdominal aortic aneurysm (AAA) screening. You may need this if you are a current or former smoker. Osteoporosis. You may be screened starting at age 56 if you are at high risk. Talk with your health care provider about your test results, treatment options, and if necessary, the need for more tests. Vaccines  Your health care provider may recommend certain vaccines, such as: Influenza vaccine. This is recommended every year. Tetanus, diphtheria, and acellular pertussis (Tdap, Td) vaccine. You may need a Td booster every  10 years. Zoster vaccine. You may need this after age  54. Pneumococcal 13-valent conjugate (PCV13) vaccine. One dose is recommended after age 93. Pneumococcal polysaccharide (PPSV23) vaccine. One dose is recommended after age 38. Talk to your health care provider about which screenings and vaccines you need and how often you need them. This information is not intended to replace advice given to you by your health care provider. Make sure you discuss any questions you have with your health care provider. Document Released: 07/04/2015 Document Revised: 02/25/2016 Document Reviewed: 04/08/2015 Elsevier Interactive Patient Education  2017 [employer] Prevention in the Home Falls can cause injuries. They can happen to people of all ages. There are many things you can do to make your home safe and to help prevent falls. What can I do on the outside of my home? Regularly fix the edges of walkways and driveways and fix any cracks. Remove anything that might make you trip as you walk through a door, such as a raised step or threshold. Trim any bushes or trees on the path to your home. Use bright outdoor lighting. Clear any walking paths of anything that might make someone trip, such as rocks or tools. Regularly check to see if handrails are loose or broken. Make sure that both sides of any steps have handrails. Any raised decks and porches should have guardrails on the edges. Have any leaves, snow, or ice cleared regularly. Use sand or salt on walking paths during winter. Clean up any spills in your garage right away. This includes oil or grease spills. What can I do in the bathroom? Use night lights. Install grab bars by the toilet and in the tub and shower. Do not use towel bars as grab bars. Use non-skid mats or decals in the tub or shower. If you need to sit down in the shower, use a plastic, non-slip stool. Keep the floor dry. Clean up any water that spills on the floor as soon as it happens. Remove soap buildup in the tub or shower  regularly. Attach bath mats securely with double-sided non-slip rug tape. Do not have throw rugs and other things on the floor that can make you trip. What can I do in the bedroom? Use night lights. Make sure that you have a light by your bed that is easy to reach. Do not use any sheets or blankets that are too big for your bed. They should not hang down onto the floor. Have a firm chair that has side arms. You can use this for support while you get dressed. Do not have throw rugs and other things on the floor that can make you trip. What can I do in the kitchen? Clean up any spills right away. Avoid walking on wet floors. Keep items that you use a lot in easy-to-reach places. If you need to reach something above you, use a strong step stool that has a grab bar. Keep electrical cords out of the way. Do not use floor polish or wax that makes floors slippery. If you must use wax, use non-skid floor wax. Do not have throw rugs and other things on the floor that can make you trip. What can I do with my stairs? Do not leave any items on the stairs. Make sure that there are handrails on both sides of the stairs and use them. Fix handrails that are broken or loose. Make sure that handrails are as long as the stairways. Check any carpeting to make  sure that it is firmly attached to the stairs. Fix any carpet that is loose or worn. Avoid having throw rugs at the top or bottom of the stairs. If you do have throw rugs, attach them to the floor with carpet tape. Make sure that you have a light switch at the top of the stairs and the bottom of the stairs. If you do not have them, ask someone to add them for you. What else can I do to help prevent falls? Wear shoes that: Do not have high heels. Have rubber bottoms. Are comfortable and fit you well. Are closed at the toe. Do not wear sandals. If you use a stepladder: Make sure that it is fully opened. Do not climb a closed stepladder. Make sure that  both sides of the stepladder are locked into place. Ask someone to hold it for you, if possible. Clearly mark and make sure that you can see: Any grab bars or handrails. First and last steps. Where the edge of each step is. Use tools that help you move around (mobility aids) if they are needed. These include: Canes. Walkers. Scooters. Crutches. Turn on the lights when you go into a dark area. Replace any light bulbs as soon as they burn out. Set up your furniture so you have a clear path. Avoid moving your furniture around. If any of your floors are uneven, fix them. If there are any pets around you, be aware of where they are. Review your medicines with your doctor. Some medicines can make you feel dizzy. This can increase your chance of falling. Ask your doctor what other things that you can do to help prevent falls. This information is not intended to replace advice given to you by your health care provider. Make sure you discuss any questions you have with your health care provider. Document Released: 04/03/2009 Document Revised: 11/13/2015 Document Reviewed: 07/12/2014 Elsevier Interactive Patient Education  2017 [employer].

## 2021-08-13 NOTE — Progress Notes (Signed)
This visit occurred during the SARS-CoV-2 public health emergency.  Safety protocols were in place, including screening questions prior to the visit, additional usage of staff PPE, and extensive cleaning of exam room while observing appropriate contact time as indicated for disinfecting solutions.  Subjective:   Robert Donaldson is a 66 y.o. male who presents for Medicare Annual/Subsequent preventive examination.  Review of Systems     Cardiac Risk Factors include: advanced age (>45men, >34 women);dyslipidemia;male gender     Objective:    Today's Vitals   08/13/21 0853 08/13/21 0900  BP: 120/64   Pulse: (!) 59   Temp: 98 F (36.7 C)   TempSrc: Oral   SpO2: 96%   Weight: 184 lb 9.6 oz (83.7 kg)   Height: 5\' 8"  (1.727 m)   PainSc:  7    Body mass index is 28.07 kg/m.  Advanced Directives 08/13/2021 08/06/2020 08/01/2019 07/26/2018 07/26/2018 04/06/2015  Does Patient Have a Medical Advance Directive? No Yes Yes - No No  Type of Advance Directive - [place];Living will [place];Living will - - -  Copy of [place] in Chart? - No - copy requested No - copy requested - - -  Would patient like information on creating a medical advance directive? No - Patient declined - - Yes (MAU/Ambulatory/Procedural Areas - Information given) Yes (MAU/Ambulatory/Procedural Areas - Information given) No - patient declined information    Current Medications (verified) Outpatient Encounter Medications as of 08/13/2021  Medication Sig   celecoxib (CELEBREX) 200 MG capsule Take 200 mg by mouth daily. Takes as needed   Clobetasol Prop Emollient Base 0.05 % emollient cream Apply topically 2 (two) times a day if needed (Rash).   meloxicam (MOBIC) 7.5 MG tablet Take by mouth. As needed   Nutritional Supplements (VITAMIN D BOOSTER PO) Take 1 capsule by mouth daily.   rosuvastatin (CRESTOR) 5 MG tablet TAKE 1 TABLET DAILY   triamcinolone (KENALOG) 0.025 %  ointment Apply 1 application topically 2 (two) times daily.   vitamin E 200 UNIT capsule Take 5,000 Units by mouth daily.    No facility-administered encounter medications on file as of 08/13/2021.    Allergies (verified) Patient has no known allergies.   History: Past Medical History:  Diagnosis Date   HLD (hyperlipidemia)    Vitamin D deficiency    Past Surgical History:  Procedure Laterality Date   KNEE SURGERY     Family History  Problem Relation Age of Onset   Hyperlipidemia Sister    Social History   Socioeconomic History   Marital status: Married    Spouse name: Not on file   Number of children: Not on file   Years of education: Not on file   Highest education level: Not on file  Occupational History   Occupation: retired  Tobacco Use   Smoking status: Former    Types: Cigarettes   Smokeless tobacco: Never  Scientific laboratory technician Use: Never used  Substance and Sexual Activity   Alcohol use: Not Currently    Comment: occassionally   Drug use: No   Sexual activity: Yes  Other Topics Concern   Not on file  Social History Narrative   Not on file   Social Determinants of Health   Financial Resource Strain: Low Risk    Difficulty of Paying Living Expenses: Not hard at all  Food Insecurity: No Food Insecurity   Worried About Charity fundraiser in the Last Year:  Never true   Ran Out of Food in the Last Year: Never true  Transportation Needs: No Transportation Needs   Lack of Transportation (Medical): No   Lack of Transportation (Non-Medical): No  Physical Activity: Sufficiently Active   Days of Exercise per Week: 4 days   Minutes of Exercise per Session: 120 min  Stress: No Stress Concern Present   Feeling of Stress : Not at all  Social Connections: Not on file    Tobacco Counseling Counseling given: Not Answered   Clinical Intake:  Pre-visit preparation completed: Yes  Pain : 0-10 Pain Score: 7  Pain Type: Chronic pain Pain Location:  Back Pain Orientation: Lower Pain Radiating Towards: down back of legs Pain Descriptors / Indicators: Dull, Constant Pain Onset: More than a month ago Pain Frequency: Constant     Nutritional Status: BMI 25 -29 Overweight Nutritional Risks: None Diabetes: No  How often do you need to have someone help you when you read instructions, pamphlets, or other written materials from your doctor or pharmacy?: 1 - Never  Diabetic? no  Interpreter Needed?: No  Information entered by :: NAllen LPN   Activities of Daily Living In your present state of health, do you have any difficulty performing the following activities: 08/13/2021  Hearing? N  Vision? N  Difficulty concentrating or making decisions? N  Walking or climbing stairs? N  Dressing or bathing? N  Doing errands, shopping? N  Preparing Food and eating ? N  Using the Toilet? N  In the past six months, have you accidently leaked urine? N  Do you have problems with loss of bowel control? N  Managing your Medications? N  Managing your Finances? N  Housekeeping or managing your Housekeeping? N  Some recent data might be hidden    Patient Care Team: Minette Brine, FNP as PCP - General (General Practice) Lavonna Monarch, MD as Consulting Physician (Dermatology)  Indicate any recent Medical Services you may have received from other than [facility] providers in the past year (date may be approximate).     Assessment:   This is a routine wellness examination for [facility].  Hearing/Vision screen Vision Screening - Comments:: Regular eye exams, [place]  Dietary issues and exercise activities discussed: Current Exercise Habits: Home exercise routine, Type of exercise: strength training/weights;Other - see comments (elliptical machine), Time (Minutes): > 60, Frequency (Times/Week): 4, Weekly Exercise (Minutes/Week): 0   Goals Addressed             This Visit's Progress    Patient Stated       08/13/2021, lose belly fat        Depression Screen PHQ 2/9 Scores 08/13/2021 08/06/2020 08/01/2019 05/22/2019 01/24/2019 12/04/2018 09/12/2018  PHQ - 2 Score 0 0 0 0 0 0 0  PHQ- 9 Score - - 0 - - - -    Fall Risk Fall Risk  08/13/2021 08/06/2020 08/01/2019 05/22/2019 01/24/2019  Falls in the past year? 0 0 0 0 0  Risk for fall due to : Impaired balance/gait No Fall Risks - - -  Follow up Falls evaluation completed;Education provided;Falls prevention discussed Falls evaluation completed;Education provided;Falls prevention discussed Falls evaluation completed;Education provided;Falls prevention discussed - -    FALL RISK PREVENTION PERTAINING TO THE HOME:  Any stairs in or around the home? Yes  If so, are there any without handrails? No  Home free of loose throw rugs in walkways, pet beds, electrical cords, etc? Yes  Adequate lighting in your home to  reduce risk of falls? Yes   ASSISTIVE DEVICES UTILIZED TO PREVENT FALLS:  Life alert? No  Use of a cane, walker or w/c? No  Grab bars in the bathroom? Yes  Shower chair or bench in shower? No  Elevated toilet seat or a handicapped toilet? No   TIMED UP AND GO:  Was the test performed? No .      Cognitive Function: MMSE - Mini Mental State Exam 07/26/2018  Orientation to time 5  Orientation to Place 5  Registration 3  Attention/ Calculation 5  Recall 3  Language- name 2 objects 2  Language- repeat 1  Language- follow 3 step command 3  Language- read & follow direction 1  Write a sentence 1     6CIT Screen 08/06/2020 08/01/2019  What Year? 0 points 0 points  What month? 0 points 0 points  What time? 0 points 0 points  Count back from 20 0 points 0 points  Months in reverse 0 points 0 points  Repeat phrase 0 points 0 points  Total Score 0 0    Immunizations Immunization History  Administered Date(s) Administered   Hepatitis A, Adult 05/03/1995, 05/13/1996   Influenza Whole 05/03/1995, 05/13/1996, 03/29/1997   Influenza, High Dose Seasonal PF 05/22/2019    Influenza-Unspecified 04/01/2009, 06/05/2010, 05/03/2012, 05/03/2013, 05/03/2014   OPV 04/21/1978   PFIZER(Purple Top)SARS-COV-2 Vaccination 07/16/2019, 08/06/2019, 04/14/2020   Pfizer Covid-19 Vaccine Bivalent Booster 72yrs & up 03/31/2021   Pneumococcal Polysaccharide-23 07/26/2018   Smallpox 01/20/1980   Td 11/19/1988   Tdap 04/01/2009, 04/06/2015   Typhoid Parenteral, AKD ([place] [employer]) 07/22/1988   Yellow Fever 01/19/1977   Zoster Recombinat (Shingrix) 12/20/2019, 03/04/2020    TDAP status: Up to date  Flu Vaccine status: Declined, Education has been provided regarding the importance of this vaccine but patient still declined. Advised may receive this vaccine at local pharmacy or Health Dept. Aware to provide a copy of the vaccination record if obtained from local pharmacy or Health Dept. Verbalized acceptance and understanding.  Pneumococcal vaccine status: Due, Education has been provided regarding the importance of this vaccine. Advised may receive this vaccine at local pharmacy or Health Dept. Aware to provide a copy of the vaccination record if obtained from local pharmacy or Health Dept. Verbalized acceptance and understanding.  Covid-19 vaccine status: Completed vaccines  Qualifies for Shingles Vaccine? Yes   Zostavax completed No   Shingrix Completed?: Yes  Screening Tests Health Maintenance  Topic Date Due   Pneumonia Vaccine 61+ Years old (2 - PCV) 07/27/2019   INFLUENZA VACCINE  09/18/2021 (Originally 01/19/2021)   COLONOSCOPY (Pts 45-12yrs Insurance coverage will need to be confirmed)  07/22/2024   TETANUS/TDAP  04/05/2025   COVID-19 Vaccine  Completed   Hepatitis C Screening  Completed   Zoster Vaccines- Shingrix  Completed   HPV VACCINES  Aged Out    Health Maintenance  Health Maintenance Due  Topic Date Due   Pneumonia Vaccine 47+ Years old (2 - PCV) 07/27/2019    Colorectal cancer screening: Type of screening: Colonoscopy. Completed 04/06/2015. Repeat  every 10 years  Lung Cancer Screening: (Low Dose CT Chest recommended if Age 36-80 years, 30 pack-year currently smoking OR have quit w/in 15years.) does not qualify.   Lung Cancer Screening Referral: no  Additional Screening:  Hepatitis C Screening: does qualify; Completed 12/24/2020  Vision Screening: Recommended annual ophthalmology exams for early detection of glaucoma and other disorders of the eye. Is the patient up to date with their annual eye  exam?  Yes  Who is the provider or what is the name of the office in which the patient attends annual eye exams? [facility] If pt is not established with a provider, would they like to be referred to a provider to establish care? No .   Dental Screening: Recommended annual dental exams for proper oral hygiene  Community Resource Referral / Chronic Care Management: CRR required this visit?  No   CCM required this visit?  No      Plan:     I have personally reviewed and noted the following in the patients chart:   Medical and social history Use of alcohol, tobacco or illicit drugs  Current medications and supplements including opioid prescriptions. Patient is not currently taking opioid prescriptions. Functional ability and status Nutritional status Physical activity Advanced directives List of other physicians Hospitalizations, surgeries, and ER visits in previous 12 months Vitals Screenings to include cognitive, depression, and falls Referrals and appointments  In addition, I have reviewed and discussed with patient certain preventive protocols, quality metrics, and best practice recommendations. A written personalized care plan for preventive services as well as general preventive health recommendations were provided to patient.     Kellie Simmering, LPN   9/79/[id number]   Nurse Notes: 6 CIT not administered. Patient has normal cognition per direct observation.

## 2021-08-31 ENCOUNTER — Encounter: Payer: Self-pay | Admitting: Nurse Practitioner

## 2021-09-04 ENCOUNTER — Encounter: Payer: Self-pay | Admitting: Nurse Practitioner

## 2021-10-07 ENCOUNTER — Other Ambulatory Visit: Payer: Medicare PPO

## 2021-10-07 ENCOUNTER — Encounter: Payer: Self-pay | Admitting: Nurse Practitioner

## 2021-10-07 ENCOUNTER — Other Ambulatory Visit: Payer: Self-pay | Admitting: Nurse Practitioner

## 2021-10-07 ENCOUNTER — Other Ambulatory Visit: Payer: Self-pay

## 2021-10-07 ENCOUNTER — Ambulatory Visit (INDEPENDENT_AMBULATORY_CARE_PROVIDER_SITE_OTHER): Payer: Medicare PPO | Admitting: Nurse Practitioner

## 2021-10-07 VITALS — BP 118/68 | HR 62 | Temp 98.4°F | Ht 68.0 in | Wt 184.0 lb

## 2021-10-07 DIAGNOSIS — Z0000 Encounter for general adult medical examination without abnormal findings: Secondary | ICD-10-CM | POA: Diagnosis not present

## 2021-10-07 DIAGNOSIS — Z125 Encounter for screening for malignant neoplasm of prostate: Secondary | ICD-10-CM

## 2021-10-07 DIAGNOSIS — R7309 Other abnormal glucose: Secondary | ICD-10-CM | POA: Diagnosis not present

## 2021-10-07 DIAGNOSIS — E782 Mixed hyperlipidemia: Secondary | ICD-10-CM

## 2021-10-07 DIAGNOSIS — R3911 Hesitancy of micturition: Secondary | ICD-10-CM

## 2021-10-07 MED ORDER — ACYCLOVIR 5 % EX OINT
1.0000 | TOPICAL_OINTMENT | CUTANEOUS | 1 refills | Status: DC
Start: 2021-10-07 — End: 2021-11-18

## 2021-10-07 NOTE — Progress Notes (Signed)
?Industrial/product designer as a Education administrator for [employer], FNP.,have documented all relevant documentation on the behalf of Minette Brine, FNP,as directed by  Minette Brine, FNP while in the presence of Minette Brine, [place]. ? ?This visit occurred during the SARS-CoV-2 public health emergency.  Safety protocols were in place, including screening questions prior to the visit, additional usage of staff PPE, and extensive cleaning of exam room while observing appropriate contact time as indicated for disinfecting solutions. ? ?Subjective:  ?  ? Patient ID: Robert Donaldson , male    DOB: 01-23-1956 , 66 y.o.   MRN: [id number] ? ? ?Chief Complaint  ?Patient presents with  ? Annual Exam  ? ? ?HPI ? ?Patient presents today for HM.  ? ?Wt Readings from Last 3 Encounters: ?10/07/21 : 184 lb (83.5 kg) ?08/13/21 : 184 lb (83.5 kg) ?08/13/21 : 184 lb 9.6 oz (83.7 kg) ? ? ?  ? ?Past Medical History:  ?Diagnosis Date  ? HLD (hyperlipidemia)   ? Vitamin D deficiency   ?  ? ?Family History  ?Problem Relation Age of Onset  ? Hyperlipidemia Sister   ? ? ? ?Current Outpatient Medications:  ?  celecoxib (CELEBREX) 200 MG capsule, Take 200 mg by mouth daily. Takes as needed, Disp: , Rfl:  ?  Clobetasol Prop Emollient Base 0.05 % emollient cream, Apply topically 2 (two) times a day if needed (Rash)., Disp: 45 g, Rfl: 4 ?  cyclobenzaprine (FLEXERIL) 10 MG tablet, Take 1 tablet (10 mg total) by mouth 3 (three) times daily as needed for muscle spasms., Disp: 30 tablet, Rfl: 0 ?  diclofenac Sodium (VOLTAREN) 1 % GEL, Apply 2 g topically 4 (four) times daily., Disp: 100 g, Rfl: 2 ?  meloxicam (MOBIC) 7.5 MG tablet, Take by mouth. As needed, Disp: , Rfl:  ?  Nutritional Supplements (VITAMIN D BOOSTER PO), Take 1 capsule by mouth daily., Disp: , Rfl:  ?  triamcinolone (KENALOG) 0.025 % ointment, Apply 1 application topically 2 (two) times daily., Disp: 30 g, Rfl: 0 ?  vitamin E 200 UNIT capsule, Take 5,000 Units by mouth daily. , Disp: , Rfl:  ?   acyclovir ointment (ZOVIRAX) 5 %, Apply 1 application. topically every 3 (three) hours., Disp: 30 g, Rfl: 1 ?  rosuvastatin (CRESTOR) 5 MG tablet, TAKE 1 TABLET DAILY, Disp: 90 tablet, Rfl: 3  ? ?No Known Allergies  ? ?Men's preventive visit. Patient Health Questionnaire (PHQ-2) is  ?Flowsheet Row Clinical Support from 08/13/2021 in [facility]  ?PHQ-2 Total Score 0  ? ?  ? ?Patient is on a Regular diet. Exercising regularly - did not go last week. Marital status: Married. Relevant history for alcohol use is:  ?Social History  ? ?Substance and Sexual Activity  ?Alcohol Use Not Currently  ? Comment: occassionally  ? ?Relevant history for tobacco use is:  ?Social History  ? ?Tobacco Use  ?Smoking Status Former  ? Types: Cigarettes  ?Smokeless Tobacco Never  ? ? ?Review of Systems  ?Constitutional: Negative.   ?HENT: Negative.    ?Eyes: Negative.   ?Respiratory: Negative.    ?Cardiovascular: Negative.   ?Gastrointestinal: Negative.   ?Endocrine: Negative.   ?Genitourinary: Negative.   ?Musculoskeletal: Negative.   ?Skin: Negative.   ?Allergic/Immunologic: Negative.   ?Neurological: Negative.   ?Hematological: Negative.   ?Psychiatric/Behavioral: Negative.     ? ?Today's Vitals  ? 10/07/21 0905  ?BP: 118/68  ?Pulse: 62  ?Temp: 98.4 ?F (36.9 ?C)  ?TempSrc: Oral  ?Weight: 184  lb (83.5 kg)  ?Height: 5' 8"  (1.727 m)  ? ?Body mass index is 27.98 kg/m?.  ?Wt Readings from Last 3 Encounters:  ?10/07/21 184 lb (83.5 kg)  ?08/13/21 184 lb (83.5 kg)  ?08/13/21 184 lb 9.6 oz (83.7 kg)  ? ? ?Objective:  ?Physical Exam ?Vitals reviewed.  ?Constitutional:   ?   General: He is not in acute distress. ?   Appearance: Normal appearance. He is obese.  ?HENT:  ?   Head: Normocephalic and atraumatic.  ?   Right Ear: Tympanic membrane, ear canal and external ear normal. There is no impacted cerumen.  ?   Left Ear: Tympanic membrane, ear canal and external ear normal. There is no impacted cerumen.  ?Cardiovascular:  ?    Rate and Rhythm: Normal rate and regular rhythm.  ?   Pulses: Normal pulses.  ?   Heart sounds: Normal heart sounds. No murmur heard. ?Pulmonary:  ?   Effort: Pulmonary effort is normal. No respiratory distress.  ?   Breath sounds: Normal breath sounds. No wheezing.  ?Abdominal:  ?   General: Abdomen is flat. Bowel sounds are normal. There is no distension.  ?   Palpations: Abdomen is soft.  ?Genitourinary: ?   Comments: Deferred - has a referral to Urology ?Musculoskeletal:     ?   General: Normal range of motion.  ?   Cervical back: Normal range of motion and neck supple.  ?Skin: ?   General: Skin is warm.  ?   Capillary Refill: Capillary refill takes less than 2 seconds.  ?Neurological:  ?   General: No focal deficit present.  ?   Mental Status: He is alert and oriented to person, place, and time.  ?Psychiatric:     ?   Mood and Affect: Mood normal.     ?   Behavior: Behavior normal.     ?   Thought Content: Thought content normal.     ?   Judgment: Judgment normal.  ?  ? ?   ?Assessment And Plan:  ?  ?1. Encounter for annual physical exam ?Behavior modifications discussed and diet history reviewed.   ?Pt will continue to exercise regularly and modify diet with low GI, plant based foods and decrease intake of processed foods.  ?Recommend intake of daily multivitamin, Vitamin D, and calcium.  ?Recommend colonoscopy  (up to date) for preventive screenings, as well as recommend immunizations that include influenza, TDAP, and Shingles ?- CBC ? ?2. Encounter for prostate cancer screening ?- PSA ? ?3. Mixed hyperlipidemia ?Comments: Stable, continue with statin, tolerating well. Continue to limit intake of fried and fatty foods.  ?- CMP14+EGFR ?- Lipid panel ? ?4. Abnormal glucose ?Comments: HgbA1c is stable, continue to eat a diet low in sugary foods and drinks.  ?- Hemoglobin A1c ? ?5. Urinary hesitancy ?Comments: Referral has been made to urology he is to call today for an appt. ?- PSA ? ? ? ?Patient was given  opportunity to ask questions. Patient verbalized understanding of the plan and was able to repeat key elements of the plan. All questions were answered to their satisfaction.  ? ?Minette Brine, FNP  ? ?I, Minette Brine, FNP, have reviewed all documentation for this visit. The documentation on 10/07/21 for the exam, diagnosis, procedures, and orders are all accurate and complete.  ? ?THE PATIENT IS ENCOURAGED TO PRACTICE SOCIAL DISTANCING DUE TO THE COVID-19 PANDEMIC.   ?

## 2021-10-07 NOTE — Patient Instructions (Addendum)
Health Maintenance, Male ?Adopting a healthy lifestyle and getting preventive care are important in promoting health and wellness. Ask your health care provider about: ?The right schedule for you to have regular tests and exams. ?Things you can do on your own to prevent diseases and keep yourself healthy. ?What should I know about diet, weight, and exercise? ?Eat a healthy diet ? ?Eat a diet that includes plenty of vegetables, fruits, low-fat dairy products, and lean protein. ?Do not eat a lot of foods that are high in solid fats, added sugars, or sodium. ?Maintain a healthy weight ?Body mass index (BMI) is a measurement that can be used to identify possible weight problems. It estimates body fat based on height and weight. Your health care provider can help determine your BMI and help you achieve or maintain a healthy weight. ?Get regular exercise ?Get regular exercise. This is one of the most important things you can do for your health. Most adults should: ?Exercise for at least 150 minutes each week. The exercise should increase your heart rate and make you sweat (moderate-intensity exercise). ?Do strengthening exercises at least twice a week. This is in addition to the moderate-intensity exercise. ?Spend less time sitting. Even light physical activity can be beneficial. ?Watch cholesterol and blood lipids ?Have your blood tested for lipids and cholesterol at 68 years of age, then have this test every 5 years. ?You may need to have your cholesterol levels checked more often if: ?Your lipid or cholesterol levels are high. ?You are older than 68 years of age. ?You are at high risk for heart disease. ?What should I know about cancer screening? ?Many types of cancers can be detected early and may often be prevented. Depending on your health history and family history, you may need to have cancer screening at various ages. This may include screening for: ?Colorectal cancer. ?Prostate cancer. ?Skin cancer. ?Lung  cancer. ?What should I know about heart disease, diabetes, and high blood pressure? ?Blood pressure and heart disease ?High blood pressure causes heart disease and increases the risk of stroke. This is more likely to develop in people who have high blood pressure readings or are overweight. ?Talk with your health care provider about your target blood pressure readings. ?Have your blood pressure checked: ?Every 3-5 years if you are 42-43 years of age. ?Every year if you are 47 years old or older. ?If you are between the ages of 11 and 57 and are a current or former smoker, ask your health care provider if you should have a one-time screening for abdominal aortic aneurysm (AAA). ?Diabetes ?Have regular diabetes screenings. This checks your fasting blood sugar level. Have the screening done: ?Once every three years after age 8 if you are at a normal weight and have a low risk for diabetes. ?More often and at a younger age if you are overweight or have a high risk for diabetes. ?What should I know about preventing infection? ?Hepatitis B ?If you have a higher risk for hepatitis B, you should be screened for this virus. Talk with your health care provider to find out if you are at risk for hepatitis B infection. ?Hepatitis C ?Blood testing is recommended for: ?Everyone born from [birth year] through 1967. ?Anyone with known risk factors for hepatitis C. ?Sexually transmitted infections (STIs) ?You should be screened each year for STIs, including gonorrhea and chlamydia, if: ?You are sexually active and are younger than 68 years of age. ?You are older than 68 years of age and your  health care provider tells you that you are at risk for this type of infection. ?Your sexual activity has changed since you were last screened, and you are at increased risk for chlamydia or gonorrhea. Ask your health care provider if you are at risk. ?Ask your health care provider about whether you are at high risk for HIV. Your health care provider  may recommend a prescription medicine to help prevent HIV infection. If you choose to take medicine to prevent HIV, you should first get tested for HIV. You should then be tested every 3 months for as long as you are taking the medicine. ?Follow these instructions at home: ?Alcohol use ?Do not drink alcohol if your health care provider tells you not to drink. ?If you drink alcohol: ?Limit how much you have to 0-2 drinks a day. ?Know how much alcohol is in your drink. In the [place] one drink equals one 12 oz bottle of beer (355 mL), one 5 oz glass of wine (148 mL), or one 1? oz glass of hard liquor (44 mL). ?Lifestyle ?Do not use any products that contain nicotine or tobacco. These products include cigarettes, chewing tobacco, and vaping devices, such as e-cigarettes. If you need help quitting, ask your health care provider. ?Do not use street drugs. ?Do not share needles. ?Ask your health care provider for help if you need support or information about quitting drugs. ?General instructions ?Schedule regular health, dental, and eye exams. ?Stay current with your vaccines. ?Tell your health care provider if: ?You often feel depressed. ?You have ever been abused or do not feel safe at home. ?Summary ?Adopting a healthy lifestyle and getting preventive care are important in promoting health and wellness. ?Follow your health care provider's instructions about healthy diet, exercising, and getting tested or screened for diseases. ?Follow your health care provider's instructions on monitoring your cholesterol and blood pressure. ?This information is not intended to replace advice given to you by your health care provider. Make sure you discuss any questions you have with your health care provider. ?Document Revised: 10/27/2020 Document Reviewed: 10/27/2020 ?Elsevier Patient Education ? [place]. ? ? ?DR RODULFO   [phone] - Chiropractor ? ?[facility]  [phone] ? ?You can get over the counter debrox drops for  your ears ?

## 2021-10-08 LAB — CBC
Hematocrit: 41.9 % (ref 37.5–51.0)
Hemoglobin: 14.4 g/dL (ref 13.0–17.7)
MCH: 30.6 pg (ref 26.6–33.0)
MCHC: 34.4 g/dL (ref 31.5–35.7)
MCV: 89 fL (ref 79–97)
Platelets: 231 10*3/uL (ref 150–450)
RBC: 4.71 x10E6/uL (ref 4.14–5.80)
RDW: 12.6 % (ref 11.6–15.4)
WBC: 6 10*3/uL (ref 3.4–10.8)

## 2021-10-08 LAB — LIPID PANEL
Chol/HDL Ratio: 4.1 ratio (ref 0.0–5.0)
Cholesterol, Total: 235 mg/dL — ABNORMAL HIGH (ref 100–199)
HDL: 57 mg/dL (ref >39)
LDL Chol Calc (NIH): 154 mg/dL — ABNORMAL HIGH (ref 0–99)
Triglycerides: 134 mg/dL (ref 0–149)
VLDL Cholesterol Cal: 24 mg/dL (ref 5–40)

## 2021-10-08 LAB — PSA: Prostate Specific Ag, Serum: 0.8 ng/mL (ref 0.0–4.0)

## 2021-10-08 LAB — CMP14+EGFR
ALT: 24 IU/L (ref 0–44)
AST: 26 IU/L (ref 0–40)
Albumin/Globulin Ratio: 2 (ref 1.2–2.2)
Albumin: 4.5 g/dL (ref 3.8–4.8)
Alkaline Phosphatase: 65 IU/L (ref 44–121)
BUN/Creatinine Ratio: 11 (ref 10–24)
BUN: 13 mg/dL (ref 8–27)
Bilirubin Total: 0.5 mg/dL (ref 0.0–1.2)
CO2: 21 mmol/L (ref 20–29)
Calcium: 9.3 mg/dL (ref 8.6–10.2)
Chloride: 104 mmol/L (ref 96–106)
Creatinine, Ser: 1.22 mg/dL (ref 0.76–1.27)
Globulin, Total: 2.2 g/dL (ref 1.5–4.5)
Glucose: 121 mg/dL — ABNORMAL HIGH (ref 70–99)
Potassium: 4.1 mmol/L (ref 3.5–5.2)
Sodium: 139 mmol/L (ref 134–144)
Total Protein: 6.7 g/dL (ref 6.0–8.5)
eGFR: 65 mL/min/{1.73_m2} (ref >59)

## 2021-10-08 LAB — HEMOGLOBIN A1C
Est. average glucose Bld gHb Est-mCnc: 137 mg/dL
Hgb A1c MFr Bld: 6.4 % — ABNORMAL HIGH (ref 4.8–5.6)

## 2021-10-13 MED ORDER — ROSUVASTATIN CALCIUM 10 MG PO TABS: 10.0000 mg | ORAL_TABLET | Freq: Every day | ORAL | 1 refills | Status: DC

## 2021-10-19 DIAGNOSIS — M9902 Segmental and somatic dysfunction of thoracic region: Secondary | ICD-10-CM | POA: Diagnosis not present

## 2021-10-19 DIAGNOSIS — M5417 Radiculopathy, lumbosacral region: Secondary | ICD-10-CM | POA: Diagnosis not present

## 2021-10-19 DIAGNOSIS — M9903 Segmental and somatic dysfunction of lumbar region: Secondary | ICD-10-CM | POA: Diagnosis not present

## 2021-10-19 DIAGNOSIS — M9904 Segmental and somatic dysfunction of sacral region: Secondary | ICD-10-CM | POA: Diagnosis not present

## 2021-10-19 DIAGNOSIS — M5386 Other specified dorsopathies, lumbar region: Secondary | ICD-10-CM | POA: Diagnosis not present

## 2021-10-19 DIAGNOSIS — M5137 Other intervertebral disc degeneration, lumbosacral region: Secondary | ICD-10-CM | POA: Diagnosis not present

## 2021-10-19 DIAGNOSIS — M9905 Segmental and somatic dysfunction of pelvic region: Secondary | ICD-10-CM | POA: Diagnosis not present

## 2021-10-19 DIAGNOSIS — M5135 Other intervertebral disc degeneration, thoracolumbar region: Secondary | ICD-10-CM | POA: Diagnosis not present

## 2021-10-21 DIAGNOSIS — M9905 Segmental and somatic dysfunction of pelvic region: Secondary | ICD-10-CM | POA: Diagnosis not present

## 2021-10-21 DIAGNOSIS — M9904 Segmental and somatic dysfunction of sacral region: Secondary | ICD-10-CM | POA: Diagnosis not present

## 2021-10-21 DIAGNOSIS — M9902 Segmental and somatic dysfunction of thoracic region: Secondary | ICD-10-CM | POA: Diagnosis not present

## 2021-10-21 DIAGNOSIS — M5135 Other intervertebral disc degeneration, thoracolumbar region: Secondary | ICD-10-CM | POA: Diagnosis not present

## 2021-10-21 DIAGNOSIS — M5137 Other intervertebral disc degeneration, lumbosacral region: Secondary | ICD-10-CM | POA: Diagnosis not present

## 2021-10-21 DIAGNOSIS — M5417 Radiculopathy, lumbosacral region: Secondary | ICD-10-CM | POA: Diagnosis not present

## 2021-10-21 DIAGNOSIS — M5386 Other specified dorsopathies, lumbar region: Secondary | ICD-10-CM | POA: Diagnosis not present

## 2021-10-21 DIAGNOSIS — M9903 Segmental and somatic dysfunction of lumbar region: Secondary | ICD-10-CM | POA: Diagnosis not present

## 2021-10-28 DIAGNOSIS — M5386 Other specified dorsopathies, lumbar region: Secondary | ICD-10-CM | POA: Diagnosis not present

## 2021-10-28 DIAGNOSIS — M9903 Segmental and somatic dysfunction of lumbar region: Secondary | ICD-10-CM | POA: Diagnosis not present

## 2021-10-28 DIAGNOSIS — M5137 Other intervertebral disc degeneration, lumbosacral region: Secondary | ICD-10-CM | POA: Diagnosis not present

## 2021-10-28 DIAGNOSIS — M9905 Segmental and somatic dysfunction of pelvic region: Secondary | ICD-10-CM | POA: Diagnosis not present

## 2021-10-28 DIAGNOSIS — M5135 Other intervertebral disc degeneration, thoracolumbar region: Secondary | ICD-10-CM | POA: Diagnosis not present

## 2021-10-28 DIAGNOSIS — M5417 Radiculopathy, lumbosacral region: Secondary | ICD-10-CM | POA: Diagnosis not present

## 2021-10-28 DIAGNOSIS — M9902 Segmental and somatic dysfunction of thoracic region: Secondary | ICD-10-CM | POA: Diagnosis not present

## 2021-10-28 DIAGNOSIS — M9904 Segmental and somatic dysfunction of sacral region: Secondary | ICD-10-CM | POA: Diagnosis not present

## 2021-10-29 DIAGNOSIS — N3943 Post-void dribbling: Secondary | ICD-10-CM | POA: Diagnosis not present

## 2021-10-29 DIAGNOSIS — R3912 Poor urinary stream: Secondary | ICD-10-CM | POA: Diagnosis not present

## 2021-10-29 DIAGNOSIS — E291 Testicular hypofunction: Secondary | ICD-10-CM | POA: Diagnosis not present

## 2021-10-30 DIAGNOSIS — M5386 Other specified dorsopathies, lumbar region: Secondary | ICD-10-CM | POA: Diagnosis not present

## 2021-10-30 DIAGNOSIS — M9905 Segmental and somatic dysfunction of pelvic region: Secondary | ICD-10-CM | POA: Diagnosis not present

## 2021-10-30 DIAGNOSIS — M9904 Segmental and somatic dysfunction of sacral region: Secondary | ICD-10-CM | POA: Diagnosis not present

## 2021-10-30 DIAGNOSIS — M9902 Segmental and somatic dysfunction of thoracic region: Secondary | ICD-10-CM | POA: Diagnosis not present

## 2021-10-30 DIAGNOSIS — M5417 Radiculopathy, lumbosacral region: Secondary | ICD-10-CM | POA: Diagnosis not present

## 2021-10-30 DIAGNOSIS — M5135 Other intervertebral disc degeneration, thoracolumbar region: Secondary | ICD-10-CM | POA: Diagnosis not present

## 2021-10-30 DIAGNOSIS — M5137 Other intervertebral disc degeneration, lumbosacral region: Secondary | ICD-10-CM | POA: Diagnosis not present

## 2021-10-30 DIAGNOSIS — M9903 Segmental and somatic dysfunction of lumbar region: Secondary | ICD-10-CM | POA: Diagnosis not present

## 2021-11-09 DIAGNOSIS — M9902 Segmental and somatic dysfunction of thoracic region: Secondary | ICD-10-CM | POA: Diagnosis not present

## 2021-11-09 DIAGNOSIS — M5135 Other intervertebral disc degeneration, thoracolumbar region: Secondary | ICD-10-CM | POA: Diagnosis not present

## 2021-11-09 DIAGNOSIS — M5386 Other specified dorsopathies, lumbar region: Secondary | ICD-10-CM | POA: Diagnosis not present

## 2021-11-09 DIAGNOSIS — M9904 Segmental and somatic dysfunction of sacral region: Secondary | ICD-10-CM | POA: Diagnosis not present

## 2021-11-09 DIAGNOSIS — M9905 Segmental and somatic dysfunction of pelvic region: Secondary | ICD-10-CM | POA: Diagnosis not present

## 2021-11-09 DIAGNOSIS — M9903 Segmental and somatic dysfunction of lumbar region: Secondary | ICD-10-CM | POA: Diagnosis not present

## 2021-11-09 DIAGNOSIS — M5137 Other intervertebral disc degeneration, lumbosacral region: Secondary | ICD-10-CM | POA: Diagnosis not present

## 2021-11-09 DIAGNOSIS — M5417 Radiculopathy, lumbosacral region: Secondary | ICD-10-CM | POA: Diagnosis not present

## 2021-11-11 DIAGNOSIS — M9903 Segmental and somatic dysfunction of lumbar region: Secondary | ICD-10-CM | POA: Diagnosis not present

## 2021-11-11 DIAGNOSIS — M9902 Segmental and somatic dysfunction of thoracic region: Secondary | ICD-10-CM | POA: Diagnosis not present

## 2021-11-11 DIAGNOSIS — M9904 Segmental and somatic dysfunction of sacral region: Secondary | ICD-10-CM | POA: Diagnosis not present

## 2021-11-11 DIAGNOSIS — M5137 Other intervertebral disc degeneration, lumbosacral region: Secondary | ICD-10-CM | POA: Diagnosis not present

## 2021-11-11 DIAGNOSIS — M5417 Radiculopathy, lumbosacral region: Secondary | ICD-10-CM | POA: Diagnosis not present

## 2021-11-11 DIAGNOSIS — M5135 Other intervertebral disc degeneration, thoracolumbar region: Secondary | ICD-10-CM | POA: Diagnosis not present

## 2021-11-11 DIAGNOSIS — M5386 Other specified dorsopathies, lumbar region: Secondary | ICD-10-CM | POA: Diagnosis not present

## 2021-11-11 DIAGNOSIS — M9905 Segmental and somatic dysfunction of pelvic region: Secondary | ICD-10-CM | POA: Diagnosis not present

## 2021-11-16 ENCOUNTER — Other Ambulatory Visit: Payer: Self-pay | Admitting: Nurse Practitioner

## 2021-11-18 DIAGNOSIS — M5417 Radiculopathy, lumbosacral region: Secondary | ICD-10-CM | POA: Diagnosis not present

## 2021-11-18 DIAGNOSIS — M5135 Other intervertebral disc degeneration, thoracolumbar region: Secondary | ICD-10-CM | POA: Diagnosis not present

## 2021-11-18 DIAGNOSIS — M5137 Other intervertebral disc degeneration, lumbosacral region: Secondary | ICD-10-CM | POA: Diagnosis not present

## 2021-11-18 DIAGNOSIS — M5386 Other specified dorsopathies, lumbar region: Secondary | ICD-10-CM | POA: Diagnosis not present

## 2021-11-18 DIAGNOSIS — M9902 Segmental and somatic dysfunction of thoracic region: Secondary | ICD-10-CM | POA: Diagnosis not present

## 2021-11-18 DIAGNOSIS — M9903 Segmental and somatic dysfunction of lumbar region: Secondary | ICD-10-CM | POA: Diagnosis not present

## 2021-11-18 DIAGNOSIS — M9904 Segmental and somatic dysfunction of sacral region: Secondary | ICD-10-CM | POA: Diagnosis not present

## 2021-11-18 DIAGNOSIS — M9905 Segmental and somatic dysfunction of pelvic region: Secondary | ICD-10-CM | POA: Diagnosis not present

## 2021-11-20 DIAGNOSIS — M5135 Other intervertebral disc degeneration, thoracolumbar region: Secondary | ICD-10-CM | POA: Diagnosis not present

## 2021-11-20 DIAGNOSIS — M9902 Segmental and somatic dysfunction of thoracic region: Secondary | ICD-10-CM | POA: Diagnosis not present

## 2021-11-20 DIAGNOSIS — M5417 Radiculopathy, lumbosacral region: Secondary | ICD-10-CM | POA: Diagnosis not present

## 2021-11-20 DIAGNOSIS — M9903 Segmental and somatic dysfunction of lumbar region: Secondary | ICD-10-CM | POA: Diagnosis not present

## 2021-11-20 DIAGNOSIS — M9905 Segmental and somatic dysfunction of pelvic region: Secondary | ICD-10-CM | POA: Diagnosis not present

## 2021-11-20 DIAGNOSIS — M5386 Other specified dorsopathies, lumbar region: Secondary | ICD-10-CM | POA: Diagnosis not present

## 2021-11-20 DIAGNOSIS — M5137 Other intervertebral disc degeneration, lumbosacral region: Secondary | ICD-10-CM | POA: Diagnosis not present

## 2021-11-20 DIAGNOSIS — M9904 Segmental and somatic dysfunction of sacral region: Secondary | ICD-10-CM | POA: Diagnosis not present

## 2021-11-27 DIAGNOSIS — M9902 Segmental and somatic dysfunction of thoracic region: Secondary | ICD-10-CM | POA: Diagnosis not present

## 2021-11-27 DIAGNOSIS — M5135 Other intervertebral disc degeneration, thoracolumbar region: Secondary | ICD-10-CM | POA: Diagnosis not present

## 2021-11-27 DIAGNOSIS — M5386 Other specified dorsopathies, lumbar region: Secondary | ICD-10-CM | POA: Diagnosis not present

## 2021-11-27 DIAGNOSIS — M5417 Radiculopathy, lumbosacral region: Secondary | ICD-10-CM | POA: Diagnosis not present

## 2021-11-27 DIAGNOSIS — M5137 Other intervertebral disc degeneration, lumbosacral region: Secondary | ICD-10-CM | POA: Diagnosis not present

## 2021-11-27 DIAGNOSIS — M9905 Segmental and somatic dysfunction of pelvic region: Secondary | ICD-10-CM | POA: Diagnosis not present

## 2021-11-27 DIAGNOSIS — M9903 Segmental and somatic dysfunction of lumbar region: Secondary | ICD-10-CM | POA: Diagnosis not present

## 2021-11-27 DIAGNOSIS — M9904 Segmental and somatic dysfunction of sacral region: Secondary | ICD-10-CM | POA: Diagnosis not present

## 2021-11-30 DIAGNOSIS — M5135 Other intervertebral disc degeneration, thoracolumbar region: Secondary | ICD-10-CM | POA: Diagnosis not present

## 2021-11-30 DIAGNOSIS — M9905 Segmental and somatic dysfunction of pelvic region: Secondary | ICD-10-CM | POA: Diagnosis not present

## 2021-11-30 DIAGNOSIS — M9902 Segmental and somatic dysfunction of thoracic region: Secondary | ICD-10-CM | POA: Diagnosis not present

## 2021-11-30 DIAGNOSIS — M9904 Segmental and somatic dysfunction of sacral region: Secondary | ICD-10-CM | POA: Diagnosis not present

## 2021-11-30 DIAGNOSIS — M5417 Radiculopathy, lumbosacral region: Secondary | ICD-10-CM | POA: Diagnosis not present

## 2021-11-30 DIAGNOSIS — M5386 Other specified dorsopathies, lumbar region: Secondary | ICD-10-CM | POA: Diagnosis not present

## 2021-11-30 DIAGNOSIS — M5137 Other intervertebral disc degeneration, lumbosacral region: Secondary | ICD-10-CM | POA: Diagnosis not present

## 2021-11-30 DIAGNOSIS — M9903 Segmental and somatic dysfunction of lumbar region: Secondary | ICD-10-CM | POA: Diagnosis not present

## 2021-12-07 DIAGNOSIS — M5417 Radiculopathy, lumbosacral region: Secondary | ICD-10-CM | POA: Diagnosis not present

## 2021-12-07 DIAGNOSIS — M9903 Segmental and somatic dysfunction of lumbar region: Secondary | ICD-10-CM | POA: Diagnosis not present

## 2021-12-07 DIAGNOSIS — M5137 Other intervertebral disc degeneration, lumbosacral region: Secondary | ICD-10-CM | POA: Diagnosis not present

## 2021-12-07 DIAGNOSIS — M5386 Other specified dorsopathies, lumbar region: Secondary | ICD-10-CM | POA: Diagnosis not present

## 2021-12-07 DIAGNOSIS — M9902 Segmental and somatic dysfunction of thoracic region: Secondary | ICD-10-CM | POA: Diagnosis not present

## 2021-12-07 DIAGNOSIS — M5135 Other intervertebral disc degeneration, thoracolumbar region: Secondary | ICD-10-CM | POA: Diagnosis not present

## 2021-12-07 DIAGNOSIS — M9904 Segmental and somatic dysfunction of sacral region: Secondary | ICD-10-CM | POA: Diagnosis not present

## 2021-12-07 DIAGNOSIS — M9905 Segmental and somatic dysfunction of pelvic region: Secondary | ICD-10-CM | POA: Diagnosis not present

## 2021-12-16 DIAGNOSIS — M9902 Segmental and somatic dysfunction of thoracic region: Secondary | ICD-10-CM | POA: Diagnosis not present

## 2021-12-16 DIAGNOSIS — M5135 Other intervertebral disc degeneration, thoracolumbar region: Secondary | ICD-10-CM | POA: Diagnosis not present

## 2021-12-16 DIAGNOSIS — M9904 Segmental and somatic dysfunction of sacral region: Secondary | ICD-10-CM | POA: Diagnosis not present

## 2021-12-16 DIAGNOSIS — M9903 Segmental and somatic dysfunction of lumbar region: Secondary | ICD-10-CM | POA: Diagnosis not present

## 2021-12-16 DIAGNOSIS — M5417 Radiculopathy, lumbosacral region: Secondary | ICD-10-CM | POA: Diagnosis not present

## 2021-12-16 DIAGNOSIS — M5386 Other specified dorsopathies, lumbar region: Secondary | ICD-10-CM | POA: Diagnosis not present

## 2021-12-16 DIAGNOSIS — M9905 Segmental and somatic dysfunction of pelvic region: Secondary | ICD-10-CM | POA: Diagnosis not present

## 2021-12-16 DIAGNOSIS — M5137 Other intervertebral disc degeneration, lumbosacral region: Secondary | ICD-10-CM | POA: Diagnosis not present

## 2022-01-05 ENCOUNTER — Other Ambulatory Visit: Payer: Self-pay

## 2022-01-05 DIAGNOSIS — L439 Lichen planus, unspecified: Secondary | ICD-10-CM

## 2022-01-05 MED ORDER — CLOBETASOL PROP EMOLLIENT BASE 0.05 % EX CREA: TOPICAL_CREAM | CUTANEOUS | 4 refills | Status: DC

## 2022-01-06 NOTE — Telephone Encounter (Signed)
This encounter was created in error - please disregard.

## 2022-01-18 ENCOUNTER — Encounter: Payer: Self-pay | Admitting: Dermatology

## 2022-01-18 ENCOUNTER — Encounter: Payer: Self-pay | Admitting: Nurse Practitioner

## 2022-01-21 ENCOUNTER — Encounter: Payer: Self-pay | Admitting: Nurse Practitioner

## 2022-01-21 ENCOUNTER — Ambulatory Visit (INDEPENDENT_AMBULATORY_CARE_PROVIDER_SITE_OTHER): Payer: Medicare PPO | Admitting: Nurse Practitioner

## 2022-01-21 VITALS — BP 134/78 | HR 87 | Temp 98.3°F | Ht 68.0 in | Wt 183.0 lb

## 2022-01-21 DIAGNOSIS — H9313 Tinnitus, bilateral: Secondary | ICD-10-CM | POA: Diagnosis not present

## 2022-01-21 NOTE — Patient Instructions (Signed)
Tinnitus ?Tinnitus refers to hearing a sound when there is no actual source for that sound. This is often described as ringing in the ears. However, people with this condition may hear a variety of noises, in one ear or in both ears. ?The sounds of tinnitus can be soft, loud, or somewhere in between. Tinnitus can last for a few seconds or can be constant for days. It may go away without treatment and come back at various times. When tinnitus is constant or happens often, it can lead to other problems, such as trouble sleeping and trouble concentrating. ?Almost everyone experiences tinnitus at some point. Tinnitus is not the same as hearing loss. Tinnitus that is long-lasting (chronic) or comes back often (recurs) may require medical attention. ?What are the causes? ?The cause of tinnitus is often not known. In some cases, it can result from: ?Exposure to loud noises from machinery, music, or other sources. ?An object (foreign body) stuck in the ear. ?Earwax buildup. ?Drinking alcohol or caffeine. ?Taking certain medicines. ?Age-related hearing loss. ?It may also be caused by medical conditions such as: ?Ear or sinus infections. ?Heart diseases or high blood pressure. ?Allergies. ?M?ni?re's disease. ?Thyroid problems. ?Tumors. ?A weak, bulging blood vessel (aneurysm) near the ear. ?What increases the risk? ?The following factors may make you more likely to develop this condition: ?Exposure to loud noises. ?Age. Tinnitus is more likely in older individuals. ?Using alcohol or tobacco. ?What are the signs or symptoms? ?The main symptom of tinnitus is hearing a sound when there is no source for that sound. It may sound like: ?Buzzing. ?Sizzling. ?Ringing. ?Blowing air. ?Hissing. ?Whistling. ?Other sounds may include: ?Roaring. ?Running water. ?A musical note. ?Tapping. ?Humming. ?Symptoms may affect only one ear (unilateral) or both ears (bilateral). ?How is this diagnosed? ?Tinnitus is diagnosed based on your symptoms,  your medical history, and a physical exam. Your health care provider may do a thorough hearing test (audiologic exam) if your tinnitus: ?Is unilateral. ?Causes hearing difficulties. ?Lasts 6 months or longer. ?You may work with a health care provider who specializes in hearing disorders (audiologist). You may be asked questions about your symptoms and how they affect your daily life. You may have other tests done, such as: ?CT scan. ?MRI. ?An imaging test of how blood flows through your blood vessels (angiogram). ?How is this treated? ?Treating an underlying medical condition can sometimes make tinnitus go away. If your tinnitus continues, other treatments may include: ?Therapy and counseling to help you manage the stress of living with tinnitus. ?Sound generators to mask the tinnitus. These include: ?Tabletop sound machines that play relaxing sounds to help you fall asleep. ?Wearable devices that fit in your ear and play sounds or music. ?Acoustic neural stimulation. This involves using headphones to listen to music that contains an auditory signal. Over time, listening to this signal may change some pathways in your brain and make you less sensitive to tinnitus. This treatment is used for very severe cases when no other treatment is working. ?Using hearing aids or cochlear implants if your tinnitus is related to hearing loss. Hearing aids are worn in the outer ear. Cochlear implants are surgically placed in the inner ear. ?Follow these instructions at home: ?Managing symptoms ? ?  ? ?When possible, avoid being in loud places and being exposed to loud sounds. ?Wear hearing protection, such as earplugs, when you are exposed to loud noises. ?Use a white noise machine, a humidifier, or other devices to mask the sound of tinnitus. ?  Practice techniques for reducing stress, such as meditation, yoga, or deep breathing. Work with your health care provider if you need help with managing stress. ?Sleep with your head  slightly raised. This may reduce the impact of tinnitus. ?General instructions ?Do not use stimulants, such as nicotine, alcohol, or caffeine. Talk with your health care provider about other stimulants to avoid. Stimulants are substances that can make you feel alert and attentive by increasing certain activities in the body (such as heart rate and blood pressure). These substances may make tinnitus worse. ?Take over-the-counter and prescription medicines only as told by your health care provider. ?Try to get plenty of sleep each night. ?Keep all follow-up visits. This is important. ?Contact a health care provider if: ?Your tinnitus continues for 3 weeks or longer without stopping. ?You develop sudden hearing loss. ?Your symptoms get worse or do not get better with home care. ?You feel you are not able to manage the stress of living with tinnitus. ?Get help right away if: ?You develop tinnitus after a head injury. ?You have tinnitus along with any of the following: ?Dizziness. ?Nausea and vomiting. ?Loss of balance. ?Sudden, severe headache. ?Vision changes. ?Facial weakness or weakness of arms or legs. ?These symptoms may represent a serious problem that is an emergency. Do not wait to see if the symptoms will go away. Get medical help right away. Call your local emergency services (911 in the [place] Do not drive yourself to the hospital. ?Summary ?Tinnitus refers to hearing a sound when there is no actual source for that sound. This is often described as ringing in the ears. ?Symptoms may affect only one ear (unilateral) or both ears (bilateral). ?Use a white noise machine, a humidifier, or other devices to mask the sound of tinnitus. ?Do not use stimulants, such as nicotine, alcohol, or caffeine. These substances may make tinnitus worse. ?This information is not intended to replace advice given to you by your health care provider. Make sure you discuss any questions you have with your health care  provider. ?Document Revised: 05/12/2020 Document Reviewed: 05/12/2020 ?Elsevier Patient Education ? 2023 Elsevier Inc. ? ?

## 2022-01-21 NOTE — Progress Notes (Signed)
I,Tianna Badgett,acting as a Education administrator for [employer], FNP.,have documented all relevant documentation on the behalf of Minette Brine, FNP,as directed by  Minette Brine, FNP while in the presence of Minette Brine, [place].  Subjective:     Patient ID: Robert Donaldson , male    DOB: [birth year] , 66 y.o.   MRN: [id number]   Chief Complaint  Patient presents with   Tinnitus    HPI  Patient presents today for ringing of bilateral ears for the last several years has worsened in the last 2-3 months, feels like the symptoms are worsening. Mostly hear in the background. Feels like the beginning of his wife's statements are jumbled.      Past Medical History:  Diagnosis Date   HLD (hyperlipidemia)    Vitamin D deficiency      Family History  Problem Relation Age of Onset   Hyperlipidemia Sister      Current Outpatient Medications:    acyclovir ointment (ZOVIRAX) 5 %, APPLY 1 APPLICATION TOPICALLY EVERY 3 HOURS, Disp: 30 g, Rfl: 2   celecoxib (CELEBREX) 200 MG capsule, Take 200 mg by mouth daily. Takes as needed, Disp: , Rfl:    Clobetasol Prop Emollient Base 0.05 % emollient cream, Apply topically 2 (two) times a day if needed (Rash)., Disp: 45 g, Rfl: 4   cyclobenzaprine (FLEXERIL) 10 MG tablet, Take 1 tablet (10 mg total) by mouth 3 (three) times daily as needed for muscle spasms., Disp: 30 tablet, Rfl: 0   diclofenac Sodium (VOLTAREN) 1 % GEL, Apply 2 g topically 4 (four) times daily., Disp: 100 g, Rfl: 2   meloxicam (MOBIC) 7.5 MG tablet, Take by mouth. As needed, Disp: , Rfl:    Nutritional Supplements (VITAMIN D BOOSTER PO), Take 1 capsule by mouth daily., Disp: , Rfl:    rosuvastatin (CRESTOR) 10 MG tablet, Take 1 tablet (10 mg total) by mouth daily., Disp: 90 tablet, Rfl: 1   triamcinolone (KENALOG) 0.025 % ointment, Apply 1 application topically 2 (two) times daily., Disp: 30 g, Rfl: 0   vitamin E 200 UNIT capsule, Take 5,000 Units by mouth daily. , Disp: , Rfl:    No Known Allergies    Review of Systems  Constitutional: Negative.   HENT:  Positive for tinnitus.   Respiratory: Negative.    Cardiovascular: Negative.   Gastrointestinal: Negative.   Neurological: Negative.   Psychiatric/Behavioral: Negative.       Today's Vitals   01/21/22 1503  BP: 134/78  Pulse: 87  Temp: 98.3 F (36.8 C)  TempSrc: Oral  Weight: 183 lb (83 kg)  Height: '5\' 8"'$  (1.727 m)   Body mass index is 27.83 kg/m.  Wt Readings from Last 3 Encounters:  01/21/22 183 lb (83 kg)  10/07/21 184 lb (83.5 kg)  08/13/21 184 lb (83.5 kg)    Objective:  Physical Exam Vitals reviewed.  Constitutional:      General: He is not in acute distress.    Appearance: Normal appearance. He is normal weight.  HENT:     Right Ear: Tympanic membrane, ear canal and external ear normal. There is no impacted cerumen.     Left Ear: Tympanic membrane, ear canal and external ear normal. There is no impacted cerumen.  Neck:     Vascular: No carotid bruit.  Cardiovascular:     Rate and Rhythm: Normal rate and regular rhythm.     Pulses: Normal pulses.     Heart sounds: Normal heart sounds. No murmur heard. Pulmonary:  Effort: Pulmonary effort is normal. No respiratory distress.     Breath sounds: Normal breath sounds. No wheezing.  Musculoskeletal:     Cervical back: Normal range of motion and neck supple.  Skin:    General: Skin is warm and dry.     Capillary Refill: Capillary refill takes less than 2 seconds.  Neurological:     General: No focal deficit present.     Mental Status: He is alert and oriented to person, place, and time.     Cranial Nerves: No cranial nerve deficit.     Motor: No weakness.  Psychiatric:        Mood and Affect: Mood normal.        Behavior: Behavior normal.        Thought Content: Thought content normal.        Judgment: Judgment normal.         Assessment And Plan:     1. Tinnitus of both ears Comments: No abnormal findings on physical exam, will check  carotid doppler and metabolic labs. Pending results will refer to ENT for further evaluation. Stay hydrated and avoid taking aspirin - TSH - Iron, TIBC and Ferritin Panel - US Carotid Duplex Bilateral; Future - AMB Referral to [place]     Patient was given opportunity to ask questions. Patient verbalized understanding of the plan and was able to repeat key elements of the plan. All questions were answered to their satisfaction.  Minette Brine, FNP   I, Minette Brine, FNP, have reviewed all documentation for this visit. The documentation on 01/21/22 for the exam, diagnosis, procedures, and orders are all accurate and complete.   IF YOU HAVE BEEN REFERRED TO A SPECIALIST, IT MAY TAKE 1-2 WEEKS TO SCHEDULE/PROCESS THE REFERRAL. IF YOU HAVE NOT HEARD FROM US/SPECIALIST IN TWO WEEKS, PLEASE GIVE [place] A CALL AT [phone] X [phone].   THE PATIENT IS ENCOURAGED TO PRACTICE SOCIAL DISTANCING DUE TO THE COVID-19 PANDEMIC.

## 2022-01-22 ENCOUNTER — Other Ambulatory Visit: Payer: Self-pay | Admitting: Nurse Practitioner

## 2022-01-22 DIAGNOSIS — D508 Other iron deficiency anemias: Secondary | ICD-10-CM

## 2022-01-22 LAB — IRON,TIBC AND FERRITIN PANEL
Ferritin: 413 ng/mL — ABNORMAL HIGH (ref 30–400)
Iron Saturation: 8 % — CL (ref 15–55)
Iron: 23 ug/dL — ABNORMAL LOW (ref 38–169)
Total Iron Binding Capacity: 293 ug/dL (ref 250–450)
UIBC: 270 ug/dL (ref 111–343)

## 2022-01-22 LAB — TSH: TSH: 1.03 u[IU]/mL (ref 0.450–4.500)

## 2022-01-22 MED ORDER — FERROUS SULFATE 325 (65 FE) MG PO TBEC: 325.0000 mg | DELAYED_RELEASE_TABLET | Freq: Two times a day (BID) | ORAL | 1 refills | Status: DC

## 2022-01-26 ENCOUNTER — Ambulatory Visit
Admission: RE | Admit: 2022-01-26 | Discharge: 2022-01-26 | Disposition: A | Discharge status: Home / Self Care | Payer: Medicare PPO | Source: Ambulatory Visit | Attending: Nurse Practitioner | Admitting: Nurse Practitioner

## 2022-01-26 DIAGNOSIS — H9313 Tinnitus, bilateral: Secondary | ICD-10-CM

## 2022-01-26 DIAGNOSIS — I6523 Occlusion and stenosis of bilateral carotid arteries: Secondary | ICD-10-CM | POA: Diagnosis not present

## 2022-02-04 DIAGNOSIS — N3943 Post-void dribbling: Secondary | ICD-10-CM | POA: Diagnosis not present

## 2022-02-04 DIAGNOSIS — R3912 Poor urinary stream: Secondary | ICD-10-CM | POA: Diagnosis not present

## 2022-02-10 ENCOUNTER — Ambulatory Visit: Payer: TRICARE For Life (TFL) | Admitting: Nurse Practitioner

## 2022-02-12 ENCOUNTER — Telehealth: Payer: Self-pay | Admitting: *Deleted

## 2022-02-12 NOTE — Chronic Care Management (AMB) (Signed)
  Chronic Care Management   Note  02/12/2022 Name: Robert Donaldson MRN: [id number] DOB: Nov 16, 1955  Robert Donaldson is a 66 y.o. year old male who is a primary care patient of Robert Donaldson, Robert Donaldson. I reached out to Robert Donaldson by phone today in response to a referral sent by Robert Donaldson PCP.  Robert Donaldson was given information about Chronic Care Management services today including:  CCM service includes personalized support from designated clinical staff supervised by his physician, including individualized plan of care and coordination with other care providers 24/7 contact phone numbers for assistance for urgent and routine care needs. Service will only be billed when office clinical staff spend 20 minutes or more in a month to coordinate care. Only one practitioner may furnish and bill the service in a calendar month. The patient may stop CCM services at any time (effective at the end of the month) by phone call to the office staff. The patient is responsible for co-pay (up to 20% after annual deductible is met) if co-pay is required by the individual health plan.   Patient agreed to services and verbal consent obtained.   Follow up plan: Telephone appointment with care management team member scheduled for:03/26/22  [place]  Direct Dial: [phone]

## 2022-02-15 ENCOUNTER — Ambulatory Visit (INDEPENDENT_AMBULATORY_CARE_PROVIDER_SITE_OTHER): Payer: Medicare PPO

## 2022-02-15 ENCOUNTER — Ambulatory Visit (INDEPENDENT_AMBULATORY_CARE_PROVIDER_SITE_OTHER): Payer: TRICARE For Life (TFL) | Admitting: Podiatry

## 2022-02-15 ENCOUNTER — Ambulatory Visit (INDEPENDENT_AMBULATORY_CARE_PROVIDER_SITE_OTHER): Payer: TRICARE For Life (TFL)

## 2022-02-15 DIAGNOSIS — G5763 Lesion of plantar nerve, bilateral lower limbs: Secondary | ICD-10-CM

## 2022-02-15 NOTE — Progress Notes (Signed)
   Chief Complaint  Patient presents with   Foot Pain    Right foot pain in the bottom of the foot.    HPI: 66 y.o. male presenting today for new complaint of pain and tenderness associated to the bilateral forefoot.  Patient states that he feels as if "his socks are bunched up underneath his toes" and that he is fat pad feels extra cushiony.  It is not necessarily painful but it is irritating.  He presents for further treatment and evaluation.  This has been ongoing on for about 1 year now  Past Medical History:  Diagnosis Date   HLD (hyperlipidemia)    Vitamin D deficiency     Past Surgical History:  Procedure Laterality Date   KNEE SURGERY      No Known Allergies   Physical Exam: General: The patient is alert and oriented x3 in no acute distress.  Dermatology: Skin is warm, dry and supple bilateral lower extremities. Negative for open lesions or macerations.    Vascular: Palpable pedal pulses bilaterally. Capillary refill within normal limits.  Negative for any significant edema or erythema  Neurological: Light touch and protective threshold grossly intact.Paresthesia with numbness sensation and the feeling of "socks bunched up under his toes" noted bilateral  Musculoskeletal Exam: No pedal deformities noted  Radiographic Exam:  Normal osseous mineralization. Joint spaces preserved. No fracture/dislocation/boney destruction.    Assessment: 1.  Morton's metatarsalgia/neuritis bilateral forefoot   Plan of Care:  1. Patient evaluated. X-Rays reviewed.  2.  Explained to the patient that I do believe he is suffering from a irritation of the nerves to the bilateral forefoot possibly caused by narrow fitting shoes. 3.  Recommend wide fitting shoes that do not constrict the toebox area 4.  Patient has meloxicam at home.  Recommend meloxicam 15 mg daily as needed 5.  Return to clinic as needed      Edrick Kins, DPM [facility]  Dr. Edrick Kins, DPM     [address],  [address]                Office [phone]  Fax [phone]

## 2022-02-15 NOTE — Progress Notes (Signed)
Dg  

## 2022-02-16 ENCOUNTER — Ambulatory Visit: Payer: TRICARE For Life (TFL) | Admitting: Nurse Practitioner

## 2022-02-16 DIAGNOSIS — H40023 Open angle with borderline findings, high risk, bilateral: Secondary | ICD-10-CM | POA: Diagnosis not present

## 2022-02-16 DIAGNOSIS — H2513 Age-related nuclear cataract, bilateral: Secondary | ICD-10-CM | POA: Diagnosis not present

## 2022-02-16 DIAGNOSIS — H52223 Regular astigmatism, bilateral: Secondary | ICD-10-CM | POA: Diagnosis not present

## 2022-02-16 DIAGNOSIS — H5203 Hypermetropia, bilateral: Secondary | ICD-10-CM | POA: Diagnosis not present

## 2022-02-16 DIAGNOSIS — H353112 Nonexudative age-related macular degeneration, right eye, intermediate dry stage: Secondary | ICD-10-CM | POA: Diagnosis not present

## 2022-02-16 DIAGNOSIS — H524 Presbyopia: Secondary | ICD-10-CM | POA: Diagnosis not present

## 2022-03-04 LAB — LIPID PANEL
Cholesterol: 164 (ref 0–200)
HDL: 60 (ref 35–70)
LDL Cholesterol: 86
Triglycerides: 88 (ref 40–160)

## 2022-03-04 LAB — PSA: PSA: 0.925

## 2022-03-04 LAB — HEMOGLOBIN A1C: Hemoglobin A1C: 6.4

## 2022-03-13 ENCOUNTER — Encounter (HOSPITAL_BASED_OUTPATIENT_CLINIC_OR_DEPARTMENT_OTHER): Payer: Self-pay | Admitting: *Deleted

## 2022-03-13 ENCOUNTER — Emergency Department (HOSPITAL_BASED_OUTPATIENT_CLINIC_OR_DEPARTMENT_OTHER): Payer: Medicare PPO | Admitting: Radiology

## 2022-03-13 ENCOUNTER — Emergency Department (HOSPITAL_BASED_OUTPATIENT_CLINIC_OR_DEPARTMENT_OTHER)
Admission: EM | Admit: 2022-03-13 | Discharge: 2022-03-13 | Disposition: A | Discharge status: Home / Self Care | Payer: Medicare PPO | Attending: Emergency Medicine | Admitting: Emergency Medicine

## 2022-03-13 DIAGNOSIS — M25572 Pain in left ankle and joints of left foot: Secondary | ICD-10-CM | POA: Diagnosis not present

## 2022-03-13 DIAGNOSIS — M79672 Pain in left foot: Secondary | ICD-10-CM | POA: Diagnosis not present

## 2022-03-13 DIAGNOSIS — T63441A Toxic effect of venom of bees, accidental (unintentional), initial encounter: Secondary | ICD-10-CM | POA: Insufficient documentation

## 2022-03-13 DIAGNOSIS — T63444A Toxic effect of venom of bees, undetermined, initial encounter: Secondary | ICD-10-CM

## 2022-03-13 MED ORDER — METHYLPREDNISOLONE 4 MG PO TBPK: ORAL_TABLET | ORAL | 0 refills | Status: DC

## 2022-03-13 NOTE — ED Notes (Signed)
Stung weds by multi yellow jackets, most concerning for Pt is the ones located above the left eye and above left side of upper lip. Both appear swollen. No SOB, numbness/tingling or any other aliments related to the stings... Also complaining of pain to the left foot, outer side. No recent trauma. Has Plantar fasciitis.

## 2022-03-13 NOTE — ED Triage Notes (Signed)
Patient was stung by multiple yellow jackets on Wednesday---swelling noted to left side of orbital region, alos has sting on lip area. Patient has taken benadryl. Swelling noted around left eye. No vision changes.   Patient also reports left foot pain that started this am---no swelling noted or bruising. No injury.

## 2022-03-13 NOTE — ED Provider Notes (Signed)
[place] EMERGENCY DEPT Provider Note   CSN: [id number] Arrival date & time: 03/13/22  1213     History  Chief Complaint  Patient presents with   Insect Bite   Foot Pain    Robert Donaldson is a 66 y.o. male.  Patient here for evaluation of bee sting to the left side of his face.  Taking Benadryl with some improvement.  Bee sting happened about 4 days ago.  Also here with pain to the bottom of his left foot.  History of plantar fasciitis.  Has a walking boot at his home that he is used in the past.  Has followed with podiatry in the past.  Denies any trauma or fall.  Denies any fever or chills.  The history is provided by the patient.       Home Medications Prior to Admission medications   Medication Sig Start Date End Date Taking? Authorizing Provider  methylPREDNISolone (MEDROL DOSEPAK) 4 MG TBPK tablet Follow package insert 03/13/22  Yes Robert Abramo, DO  acyclovir ointment (ZOVIRAX) 5 % APPLY 1 APPLICATION TOPICALLY EVERY 3 HOURS 11/18/21   Robert Brine, FNP  celecoxib (CELEBREX) 200 MG capsule Take 200 mg by mouth daily. Takes as needed 09/14/19   [provider]  Clobetasol Prop Emollient Base 0.05 % emollient cream Apply topically 2 (two) times a day if needed (Rash). 01/05/22   Robert Monarch, MD  cyclobenzaprine (FLEXERIL) 10 MG tablet Take 1 tablet (10 mg total) by mouth 3 (three) times daily as needed for muscle spasms. 08/13/21   Robert Brine, FNP  diclofenac Sodium (VOLTAREN) 1 % GEL Apply 2 g topically 4 (four) times daily. 08/13/21   Robert Brine, FNP  ferrous sulfate 325 (65 FE) MG EC tablet Take 1 tablet (325 mg total) by mouth 2 (two) times daily. 01/22/22 01/22/23  Robert Brine, FNP  meloxicam (MOBIC) 7.5 MG tablet Take by mouth. As needed 08/21/18   [provider]  Nutritional Supplements (VITAMIN D BOOSTER PO) Take 1 capsule by mouth daily.    [provider]  rosuvastatin (CRESTOR) 10 MG tablet Take 1 tablet (10 mg  total) by mouth daily. 10/13/21   Robert Brine, FNP  triamcinolone (KENALOG) 0.025 % ointment Apply 1 application topically 2 (two) times daily. 11/03/20   Robert Brine, FNP  vitamin E 200 UNIT capsule Take 5,000 Units by mouth daily.     [provider]      Allergies    Patient has no known allergies.    Review of Systems   Review of Systems  Physical Exam Updated Vital Signs BP 127/75 (BP Location: Right Arm)   Pulse 60   Temp 98.1 F (36.7 C) (Oral)   Resp 15   SpO2 100%  Physical Exam Vitals and nursing note reviewed.  Constitutional:      General: He is not in acute distress.    Appearance: He is well-developed.  HENT:     Head: Normocephalic and atraumatic.  Eyes:     Extraocular Movements: Extraocular movements intact.     Conjunctiva/sclera: Conjunctivae normal.     Pupils: Pupils are equal, round, and reactive to light.  Cardiovascular:     Rate and Rhythm: Normal rate and regular rhythm.     Heart sounds: No murmur heard. Pulmonary:     Effort: Pulmonary effort is normal. No respiratory distress.     Breath sounds: Normal breath sounds.  Abdominal:     Palpations: Abdomen is soft.  Tenderness: There is no abdominal tenderness.  Musculoskeletal:        General: Tenderness present. No swelling.     Cervical back: Neck supple.     Comments: Tenderness to the left plantar fascia of the left foot  Skin:    General: Skin is warm and dry.     Capillary Refill: Capillary refill takes less than 2 seconds.     Comments: 1 area of raised hive on the left eyebrow  Neurological:     Mental Status: He is alert.  Psychiatric:        Mood and Affect: Mood normal.     ED Results / Procedures / Treatments   Labs (all labs ordered are listed, but only abnormal results are displayed) Labs Reviewed - No data to display  EKG None  Radiology DG Foot Complete Left  Result Date: 03/13/2022 CLINICAL DATA:  Left foot pain for 3 days, no reported injury  EXAM: LEFT FOOT - COMPLETE 3+ VIEW COMPARISON:  02/15/2022 left foot radiographs FINDINGS: No fracture or dislocation. Lisfranc joint appears intact. No periosteal reaction or erosions. Small plantar left calcaneal spur. 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 No radiopaque foreign bodies. Vascular calcifications throughout the soft tissues. IMPRESSION: 1. No acute osseous abnormality in the left foot. 2. 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 3. Small plantar left calcaneal spur. Electronically Signed   By: Robert Donaldson M.D.   On: 03/13/2022 13:31    Procedures Procedures    Medications Ordered in ED Medications - No data to display  ED Course/ Medical Decision Making/ A&P                           Medical Decision Making Amount and/or Complexity of Data Reviewed Radiology: ordered.  Risk Prescription drug management.   Robert Donaldson is here to be evaluated for left foot pain and bee sting.  Normal vitals.  No fever.  Bee sting happened about 4 days ago.  Still has mild allergic process going on around the left side of the face.  Otherwise there is no concern for major process there.  No concern for infection.  He has had left foot pain the last several days as well.  History of plantar fasciitis.  Differential diagnosis is likely plantar fasciitis and less likely fracture.  Will get x-ray.  Have no concern for infectious process or gout.    X-ray shows no fracture or malalignment.  He has a walking boot at home that he will wear.  We will prescribe him Medrol Dosepak to help with both bee sting as well as inflammatory process in his foot and have him follow-up with podiatry.  He is neurovascular neuromuscular intact.  Discharged in good condition.  This chart was dictated using voice recognition software.  Despite best efforts to proofread,  errors can occur which can change the documentation meaning.         Final  Clinical Impression(s) / ED Diagnoses Final diagnoses:  Bee sting, undetermined intent, initial encounter  Pain of joint of left ankle and foot    Rx / DC Orders ED Discharge Orders          Ordered    methylPREDNISolone (MEDROL DOSEPAK) 4 MG TBPK tablet        03/13/22 1251              Lennice Sites, DO 03/13/22 1336

## 2022-03-13 NOTE — Discharge Instructions (Addendum)
Take steroid Dosepak as prescribed.  This should help with your bee sting and what I suspect is arthritis in your foot.  X-ray today showed no fracture but did show some areas of arthritis.  Recommend that you wear your walking boot and follow-up with foot doctor.

## 2022-03-14 ENCOUNTER — Encounter: Payer: Self-pay | Admitting: Nurse Practitioner

## 2022-03-14 ENCOUNTER — Encounter: Payer: Self-pay | Admitting: Podiatry

## 2022-03-15 ENCOUNTER — Encounter: Payer: Self-pay | Admitting: Nurse Practitioner

## 2022-03-15 ENCOUNTER — Telehealth: Payer: Self-pay

## 2022-03-15 NOTE — Telephone Encounter (Signed)
Transition Care Management Follow-up Telephone Call Date of discharge and from where: 03/13/2022 [facility]  How have you been since you were released from the hospital? Pt states he is doing okay, he does not need an appointment at the moment. Any questions or concerns? No  Items Reviewed: Did the pt receive and understand the discharge instructions provided? Yes  Medications obtained and verified? Yes  Other? No  Any new allergies since your discharge? No  Dietary orders reviewed? Yes Do you have support at home? Yes   Home Care and Equipment/Supplies: Were home health services ordered? no If so, what is the name of the agency? N/a  Has the agency set up a time to come to the patient's home? no Were any new equipment or medical supplies ordered?  No What is the name of the medical supply agency? N/a Were you able to get the supplies/equipment? no Do you have any questions related to the use of the equipment or supplies? No  Functional Questionnaire: (I = Independent and D = Dependent) ADLs: i  Bathing/Dressing- i  Meal Prep- i  Eating- i  Maintaining continence- i  Transferring/Ambulation- i  Managing Meds- i  Follow up appointments reviewed:  PCP Hospital f/u appt confirmed? No  Scheduled to see n/a on n/a @ n/a. [facility] f/u appt confirmed? No  Scheduled to see n/a on n/a @ n/a. Are transportation arrangements needed? No  If their condition worsens, is the pt aware to call PCP or go to the Emergency Dept.? Yes Was the patient provided with contact information for the PCP's office or ED? Yes Was to pt encouraged to call back with questions or concerns? Yes

## 2022-03-16 ENCOUNTER — Encounter: Payer: Self-pay | Admitting: Nurse Practitioner

## 2022-03-16 NOTE — Telephone Encounter (Signed)
Please advise 

## 2022-03-24 DIAGNOSIS — D2230 Melanocytic nevi of unspecified part of face: Secondary | ICD-10-CM | POA: Diagnosis not present

## 2022-03-24 DIAGNOSIS — L281 Prurigo nodularis: Secondary | ICD-10-CM | POA: Diagnosis not present

## 2022-03-24 DIAGNOSIS — L089 Local infection of the skin and subcutaneous tissue, unspecified: Secondary | ICD-10-CM | POA: Diagnosis not present

## 2022-03-24 DIAGNOSIS — D2239 Melanocytic nevi of other parts of face: Secondary | ICD-10-CM | POA: Diagnosis not present

## 2022-03-24 DIAGNOSIS — L439 Lichen planus, unspecified: Secondary | ICD-10-CM | POA: Diagnosis not present

## 2022-03-26 ENCOUNTER — Telehealth: Payer: Self-pay

## 2022-04-06 ENCOUNTER — Telehealth: Payer: Self-pay

## 2022-04-06 NOTE — Progress Notes (Addendum)
Chronic Care Management Pharmacy Assistant   Name: Robert Donaldson  MRN: [id number] DOB: [birth year]  Reason for Encounter: Chart review for CPP visit on 04-14-2022   Conditions to be addressed/monitored: HLD  Recent office visits:  01-21-2022 Minette Brine, [place]. Visit for tinnitus of both ears. Iron= 23, Iron Saturation= 8, ferrintin= 413. US Carotid Duplex Bilateral ordered.  10-07-2021 Minette Brine, [place]. Glucose= 121. A1C= 6.4. Cholesterol= 235, LDL= 154  Recent consult visits:  03-24-2022 Loney Loh (Dermatology). Unable to view encounter.  03-04-2022 [place] ([place]). Follow up visit.  02-15-2022 Edrick Kins, DPM (Podiatry). DG foot complete left/right performed.   Hospital visits:  Medication Reconciliation was completed by comparing discharge summary, patient's EMR and Pharmacy list, and upon discussion with patient.  Admitted to the hospital on 03-13-2022 due to a bee sting. Discharge date was 03-13-2022. Discharged from [place]?Medications Started at [facility] Discharge:?? MEDROL DOSEPAK 4 mg  Medication Changes at Hospital Discharge: None  Medications Discontinued at Hospital Discharge: None  Medications that remain the same after Hospital Discharge:??  -All other medications will remain the same.    Medications: Outpatient Encounter Medications as of 04/06/2022  Medication Sig   acyclovir ointment (ZOVIRAX) 5 % APPLY 1 APPLICATION TOPICALLY EVERY 3 HOURS   celecoxib (CELEBREX) 200 MG capsule Take 200 mg by mouth daily. Takes as needed   Clobetasol Prop Emollient Base 0.05 % emollient cream Apply topically 2 (two) times a day if needed (Rash).   cyclobenzaprine (FLEXERIL) 10 MG tablet Take 1 tablet (10 mg total) by mouth 3 (three) times daily as needed for muscle spasms.   diclofenac Sodium (VOLTAREN) 1 % GEL Apply 2 g topically 4 (four) times daily.   ferrous sulfate 325 (65 FE) MG EC tablet Take 1 tablet (325 mg  total) by mouth 2 (two) times daily.   meloxicam (MOBIC) 7.5 MG tablet Take by mouth. As needed   methylPREDNISolone (MEDROL DOSEPAK) 4 MG TBPK tablet Follow package insert   Nutritional Supplements (VITAMIN D BOOSTER PO) Take 1 capsule by mouth daily.   rosuvastatin (CRESTOR) 10 MG tablet Take 1 tablet (10 mg total) by mouth daily.   triamcinolone (KENALOG) 0.025 % ointment Apply 1 application topically 2 (two) times daily.   vitamin E 200 UNIT capsule Take 5,000 Units by mouth daily.    No facility-administered encounter medications on file as of 04/06/2022.   Lab Results  Component Value Date/Time   HGBA1C 6.4 03/04/2022 12:00 AM   HGBA1C 6.4 (H) 10/07/2021 10:38 AM   HGBA1C 6.0 (H) 06/08/2021 09:36 AM     BP Readings from Last 3 Encounters:  03/13/22 127/84  01/21/22 134/78  10/07/21 118/68    Have you seen any other providers since your last visit with PCP? Yes dermatology  What is your top health concern to discuss at your upcoming visit? Patient stated he is taking iron and needs to start a stool softener per Minette Brine and is unsure why he is taking iron.  Do you have any problems getting your medications from the pharmacy? No  Financial barriers? No Access barriers? No    Robert Donaldson was reminded to have all medications, supplements and any blood glucose and/or blood pressure readings available for review with Orlando Penner, Pharm. D, at his telephone visit on 04-14-2022 at 8:30.  04-13-2022: Patient confirmed appointment.  Care Gaps: Last annual wellness visit? 08-13-2021 Last Colonoscopy? 07-22-2014 Last Mammogram? None Last Bone Denisty? None  *if  applicable: Last eye exam / retinopathy screening? Patient stated 01-2022 Last diabetic foot exam? 02-15-2022  Immunizations: Covid booster overdue Flu vaccine overdue  Star Rating Drugs: Rosuvastatin 20 mg- Last filled 12-30-2021 [facility] (Attempted to Contact[facility])  [place] Clinical Pharmacist Assistant [phone]

## 2022-04-12 ENCOUNTER — Ambulatory Visit (INDEPENDENT_AMBULATORY_CARE_PROVIDER_SITE_OTHER): Payer: Medicare PPO | Admitting: Nurse Practitioner

## 2022-04-12 ENCOUNTER — Encounter: Payer: Self-pay | Admitting: Nurse Practitioner

## 2022-04-12 VITALS — BP 122/78 | HR 67 | Temp 99.1°F | Ht 68.0 in | Wt 175.0 lb

## 2022-04-12 DIAGNOSIS — E782 Mixed hyperlipidemia: Secondary | ICD-10-CM

## 2022-04-12 DIAGNOSIS — Z23 Encounter for immunization: Secondary | ICD-10-CM | POA: Diagnosis not present

## 2022-04-12 DIAGNOSIS — R7309 Other abnormal glucose: Secondary | ICD-10-CM | POA: Diagnosis not present

## 2022-04-12 DIAGNOSIS — G8929 Other chronic pain: Secondary | ICD-10-CM

## 2022-04-12 DIAGNOSIS — L439 Lichen planus, unspecified: Secondary | ICD-10-CM

## 2022-04-12 DIAGNOSIS — M545 Low back pain, unspecified: Secondary | ICD-10-CM

## 2022-04-12 DIAGNOSIS — D2230 Melanocytic nevi of unspecified part of face: Secondary | ICD-10-CM

## 2022-04-12 MED ORDER — HYDROXYCHLOROQUINE SULFATE 200 MG PO TABS: 200.0000 mg | ORAL_TABLET | Freq: Two times a day (BID) | ORAL | 0 refills | Status: AC

## 2022-04-12 NOTE — Patient Instructions (Addendum)
High Cholesterol  High cholesterol is a condition in which the blood has high levels of a white, waxy substance similar to fat (cholesterol). The liver makes all the cholesterol that the body needs. The human body needs small amounts of cholesterol to help build cells. A person gets extra or excess cholesterol from the food that he or she eats. The blood carries cholesterol from the liver to the rest of the body. If you have high cholesterol, deposits (plaques) may build up on the walls of your arteries. Arteries are the blood vessels that carry blood away from your heart. These plaques make the arteries narrow and stiff. Cholesterol plaques increase your risk for heart attack and stroke. Work with your health care provider to keep your cholesterol levels in a healthy range. What increases the risk? The following factors may make you more likely to develop this condition: Eating foods that are high in animal fat (saturated fat) or cholesterol. Being overweight. Not getting enough exercise. A family history of high cholesterol (familial hypercholesterolemia). Use of tobacco products. Having diabetes. What are the signs or symptoms? In most cases, high cholesterol does not usually cause any symptoms. In severe cases, very high cholesterol levels can cause: Fatty bumps under the skin (xanthomas). A white or gray ring around the black center (pupil) of the eye. How is this diagnosed? This condition may be diagnosed based on the results of a blood test. If you are older than 68 years of age, your health care provider may check your cholesterol levels every 4-6 years. You may be checked more often if you have high cholesterol or other risk factors for heart disease. The blood test for cholesterol measures: "Bad" cholesterol, or LDL cholesterol. This is the main type of cholesterol that causes heart disease. The desired level is less than 100 mg/dL (2.59 mmol/L). "Good" cholesterol, or HDL  cholesterol. HDL helps protect against heart disease by cleaning the arteries and carrying the LDL to the liver for processing. The desired level for HDL is 60 mg/dL (1.55 mmol/L) or higher. Triglycerides. These are fats that your body can store or burn for energy. The desired level is less than 150 mg/dL (1.69 mmol/L). Total cholesterol. This measures the total amount of cholesterol in your blood and includes LDL, HDL, and triglycerides. The desired level is less than 200 mg/dL (5.17 mmol/L). How is this treated? Treatment for high cholesterol starts with lifestyle changes, such as diet and exercise. Diet changes. You may be asked to eat foods that have more fiber and less saturated fats or added sugar. Lifestyle changes. These may include regular exercise, maintaining a healthy weight, and quitting use of tobacco products. Medicines. These are given when diet and lifestyle changes have not worked. You may be prescribed a statin medicine to help lower your cholesterol levels. Follow these instructions at home: Eating and drinking  Eat a healthy, balanced diet. This diet includes: Daily servings of a variety of fresh, frozen, or canned fruits and vegetables. Daily servings of whole grain foods that are rich in fiber. Foods that are low in saturated fats and trans fats. These include poultry and fish without skin, lean cuts of meat, and low-fat dairy products. A variety of fish, especially oily fish that contain omega-3 fatty acids. Aim to eat fish at least 2 times a week. Avoid foods and drinks that have added sugar. Use healthy cooking methods, such as roasting, grilling, broiling, baking, poaching, steaming, and stir-frying. Do not fry your food except for   stir-frying. If you drink alcohol: Limit how much you have to: 0-1 drink a day for women who are not pregnant. 0-2 drinks a day for men. Know how much alcohol is in a drink. In the [place] one drink equals one 12 oz bottle of beer (355 mL),  one 5 oz glass of wine (148 mL), or one 1 oz glass of hard liquor (44 mL). Lifestyle  Get regular exercise. Aim to exercise for a total of 150 minutes a week. Increase your activity level by doing activities such as gardening, walking, and taking the stairs. Do not use any products that contain nicotine or tobacco. These products include cigarettes, chewing tobacco, and vaping devices, such as e-cigarettes. If you need help quitting, ask your health care provider. General instructions Take over-the-counter and prescription medicines only as told by your health care provider. Keep all follow-up visits. This is important. Where to find more information American Heart Association: www.heart.org National Heart, Lung, and Blood Institute: https://wilson-eaton.com/ Contact a health care provider if: You have trouble achieving or maintaining a healthy diet or weight. You are starting an exercise program. You are unable to stop smoking. Get help right away if: You have chest pain. You have trouble breathing. You have discomfort or pain in your jaw, neck, back, shoulder, or arm. You have any symptoms of a stroke. "BE FAST" is an easy way to remember the main warning signs of a stroke: B - Balance. Signs are dizziness, sudden trouble walking, or loss of balance. E - Eyes. Signs are trouble seeing or a sudden change in vision. F - Face. Signs are sudden weakness or numbness of the face, or the face or eyelid drooping on one side. A - Arms. Signs are weakness or numbness in an arm. This happens suddenly and usually on one side of the body. S - Speech. Signs are sudden trouble speaking, slurred speech, or trouble understanding what people say. T - Time. Time to call emergency services. Write down what time symptoms started. You have other signs of a stroke, such as: A sudden, severe headache with no known cause. Nausea or vomiting. Seizure. These symptoms may represent a serious problem that is an  emergency. Do not wait to see if the symptoms will go away. Get medical help right away. Call your local emergency services (911 in the [place] Do not drive yourself to the hospital. Summary Cholesterol plaques increase your risk for heart attack and stroke. Work with your health care provider to keep your cholesterol levels in a healthy range. Eat a healthy, balanced diet, get regular exercise, and maintain a healthy weight. Do not use any products that contain nicotine or tobacco. These products include cigarettes, chewing tobacco, and vaping devices, such as e-cigarettes. Get help right away if you have any symptoms of a stroke. This information is not intended to replace advice given to you by your health care provider. Make sure you discuss any questions you have with your health care provider. Document Revised: 08/21/2020 Document Reviewed: 08/11/2020 Elsevier Patient Education  [place].    Influenza (Flu) Vaccine (Inactivated or Recombinant): What You Need to Know 1. Why get vaccinated? Influenza vaccine can prevent influenza (flu). Flu is a contagious disease that spreads around the [place] every year, usually between October and May. Anyone can get the flu, but it is more dangerous for some people. Infants and young children, people 37 years and older, pregnant people, and people with certain health conditions or a weakened  immune system are at greatest risk of flu complications. Pneumonia, bronchitis, sinus infections, and ear infections are examples of flu-related complications. If you have a medical condition, such as heart disease, cancer, or diabetes, flu can make it worse. Flu can cause fever and chills, sore throat, muscle aches, fatigue, cough, headache, and runny or stuffy nose. Some people may have vomiting and diarrhea, though this is more common in children than adults. In an average year, thousands of people in the [place] die from flu, and many more are  hospitalized. Flu vaccine prevents millions of illnesses and flu-related visits to the doctor each year. 2. Influenza vaccines CDC recommends everyone 6 months and older get vaccinated every flu season. Children 6 months through 62 years of age may need 2 doses during a single flu season. Everyone else needs only 1 dose each flu season. It takes about 2 weeks for protection to develop after vaccination. There are many flu viruses, and they are always changing. Each year a new flu vaccine is made to protect against the influenza viruses believed to be likely to cause disease in the upcoming flu season. Even when the vaccine doesn't exactly match these viruses, it may still provide some protection. Influenza vaccine does not cause flu. Influenza vaccine may be given at the same time as other vaccines. 3. Talk with your health care provider Tell your vaccination provider if the person getting the vaccine: Has had an allergic reaction after a previous dose of influenza vaccine, or has any severe, life-threatening allergies Has ever had Guillain-Barr Syndrome (also called "GBS") In some cases, your health care provider may decide to postpone influenza vaccination until a future visit. Influenza vaccine can be administered at any time during pregnancy. People who are or will be pregnant during influenza season should receive inactivated influenza vaccine. People with minor illnesses, such as a cold, may be vaccinated. People who are moderately or severely ill should usually wait until they recover before getting influenza vaccine. Your health care provider can give you more information. 4. Risks of a vaccine reaction Soreness, redness, and swelling where the shot is given, fever, muscle aches, and headache can happen after influenza vaccination. There may be a very small increased risk of Guillain-Barr Syndrome (GBS) after inactivated influenza vaccine (the flu shot). Young children who get the flu shot  along with pneumococcal vaccine (PCV13) and/or DTaP vaccine at the same time might be slightly more likely to have a seizure caused by fever. Tell your health care provider if a child who is getting flu vaccine has ever had a seizure. People sometimes faint after medical procedures, including vaccination. Tell your provider if you feel dizzy or have vision changes or ringing in the ears. As with any medicine, there is a very remote chance of a vaccine causing a severe allergic reaction, other serious injury, or death. 5. What if there is a serious problem? An allergic reaction could occur after the vaccinated person leaves the clinic. If you see signs of a severe allergic reaction (hives, swelling of the face and throat, difficulty breathing, a fast heartbeat, dizziness, or weakness), call 9-1-1 and get the person to the nearest hospital. For other signs that concern you, call your health care provider. Adverse reactions should be reported to the Vaccine Adverse Event Reporting System (VAERS). Your health care provider will usually file this report, or you can do it yourself. Visit the VAERS website at www.vaers.SamedayNews.es or call 412-690-6962. VAERS is only for reporting reactions, and  VAERS staff members do not give medical advice. 6. The National Vaccine Injury Compensation Program The [employer] Vaccine Injury Compensation Program (VICP) is a federal program that was created to compensate people who may have been injured by certain vaccines. Claims regarding alleged injury or death due to vaccination have a time limit for filing, which may be as short as two years. Visit the VICP website at GoldCloset.com.ee or call (786)641-9219 to learn about the program and about filing a claim. 7. How can I learn more? Ask your health care provider. Call your local or state health department. Visit the website of the Food and Drug Administration (FDA) for vaccine package inserts and additional  information at TraderRating.uy. Contact the Centers for Disease Control and Prevention (CDC): Call 775-047-3273 (1-800-CDC-INFO) or Visit CDC's website at https://gibson.com/. Source: CDC Vaccine Information Statement Inactivated Influenza Vaccine (01/25/2020) This same material is available at http://www.wolf.info/ for no charge. This information is not intended to replace advice given to you by your health care provider. Make sure you discuss any questions you have with your health care provider. Document Revised: 05/06/2021 Document Reviewed: 02/26/2021 Elsevier Patient Education  [place].

## 2022-04-12 NOTE — Progress Notes (Signed)
I,Robert Donaldson,acting as a Education administrator for [employer], FNP.,have documented all relevant documentation on the behalf of Robert Brine, FNP,as directed by  Robert Brine, FNP while in the presence of Robert Donaldson, Robert Donaldson.  Subjective:     Patient ID: Robert Donaldson , male    DOB: April 09, 1956 , 66 y.o.   MRN: [id number]   Chief Complaint  Patient presents with   Hyperlipidemia    HPI  Patient presents today for cholesterol and prediabetes follow up. He had his labs done at the [place] last month and his HgbA1c remains 6.4.  He has cut back on alcohol, cakes/sweets. He is exercising daily at least 5 days a week.   He is taking tamsulosin to help with his urinary stream. His PSA was good. He has had improvement. He does not have a f/u with Urology.   Hyperlipidemia This is a chronic problem. The current episode started more than 1 year ago. The problem is controlled. Recent lipid tests were reviewed and are normal. He has no history of chronic renal disease. Pertinent negatives include no chest pain. Current antihyperlipidemic treatment includes exercise and diet change. The current treatment provides no improvement of lipids. There are no compliance problems.  Risk factors for coronary artery disease include a sedentary lifestyle and dyslipidemia.    Past Medical History:  Diagnosis Date   HLD (hyperlipidemia)    Vitamin D deficiency      Family History  Problem Relation Age of Onset   Hyperlipidemia Sister      Current Outpatient Medications:    hydroxychloroquine (PLAQUENIL) 200 MG tablet, Take 1 tablet (200 mg total) by mouth 2 (two) times daily., Disp: 60 tablet, Rfl: 0   celecoxib (CELEBREX) 200 MG capsule, Take 200 mg by mouth daily. Takes as needed, Disp: , Rfl:    Cholecalciferol 125 MCG (5000 UT) TABS, Take 125 mcg by mouth daily. Taking 1 tablet by mouth daily., Disp: , Rfl:    Clobetasol Prop Emollient Base 0.05 % emollient cream, Apply topically 2 (two) times a day if needed  (Rash)., Disp: 45 g, Rfl: 4   cyclobenzaprine (FLEXERIL) 10 MG tablet, Take 1 tablet (10 mg total) by mouth 3 (three) times daily as needed for muscle spasms., Disp: 30 tablet, Rfl: 0   diclofenac Sodium (VOLTAREN) 1 % GEL, Apply 2 g topically 4 (four) times daily., Disp: 100 g, Rfl: 2   ferrous sulfate 325 (65 FE) MG EC tablet, Take 1 tablet (325 mg total) by mouth 2 (two) times daily., Disp: 180 tablet, Rfl: 1   meloxicam (MOBIC) 7.5 MG tablet, Take by mouth. As needed, Disp: , Rfl:    rosuvastatin (CRESTOR) 10 MG tablet, Take 1 tablet (10 mg total) by mouth daily., Disp: 90 tablet, Rfl: 1   tamsulosin (FLOMAX) 0.4 MG CAPS capsule, Take 0.4 mg by mouth at bedtime., Disp: , Rfl:    vitamin E 200 UNIT capsule, Take 5,000 Units by mouth daily. , Disp: , Rfl:    No Known Allergies   Review of Systems  Constitutional: Negative.   Respiratory: Negative.    Cardiovascular: Negative.  Negative for chest pain.  Gastrointestinal: Negative.   Neurological: Negative.   Psychiatric/Behavioral: Negative.       Today's Vitals   04/12/22 0834  BP: 122/78  Pulse: 67  Temp: 99.1 F (37.3 C)  TempSrc: Oral  Weight: 175 lb (79.4 kg)  Height: '5\' 8"'$  (1.727 m)   Body mass index is 26.61 kg/m.  Wt Readings from  Last 3 Encounters:  04/12/22 175 lb (79.4 kg)  01/21/22 183 lb (83 kg)  10/07/21 184 lb (83.5 kg)    Objective:  Physical Exam Vitals reviewed.  Constitutional:      Appearance: Normal appearance.  Cardiovascular:     Rate and Rhythm: Normal rate and regular rhythm.     Pulses: Normal pulses.     Heart sounds: Normal heart sounds. No murmur heard. Pulmonary:     Effort: Pulmonary effort is normal. No respiratory distress.     Breath sounds: Normal breath sounds. No wheezing.  Skin:    General: Skin is warm and dry.     Capillary Refill: Capillary refill takes less than 2 seconds.  Neurological:     General: No focal deficit present.     Mental Status: He is alert and oriented  to person, place, and time.     Cranial Nerves: No cranial nerve deficit.     Motor: No weakness.  Psychiatric:        Mood and Affect: Mood normal.        Behavior: Behavior normal.        Thought Content: Thought content normal.        Judgment: Judgment normal.         Assessment And Plan:     1. Mixed hyperlipidemia Comments: Cholesterol levels are normal. Continue current medications, tolerating well.   2. Abnormal glucose Comments: HgbA1c is stable at 6.4 he has had an 8 lb weight loss since his last visit will check levels at next visit.   3. Chronic midline low back pain without sciatica Comments: Reported to have a history of degenerative disc disease dx at [facility].  - Ambulatory referral to Physical Therapy  4. Need for influenza vaccination Influenza vaccine administered Encouraged to take Tylenol as needed for fever or muscle aches. - Flu Vaccine QUAD High Dose(Fluad)  5. Lichen planus He has had a second opinion by Dr. Mariel Kansky who has started him on hydroxychloroquine. He is advised again to see his ophthalmologist at least once a year.  - hydroxychloroquine (PLAQUENIL) 200 MG tablet; Take 1 tablet (200 mg total) by mouth 2 (two) times daily.  Dispense: 60 tablet; Refill: 0  6. [place] Per Dr. Liana Crocker notes she recommended a consultation with Dr. Jocelyn Lamer in [place].   Patient was given opportunity to ask questions. Patient verbalized understanding of the plan and was able to repeat key elements of the plan. All questions were answered to their satisfaction.  Robert Brine, FNP   I, Robert Brine, FNP, have reviewed all documentation for this visit. The documentation on 04/12/22 for the exam, diagnosis, procedures, and orders are all accurate and complete.   IF YOU HAVE BEEN REFERRED TO A SPECIALIST, IT MAY TAKE 1-2 WEEKS TO SCHEDULE/PROCESS THE REFERRAL. IF YOU HAVE NOT HEARD FROM US/SPECIALIST IN TWO WEEKS, PLEASE GIVE [place] A CALL AT [phone] X [phone].    THE PATIENT IS ENCOURAGED TO PRACTICE SOCIAL DISTANCING DUE TO THE COVID-19 PANDEMIC.

## 2022-04-14 ENCOUNTER — Ambulatory Visit (INDEPENDENT_AMBULATORY_CARE_PROVIDER_SITE_OTHER): Payer: Medicare PPO

## 2022-04-14 DIAGNOSIS — E782 Mixed hyperlipidemia: Secondary | ICD-10-CM

## 2022-04-14 DIAGNOSIS — D508 Other iron deficiency anemias: Secondary | ICD-10-CM

## 2022-04-14 NOTE — Progress Notes (Signed)
Chronic Care Management Pharmacy Note  04/20/2022 Name:  Robert Donaldson MRN:  [id number] DOB:  March 07, 1956  Summary: He reports that he is doing well, and has recently increased his rosuvastatin to 10 mg tablet once per day  Recommendations/Changes made from today's visit: Recommend adding more foods that are iron rich foods  Recommend patient receive COVID-19 booster  Plan: He is going to get the COVID-19 Booster Maintain a healthy weight and remain lean    Subjective: Robert Donaldson is an 66 y.o. year old male who is a primary patient of Minette Brine, [place].  The CCM team was consulted for assistance with disease management and care coordination needs.    Engaged with patient by telephone for initial visit in response to provider referral for pharmacy case management and/or care coordination services. He has been in [place] in 20 years and originally from [place], he is originally from [place], [place]. He graduated from [employer] 45 years ago and he is retired Nature conservation officer. He retired from [employer], he was an Transport planner.  He has one son who has been in the [employer] for 19 years. 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 He has been married for 23 years. 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    Consent to Services:  The patient was given the following information about Chronic Care Management services today, agreed to services, and gave verbal consent: 1. CCM service includes personalized support from designated clinical staff supervised by the primary care provider, including individualized plan of care and coordination with other care providers 2. 24/7 contact phone numbers for assistance for urgent and routine care needs. 3. Service will only be billed when office clinical staff spend 20 minutes or more in a month to coordinate care. 4. Only one practitioner may furnish and bill the service in a calendar month. 5.The patient may stop  CCM services at any time (effective at the end of the month) by phone call to the office staff. 6. The patient will be responsible for cost sharing (co-pay) of up to 20% of the service fee (after annual deductible is met). Patient agreed to services and consent obtained.  Patient Care Team: Minette Brine, FNP as PCP - General ([place]) Lavonna Monarch, MD (Inactive) as Consulting Physician (Dermatology) Mayford Knife, [facility] (Pharmacist)  Recent office visits:  01-21-2022 Minette Brine, [place]. Visit for tinnitus of both ears. Iron= 23, Iron Saturation= 8, ferrintin= 413. US Carotid Duplex Bilateral ordered.   10-07-2021 Minette Brine, [place]. Glucose= 121. A1C= 6.4. Cholesterol= 235, LDL= 154   Recent consult visits:  03-24-2022 Loney Loh (Dermatology). Unable to view encounter.   03-04-2022 [place] ([place]). Follow up visit.   02-15-2022 Edrick Kins, DPM (Podiatry). DG foot complete left/right performed.    Hospital visits:  Medication Reconciliation was completed by comparing discharge summary, patient's EMR and Pharmacy list, and upon discussion with patient.   Admitted to the hospital on 03-13-2022 due to a bee sting. Discharge date was 03-13-2022. Discharged from [place]?Medications Started at [facility] Discharge:?? MEDROL DOSEPAK 4 mg   Medication Changes at Hospital Discharge: None   Medications Discontinued at Hospital Discharge: None   Medications that remain the same after Hospital Discharge:??  -All other medications will remain the same.     Objective:  Lab Results  Component Value Date   CREATININE 1.22 10/07/2021   BUN 13 10/07/2021  EGFR 65 10/07/2021   GFRNONAA 63 08/06/2020   GFRAA 73 08/06/2020   NA 139 10/07/2021   K 4.1 10/07/2021   CALCIUM 9.3 10/07/2021   CO2 21 10/07/2021   GLUCOSE 121 (H) 10/07/2021    Lab Results  Component Value Date/Time   HGBA1C 6.4 03/04/2022 12:00 AM   HGBA1C 6.4  (H) 10/07/2021 10:38 AM   HGBA1C 6.0 (H) 06/08/2021 09:36 AM    Last diabetic Eye exam: No results found for: "HMDIABEYEEXA"  Last diabetic Foot exam: No results found for: "HMDIABFOOTEX"   Lab Results  Component Value Date   CHOL 164 03/04/2022   HDL 60 03/04/2022   LDLCALC 86 03/04/2022   TRIG 88 03/04/2022   CHOLHDL 4.1 10/07/2021       Latest Ref Rng & Units 10/07/2021   10:38 AM 12/24/2020    2:31 PM 08/06/2020   11:40 AM  Hepatic Function  Total Protein 6.0 - 8.5 g/dL 6.7  7.3  7.1   Albumin 3.8 - 4.8 g/dL 4.5   4.6   AST 0 - 40 IU/L _0 ALT 0 - 44 IU/L 24  35  32   Alk Phosphatase 44 - 121 IU/L 65   59   Total Bilirubin 0.0 - 1.2 mg/dL 0.5  0.6  0.5     Lab Results  Component Value Date/Time   TSH 1.030 01/21/2022 03:53 PM   TSH 1.710 12/10/2019 04:58 PM       Latest Ref Rng & Units 10/07/2021   10:38 AM 12/24/2020    2:31 PM 12/10/2019    4:58 PM  CBC  WBC 3.4 - 10.8 x10E3/uL 6.0  8.9  8.1   Hemoglobin 13.0 - 17.7 g/dL 14.4  15.3  15.3   Hematocrit 37.5 - 51.0 % 41.9  45.5  45.2   Platelets 150 - 450 x10E3/uL 231  238  225     Lab Results  Component Value Date/Time   VD25OH 49.6 12/10/2019 04:58 PM    Clinical ASCVD: No  The 10-year ASCVD risk score (Arnett DK, et al., 2019) is: 9.1%*   Values used to calculate the score:     Age: 50 years     Sex: Male     Is Non-Hispanic African American: Yes     Diabetic: No     Tobacco smoker: No     Systolic Blood Pressure: 366 mmHg     Is BP treated: No     HDL Cholesterol: 60 mg/dL*     Total Cholesterol: 164 mg/dL*     * - Cholesterol units were assumed for this score calculation       04/12/2022    8:22 AM 01/21/2022    3:03 PM 08/13/2021    9:11 AM  Depression screen PHQ 2/9  Decreased Interest 0 0 0  Down, Depressed, Hopeless 0 0 0  PHQ - 2 Score 0 0 0       Social History   Tobacco Use  Smoking Status Former   Types: Cigars   Start date: 06/21/1989   Quit date: 03/26/2013   Years  since quitting: 9.0  Smokeless Tobacco Never   BP Readings from Last 3 Encounters:  04/12/22 122/78  03/13/22 127/84  01/21/22 134/78   Pulse Readings from Last 3 Encounters:  04/12/22 67  03/13/22 (!) 53  01/21/22 87   Wt Readings from Last 3 Encounters:  04/12/22 175 lb (79.4 kg)  01/21/22 183 lb (  83 kg)  10/07/21 184 lb (83.5 kg)   BMI Readings from Last 3 Encounters:  04/12/22 26.61 kg/m  01/21/22 27.83 kg/m  10/07/21 27.98 kg/m    Assessment/Interventions: Review of patient past medical history, allergies, medications, health status, including review of consultants reports, laboratory and other test data, was performed as part of comprehensive evaluation and provision of chronic care management services.   SDOH:  (Social Determinants of Health) assessments and interventions performed: Yes SDOH Interventions    Flowsheet Row Chronic Care Management from 04/14/2022 in [facility] Clinical Support from 08/01/2019 in [facility]  SDOH Interventions    Alcohol Usage Interventions Intervention Not Indicated (Score <7) --  Depression Interventions/Treatment  -- PJA2-5 Score <4 Follow-up Not Indicated      SDOH Screenings   Food Insecurity: No Food Insecurity (08/13/2021)  Housing: Low Risk  (04/14/2022)  Transportation Needs: No Transportation Needs (08/13/2021)  Alcohol Screen: Low Risk  (04/14/2022)  Depression (PHQ2-9): Low Risk  (04/12/2022)  Financial Resource Strain: Low Risk  (08/13/2021)  Physical Activity: Sufficiently Active (08/13/2021)  Stress: No Stress Concern Present (08/13/2021)  Tobacco Use: Medium Risk (04/20/2022)    [place]  No Known Allergies  Medications Reviewed Today     Reviewed by Mayford Knife, [place] (Pharmacist) on 04/14/22 at [place] List Status: <None>   Medication Order Taking? Sig Documenting Provider Last Dose Status Informant  celecoxib (CELEBREX) 200 MG capsule [id number] Yes  Take 200 mg by mouth daily. Takes as needed [provider]  Active Self  Cholecalciferol 125 MCG (5000 UT) TABS [id number] Yes Take 125 mcg by mouth daily. Taking 1 tablet by mouth daily. [provider]  Active   Clobetasol Prop Emollient Base 0.05 % emollient cream [id number]  Apply topically 2 (two) times a day if needed (Rash). Lavonna Monarch, MD  Active   cyclobenzaprine (FLEXERIL) 10 MG tablet [id number]  Take 1 tablet (10 mg total) by mouth 3 (three) times daily as needed for muscle spasms. Minette Brine, FNP  Active   diclofenac Sodium (VOLTAREN) 1 % GEL [id number]  Apply 2 g topically 4 (four) times daily. Minette Brine, FNP  Active   ferrous sulfate 325 (65 FE) MG EC tablet [id number]  Take 1 tablet (325 mg total) by mouth 2 (two) times daily. Minette Brine, FNP  Active   hydroxychloroquine (PLAQUENIL) 200 MG tablet [id number]  Take 1 tablet (200 mg total) by mouth 2 (two) times daily. Minette Brine, FNP  Active   meloxicam [facility]) 7.5 MG tablet [id number]  Take by mouth. As needed [provider]  Active Self  rosuvastatin (CRESTOR) 10 MG tablet [id number]  Take 1 tablet (10 mg total) by mouth daily. Minette Brine, FNP  Active   vitamin E 200 UNIT capsule [id number]  Take 5,000 Units by mouth daily.  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   Lump of skin 01/25/2019   Tenosynovitis 12/27/2018   Abnormal glucose 07/26/2018   Left wrist pain 07/26/2018   Hyperlipidemia 07/26/2018   Nocturia 07/26/2018    Immunization History  Administered Date(s) Administered   Fluad Quad(high Dose 65+) 04/12/2022   Hepatitis A, Adult 05/03/1995, 05/13/1996   Influenza Whole 05/03/1995, 05/13/1996, 03/29/1997   Influenza, High Dose Seasonal PF 05/22/2019  Influenza-Unspecified 04/01/2009, 06/05/2010, 05/03/2012,  05/03/2013, 05/03/2014   OPV 04/21/1978   PFIZER(Purple Top)SARS-COV-2 Vaccination 07/16/2019, 08/06/2019, 04/14/2020   PNEUMOCOCCAL CONJUGATE-20 08/13/2021   Pfizer Covid-19 Vaccine Bivalent Booster 89yr & up 03/31/2021   Pneumococcal Polysaccharide-23 07/26/2018   Smallpox 01/20/1980   Td 11/19/1988   Tdap 04/01/2009, 04/06/2015   Typhoid Parenteral, [place] ([place] 07/22/1988   Yellow Fever 01/19/1977   Zoster Recombinat (Shingrix) 12/20/2019, 03/04/2020    Conditions to be addressed/monitored:  Hyperlipidemia and Iron Deficiency Anemia  Care Plan : [place] Updates made by PMayford Knife RSimi [place] 04/20/2022 12:00 AM     Problem: HLD, Iron Deficiency Anemia      Long-Range Goal: Disease Management   Note:   Current Barriers:  Unable to independently afford treatment regimen Unable to independently monitor therapeutic efficacy  Pharmacist Clinical Goal(s):  Patient will achieve adherence to monitoring guidelines and medication adherence to achieve therapeutic efficacy through collaboration with PharmD and provider.   Interventions: 1:1 collaboration with MMinette Brine FNP regarding development and update of comprehensive plan of care as evidenced by provider attestation and co-signature Inter-disciplinary care team collaboration (see longitudinal plan of care) Comprehensive medication review performed; medication list updated in electronic medical record  Hyperlipidemia: (LDL goal < 100) -Controlled -Current treatment: Rosuvastatin 10 mg tablet taking 1 tablet once per day Appropriate, Effective, Safe, Accessible  -Medications previously tried: Rosuvastatin 5 mg tablet   -Current dietary patterns: He does not eat a lot of meat, and he rarely eats pizza. He only meats about once per week.  -Current exercise habits: he has been going to the exercise program 5 days per week and some days he plays golf. He exercises for a couple hours daily.  -He  does low intensity, high impact weight training  -Educated on Cholesterol goals;  Benefits of statin for ASCVD risk reduction; Importance of limiting foods high in cholesterol; -Recommended patient continue current medication regimen   Iron Deficiency Anemia (Goal: 24 to 336 ng/ml) -Uncontrolled -Current treatment  Ferrous sulfate 325 (65 FE) mg EC tablet - taking one tablet twice per day  Appropriate, Query effective  Patient reports that he was taking it twice per day for a week until he started to not feel well  - choose iron-rich foods such as beans, dried fruits, eggs, lean red meat, salmon, iron-fortified breads and cereals, peas, tofu, and dark green leafy vegetables.  -Collaborated with PCP to discuss other iron supplement options, and confirm order placement for 3 month retesting of patient from the 01/21/2022 initial date     Previous Tobacco use  -Controlled -Mr. TDannelonly smoked cigarettes for 4 years and a 1/2 pack per day while in college -He occasionally had a cigar daily from 1993 until the 2000's but he stopped smoking cigars all together as well.  -More pack years correlates with higher lung disease risk, including lung cancer; consider screening with low-dose CT in patients ?55 years with ?30 pack year history; counsel patients on smoking cessation -We discussed in detail how many cigars he had per week, occasionally it would be 3 cigars per week, he only smoked from 2005-2014.  His pack year history came out to only 9 years      Health Maintenance -Vaccine gaps: COVID-19 Booster Vaccine -Educated on Herbal supplement research is limited and benefits usually cannot be proven -Patient is satisfied with current therapy and denies issues -Recommended to continue current medication  Patient Goals/Self-Care Activities Patient will:  -  take medications as prescribed as evidenced by patient report and record review  Follow Up Plan: The patient has been provided with  contact information for the care management team and has been advised to call with any health related questions or concerns.       Medication Assistance: None required.  Patient affirms current coverage meets needs.  Compliance/Adherence/Medication fill history: Care Gaps: COVID-19 Booster  Star-Rating Drugs: Rosuvastatin 10 mg tablet   Patient's preferred pharmacy is:  [facility], [place] [address] [place] [address] Phone: [phone] Fax: [place] [address] AT [address] [place] [address] Phone: [phone] Fax: [phone]  Uses pill box? Yes Pt endorses 95% compliance  We discussed: Current pharmacy is preferred with insurance plan and patient is satisfied with pharmacy services Patient decided to: Continue current medication management strategy  Care Plan and Follow Up Patient Decision:  Patient agrees to Care Plan and Follow-up.  Plan: The patient has been provided with contact information for the care management team and has been advised to call with any health related questions or concerns.   Orlando Penner, CPP, PharmD Clinical Pharmacist Practitioner [facility] [phone]

## 2022-04-14 NOTE — Patient Instructions (Addendum)
Visit Information It was great speaking with you today!  Please let me know if you have any questions about our visit.   Goals Addressed             This Visit's Progress    Manage My Medicine       Timeframe:  Long-Range Goal Priority:  High Start Date:                             Expected End Date:                       Follow Up Date 06/2021    - call for medicine refill 2 or 3 days before it runs out - call if I am sick and can't take my medicine - keep a list of all the medicines I take; vitamins and herbals too - learn to read medicine labels    Why is this important?   These steps will help you keep on track with your medicines.           Patient Care Plan: CCM Pharmacy Care Plan     Problem Identified: HLD, Iron Deficiency Anemia      Long-Range Goal: Disease Management   Note:   Current Barriers:  Unable to independently afford treatment regimen Unable to independently monitor therapeutic efficacy  Pharmacist Clinical Goal(s):  Patient will achieve adherence to monitoring guidelines and medication adherence to achieve therapeutic efficacy through collaboration with PharmD and provider.   Interventions: 1:1 collaboration with Minette Brine, FNP regarding development and update of comprehensive plan of care as evidenced by provider attestation and co-signature Inter-disciplinary care team collaboration (see longitudinal plan of care) Comprehensive medication review performed; medication list updated in electronic medical record  Hyperlipidemia: (LDL goal < 100) -Controlled -Current treatment: Rosuvastatin 10 mg tablet taking 1 tablet once per day Appropriate, Effective, Safe, Accessible  -Medications previously tried: Rosuvastatin 5 mg tablet   -Current dietary patterns: He does not eat a lot of meat, and he rarely eats pizza. He only meats about once per week.  -Current exercise habits: he has been going to the exercise program 5 days per week and  some days he plays golf. He exercises for a couple hours daily.  -He does low intensity, high impact weight training  -Educated on Cholesterol goals;  Benefits of statin for ASCVD risk reduction; Importance of limiting foods high in cholesterol; -Recommended patient continue current medication regimen   Iron Deficiency Anemia (Goal: 24 to 336 ng/ml) -Uncontrolled -Current treatment  Ferrous sulfate 325 (65 FE) mg EC tablet - taking one tablet twice per day  Appropriate, Query effective  Patient reports that he was taking it twice per day for a week until he started to not feel well  - choose iron-rich foods such as beans, dried fruits, eggs, lean red meat, salmon, iron-fortified breads and cereals, peas, tofu, and dark green leafy vegetables.  -Collaborated with PCP to discuss other iron supplement options, and confirm order placement for 3 month retesting of patient from the 01/21/2022 initial date     Previous Tobacco use  -Controlled -Mr. Carlas only smoked cigarettes for 4 years and a 1/2 pack per day while in college -He occasionally had a cigar daily from 1993 until the 2000's but he stopped smoking cigars all together as well.  -More pack years correlates with higher lung disease risk, including lung cancer; consider screening with  low-dose CT in patients ?55 years with ?30 pack year history; counsel patients on smoking cessation -We discussed in detail how many cigars he had per week, occasionally it would be 3 cigars per week, he only smoked from 2005-2014.  His pack year history came out to  9 years      Health Maintenance -Vaccine gaps: COVID-19 Booster Vaccine -Educated on Herbal supplement research is limited and benefits usually cannot be proven -Patient is satisfied with current therapy and denies issues -Recommended to continue current medication  Patient Goals/Self-Care Activities Patient will:  - take medications as prescribed as evidenced by patient report and  record review  Follow Up Plan: The patient has been provided with contact information for the care management team and has been advised to call with any health related questions or concerns.       Mr. Covelli was given information about Chronic Care Management services today including:  CCM service includes personalized support from designated clinical staff supervised by his physician, including individualized plan of care and coordination with other care providers 24/7 contact phone numbers for assistance for urgent and routine care needs. Standard insurance, coinsurance, copays and deductibles apply for chronic care management only during months in which we provide at least 20 minutes of these services. Most insurances cover these services at 100%, however patients may be responsible for any copay, coinsurance and/or deductible if applicable. This service may help you avoid the need for more expensive face-to-face services. Only one practitioner may furnish and bill the service in a calendar month. The patient may stop CCM services at any time (effective at the end of the month) by phone call to the office staff.  Patient agreed to services and verbal consent obtained.   The patient verbalized understanding of instructions, educational materials, and care plan provided today and agreed to receive a mailed copy of patient instructions, educational materials, and care plan.   Orlando Penner, PharmD Clinical Pharmacist Practitioner  [facility] [phone]

## 2022-04-20 DIAGNOSIS — E782 Mixed hyperlipidemia: Secondary | ICD-10-CM | POA: Diagnosis not present

## 2022-04-20 DIAGNOSIS — D509 Iron deficiency anemia, unspecified: Secondary | ICD-10-CM | POA: Diagnosis not present

## 2022-04-22 DIAGNOSIS — M5459 Other low back pain: Secondary | ICD-10-CM | POA: Diagnosis not present

## 2022-04-26 ENCOUNTER — Encounter: Payer: Self-pay | Admitting: Nurse Practitioner

## 2022-04-27 DIAGNOSIS — M5459 Other low back pain: Secondary | ICD-10-CM | POA: Diagnosis not present

## 2022-04-29 DIAGNOSIS — M5459 Other low back pain: Secondary | ICD-10-CM | POA: Diagnosis not present

## 2022-05-04 DIAGNOSIS — M5459 Other low back pain: Secondary | ICD-10-CM | POA: Diagnosis not present

## 2022-05-06 DIAGNOSIS — M5459 Other low back pain: Secondary | ICD-10-CM | POA: Diagnosis not present

## 2022-05-10 ENCOUNTER — Encounter: Payer: Self-pay | Admitting: Nurse Practitioner

## 2022-05-10 ENCOUNTER — Ambulatory Visit (INDEPENDENT_AMBULATORY_CARE_PROVIDER_SITE_OTHER): Payer: Medicare PPO | Admitting: Nurse Practitioner

## 2022-05-10 VITALS — BP 132/72 | HR 71 | Temp 98.2°F | Ht 68.0 in | Wt 178.0 lb

## 2022-05-10 DIAGNOSIS — M5459 Other low back pain: Secondary | ICD-10-CM | POA: Diagnosis not present

## 2022-05-10 DIAGNOSIS — R1084 Generalized abdominal pain: Secondary | ICD-10-CM | POA: Diagnosis not present

## 2022-05-10 DIAGNOSIS — D508 Other iron deficiency anemias: Secondary | ICD-10-CM | POA: Diagnosis not present

## 2022-05-10 NOTE — Progress Notes (Signed)
I,Tianna Badgett,acting as a Education administrator for [employer], FNP.,have documented all relevant documentation on the behalf of Minette Brine, FNP,as directed by  Minette Brine, FNP while in the presence of Minette Brine, [place].   Subjective:     Patient ID: Robert Donaldson , male    DOB: 04-28-1956 , 66 y.o.   MRN: [id number]   Chief Complaint  Patient presents with   Abdominal Pain    HPI  Here for abdomen pain. Today his abdomen pain is not as bad. He continues with taking hydrochloroquine has been on for about one month. Would take on empty stomach. He is also having pain to his stomach after taking iron supplement. He would have nausea feeling and then would have loose bowels. He took a stool softener once. Will also hear sounds to his abdomen.    He is taking a gummy fiber tabs was up to 9 tabs a day.   Abdominal Pain This is a new problem. The current episode started 1 to 4 weeks ago. The onset quality is sudden. Pertinent negatives include no anorexia, diarrhea, fever or nausea. The pain is relieved by Nothing. He has tried nothing for the symptoms. There is no history of colon cancer.     Past Medical History:  Diagnosis Date   HLD (hyperlipidemia)    Vitamin D deficiency      Family History  Problem Relation Age of Onset   Hyperlipidemia Sister      Current Outpatient Medications:    celecoxib (CELEBREX) 200 MG capsule, Take 200 mg by mouth daily. Takes as needed, Disp: , Rfl:    Cholecalciferol 125 MCG (5000 UT) TABS, Take 125 mcg by mouth daily. Taking 1 tablet by mouth daily., Disp: , Rfl:    Clobetasol Prop Emollient Base 0.05 % emollient cream, Apply topically 2 (two) times a day if needed (Rash)., Disp: 45 g, Rfl: 4   cyclobenzaprine (FLEXERIL) 10 MG tablet, Take 1 tablet (10 mg total) by mouth 3 (three) times daily as needed for muscle spasms., Disp: 30 tablet, Rfl: 0   diclofenac Sodium (VOLTAREN) 1 % GEL, Apply 2 g topically 4 (four) times daily., Disp: 100 g, Rfl: 2    ferrous sulfate 325 (65 FE) MG EC tablet, Take 1 tablet (325 mg total) by mouth 2 (two) times daily., Disp: 180 tablet, Rfl: 1   hydroxychloroquine (PLAQUENIL) 200 MG tablet, Take 1 tablet (200 mg total) by mouth 2 (two) times daily., Disp: 60 tablet, Rfl: 0   meloxicam (MOBIC) 7.5 MG tablet, Take by mouth. As needed, Disp: , Rfl:    rosuvastatin (CRESTOR) 10 MG tablet, Take 1 tablet (10 mg total) by mouth daily., Disp: 90 tablet, Rfl: 1   tamsulosin (FLOMAX) 0.4 MG CAPS capsule, Take 0.4 mg by mouth at bedtime., Disp: , Rfl:    vitamin E 200 UNIT capsule, Take 5,000 Units by mouth daily. , Disp: , Rfl:    No Known Allergies   Review of Systems  Constitutional: Negative.  Negative for fever.  Respiratory: Negative.    Cardiovascular: Negative.  Negative for chest pain, palpitations and leg swelling.  Gastrointestinal:  Positive for abdominal pain. Negative for abdominal distention, anorexia, diarrhea and nausea.  Neurological: Negative.   Psychiatric/Behavioral: Negative.       Today's Vitals   05/10/22 1412  BP: 132/72  Pulse: 71  Temp: 98.2 F (36.8 C)  TempSrc: Oral  Weight: 178 lb (80.7 kg)  Height: '5\' 8"'$  (1.727 m)   Body mass  index is 27.06 kg/m.  BP Readings from Last 3 Encounters:  05/10/22 132/72  04/12/22 122/78  03/13/22 127/84    Objective:  Physical Exam Vitals reviewed.  Constitutional:      General: He is not in acute distress.    Appearance: He is well-developed.  Abdominal:     General: Bowel sounds are normal.     Tenderness: There is no abdominal tenderness.  Skin:    General: Skin is warm and dry.     Capillary Refill: Capillary refill takes less than 2 seconds.  Neurological:     General: No focal deficit present.     Mental Status: He is alert.  Psychiatric:        Mood and Affect: Mood normal.        Behavior: Behavior normal.         Assessment And Plan:     1. Generalized abdominal pain Comments: No abnormal findings on physical  exam. I have advised to cut back on his fiber since having loose bowels.  2. Iron deficiency anemia secondary to inadequate dietary iron intake Comments: Will recheck iron studies if normal will decrease iron to once a day or every other day. - Iron, TIBC and Ferritin Panel     Patient was given opportunity to ask questions. Patient verbalized understanding of the plan and was able to repeat key elements of the plan. All questions were answered to their satisfaction.  Minette Brine, FNP   I, Minette Brine, FNP, have reviewed all documentation for this visit. The documentation on 05/10/22 for the exam, diagnosis, procedures, and orders are all accurate and complete.   IF YOU HAVE BEEN REFERRED TO A SPECIALIST, IT MAY TAKE 1-2 WEEKS TO SCHEDULE/PROCESS THE REFERRAL. IF YOU HAVE NOT HEARD FROM US/SPECIALIST IN TWO WEEKS, PLEASE GIVE [place] A CALL AT [phone].   THE PATIENT IS ENCOURAGED TO PRACTICE SOCIAL DISTANCING DUE TO THE COVID-19 PANDEMIC.

## 2022-05-10 NOTE — Patient Instructions (Signed)
Abdominal Pain, Adult Many things can cause belly (abdominal) pain. Most times, belly pain is not dangerous. Many cases of belly pain can be watched and treated at home. Sometimes, though, belly pain is serious. Your doctor will try to find the cause of your belly pain. Follow these instructions at home:  Medicines Take over-the-counter and prescription medicines only as told by your doctor. Do not take medicines that help you poop (laxatives) unless told by your doctor. General instructions Watch your belly pain for any changes. Drink enough fluid to keep your pee (urine) pale yellow. Keep all follow-up visits as told by your doctor. This is important. Contact a doctor if: Your belly pain changes or gets worse. You are not hungry, or you lose weight without trying. You are having trouble pooping (constipated) or have watery poop (diarrhea) for more than 2-3 days. You have pain when you pee or poop. Your belly pain wakes you up at night. Your pain gets worse with meals, after eating, or with certain foods. You are vomiting and cannot keep anything down. You have a fever. You have blood in your pee. Get help right away if: Your pain does not go away as soon as your doctor says it should. You cannot stop vomiting. Your pain is only in areas of your belly, such as the right side or the left lower part of the belly. You have bloody or black poop, or poop that looks like tar. You have very bad pain, cramping, or bloating in your belly. You have signs of not having enough fluid or water in your body (dehydration), such as: Dark pee, very little pee, or no pee. Cracked lips. Dry mouth. Sunken eyes. Sleepiness. Weakness. You have trouble breathing or chest pain. Summary Many cases of belly pain can be watched and treated at home. Watch your belly pain for any changes. Take over-the-counter and prescription medicines only as told by your doctor. Contact a doctor if your belly pain  changes or gets worse. Get help right away if you have very bad pain, cramping, or bloating in your belly. This information is not intended to replace advice given to you by your health care provider. Make sure you discuss any questions you have with your health care provider. Document Revised: 10/16/2018 Document Reviewed: 10/16/2018 Elsevier Patient Education  2023 [employer] Inc[employer]  

## 2022-05-11 LAB — IRON,TIBC AND FERRITIN PANEL
Ferritin: 601 ng/mL — ABNORMAL HIGH (ref 30–400)
Iron Saturation: 33 % (ref 15–55)
Iron: 89 ug/dL (ref 38–169)
Total Iron Binding Capacity: 269 ug/dL (ref 250–450)
UIBC: 180 ug/dL (ref 111–343)

## 2022-05-16 ENCOUNTER — Emergency Department (HOSPITAL_BASED_OUTPATIENT_CLINIC_OR_DEPARTMENT_OTHER): Payer: Medicare PPO | Admitting: Radiology

## 2022-05-16 ENCOUNTER — Emergency Department (HOSPITAL_BASED_OUTPATIENT_CLINIC_OR_DEPARTMENT_OTHER)
Admission: EM | Admit: 2022-05-16 | Discharge: 2022-05-16 | Disposition: A | Discharge status: Home / Self Care | Payer: Medicare PPO | Attending: Emergency Medicine | Admitting: Emergency Medicine

## 2022-05-16 ENCOUNTER — Other Ambulatory Visit: Payer: Self-pay

## 2022-05-16 DIAGNOSIS — M10071 Idiopathic gout, right ankle and foot: Secondary | ICD-10-CM | POA: Diagnosis not present

## 2022-05-16 DIAGNOSIS — M79671 Pain in right foot: Secondary | ICD-10-CM | POA: Diagnosis not present

## 2022-05-16 DIAGNOSIS — M79672 Pain in left foot: Secondary | ICD-10-CM | POA: Diagnosis present

## 2022-05-16 MED ORDER — COLCHICINE 0.6 MG PO TABS: 0.6000 mg | ORAL_TABLET | Freq: Every day | ORAL | 0 refills | Status: DC | PRN

## 2022-05-16 NOTE — ED Triage Notes (Signed)
Patient arrives with complaints new onset right foot pain and difficulty moving. No falls.   Rates pain a 3/10

## 2022-05-16 NOTE — ED Provider Notes (Signed)
[facility] EMERGENCY DEPT Provider Note   CSN: [id number] Arrival date & time: 05/16/22  1310     History  Chief Complaint  Patient presents with   Foot Pain    right    Robert Donaldson is a 66 y.o. male.  He is here with a complaint of right great toe and foot pain that started yesterday.  Michela Pitcher it occurred after he had driven back from [place].  He does not recall any distinct trauma though.  No fevers or swelling.  He took some Naprosyn with improvement in his symptoms although not completely resolved.  No prior history of gout.  He did endorse that he ate some good rich food and alcohol over the weekend  The history is provided by the patient.  Foot Pain This is a new problem. The current episode started yesterday. The problem occurs constantly. The problem has been gradually improving. Pertinent negatives include no chest pain, no abdominal pain, no headaches and no shortness of breath. The symptoms are aggravated by bending and walking. Nothing relieves the symptoms. Treatments tried: nsaid. The treatment provided moderate relief.       Home Medications Prior to Admission medications   Medication Sig Start Date End Date Taking? Authorizing Provider  celecoxib (CELEBREX) 200 MG capsule Take 200 mg by mouth daily. Takes as needed 09/14/19   [provider]  Cholecalciferol 125 MCG (5000 UT) TABS Take 125 mcg by mouth daily. Taking 1 tablet by mouth daily.    [provider]  Clobetasol Prop Emollient Base 0.05 % emollient cream Apply topically 2 (two) times a day if needed (Rash). 01/05/22   Lavonna Monarch, MD  cyclobenzaprine (FLEXERIL) 10 MG tablet Take 1 tablet (10 mg total) by mouth 3 (three) times daily as needed for muscle spasms. 08/13/21   Minette Brine, FNP  diclofenac Sodium (VOLTAREN) 1 % GEL Apply 2 g topically 4 (four) times daily. 08/13/21   Minette Brine, FNP  ferrous sulfate 325 (65 FE) MG EC tablet Take 1 tablet (325 mg total) by  mouth 2 (two) times daily. 01/22/22 01/22/23  Minette Brine, FNP  hydroxychloroquine (PLAQUENIL) 200 MG tablet Take 1 tablet (200 mg total) by mouth 2 (two) times daily. 04/12/22   Minette Brine, FNP  meloxicam (MOBIC) 7.5 MG tablet Take by mouth. As needed 08/21/18   [provider]  rosuvastatin (CRESTOR) 10 MG tablet Take 1 tablet (10 mg total) by mouth daily. 10/13/21   Minette Brine, FNP  tamsulosin (FLOMAX) 0.4 MG CAPS capsule Take 0.4 mg by mouth at bedtime. 04/02/22   [provider]  vitamin E 200 UNIT capsule Take 5,000 Units by mouth daily.     [provider]      Allergies    Patient has no known allergies.    Review of Systems   Review of Systems  Constitutional:  Negative for fever.  Respiratory:  Negative for shortness of breath.   Cardiovascular:  Negative for chest pain.  Gastrointestinal:  Negative for abdominal pain.  Skin:  Negative for wound.  Neurological:  Negative for weakness, numbness and headaches.    Physical Exam Updated Vital Signs BP 118/80   Pulse 65   Temp (!) 97.3 F (36.3 C) (Oral)   Resp 20   Ht '5\' 8"'$  (1.727 m)   Wt 80.7 kg   SpO2 99%   BMI 27.05 kg/m  Physical Exam Vitals and nursing note reviewed.  Constitutional:      Appearance: Normal appearance.  He is well-developed.  HENT:     Head: Normocephalic and atraumatic.  Eyes:     Conjunctiva/sclera: Conjunctivae normal.  Pulmonary:     Effort: Pulmonary effort is normal.  Musculoskeletal:        General: Tenderness present. No deformity.     Cervical back: Neck supple.     Right lower leg: No edema.     Left lower leg: No edema.     Comments: He has some tenderness at his right first MTP.  Minimal swelling.  No crepitus.  No open wounds.  Foot is warm and well-perfused.  DP pulse 1+.  Skin:    General: Skin is warm and dry.  Neurological:     General: No focal deficit present.     Mental Status: He is alert.     GCS: GCS eye subscore is 4. GCS verbal  subscore is 5. GCS motor subscore is 6.     Sensory: No sensory deficit.     Motor: No weakness.     ED Results / Procedures / Treatments   Labs (all labs ordered are listed, but only abnormal results are displayed) Labs Reviewed - No data to display  EKG None  Radiology DG Foot Complete Right  Result Date: 05/16/2022 CLINICAL DATA:  Right foot pain. EXAM: RIGHT FOOT COMPLETE - 3+ VIEW COMPARISON:  02/15/2022 FINDINGS: The joint spaces are maintained. No acute fracture is identified. Moderate size calcaneal heel spur noted. Advanced vascular disease. IMPRESSION: 1. No acute bony findings. 2. Advanced vascular disease. Electronically Signed   By: Marijo Sanes M.D.   On: 05/16/2022 14:02    Procedures Procedures    Medications Ordered in ED Medications - No data to display  ED Course/ Medical Decision Making/ A&P                           Medical Decision Making Amount and/or Complexity of Data Reviewed Radiology: ordered.  Risk Prescription drug management.   This patient complains of right foot pain; this involves an extensive number of treatment Options and is a complaint that carries with it a high risk of complications and morbidity. The differential includes gout, fracture, dislocation, DVT, cellulitis I ordered imaging studies which included x-ray of right foot and I independently    visualized and interpreted imaging which showed no fracture or dislocation Previous records obtained and reviewed in epic no recent admissions Social determinants considered, no significant barriers Critical Interventions: None  After the interventions stated above, I reevaluated the patient and found patient to be minimally symptomatic Admission and further testing considered, no indications for admission or further work-up at this time.  Feel the symptoms likely reflect gout with some improvement on NSAIDs.  Will provide prescription of colchicine and recommended follow-up with  PCP.  Return instructions discussed         Final Clinical Impression(s) / ED Diagnoses Final diagnoses:  Acute idiopathic gout involving toe of right foot    Rx / DC Orders ED Discharge Orders          Ordered    colchicine 0.6 MG tablet  Daily PRN        05/16/22 1658              Hayden Rasmussen, MD 05/17/22 1023

## 2022-05-16 NOTE — Discharge Instructions (Addendum)
You were seen in the emergency department for pain in your right foot.  This is likely a flareup of gout.  We are prescribing colchicine.  The maximum dose is 2 pills initially and 1 pill an hour later if needed.  No more than 3 pills in 1 day.  May cause some GI upset.  You can also use ice to the affected area and elevation.  Follow-up with your regular doctor.  Return to the emergency department if any worsening or concerning symptoms.

## 2022-05-18 ENCOUNTER — Telehealth: Payer: Self-pay

## 2022-05-18 DIAGNOSIS — M5459 Other low back pain: Secondary | ICD-10-CM | POA: Diagnosis not present

## 2022-05-18 NOTE — Telephone Encounter (Signed)
Transition Care Management Follow-up Telephone Call Date of discharge and from where: 05/16/2022  How have you been since you were released from the hospital? Pt states he is doing great.  Any questions or concerns? No  Items Reviewed: Did the pt receive and understand the discharge instructions provided? Yes  Medications obtained and verified? Yes  Other? Yes  Any new allergies since your discharge? No  Dietary orders reviewed? Yes Do you have support at home? Yes   Home Care and Equipment/Supplies: Were home health services ordered? no If so, what is the name of the agency? N/a  Has the agency set up a time to come to the patient's home? no Were any new equipment or medical supplies ordered?  No What is the name of the medical supply agency? N/a Were you able to get the supplies/equipment? no Do you have any questions related to the use of the equipment or supplies? No  Functional Questionnaire: (I = Independent and D = Dependent) ADLs: i  Bathing/Dressing- i  Meal Prep- i  Eating- i  Maintaining continence- i  Transferring/Ambulation- i  Managing Meds- i  Follow up appointments reviewed:  PCP Hospital f/u appt confirmed? No  Scheduled to see n/s on n/a @ n/a. [facility] f/u appt confirmed? No  Scheduled to see n/a on n/a @ n/a. Are transportation arrangements needed? No  If their condition worsens, is the pt aware to call PCP or go to the Emergency Dept.? Yes Was the patient provided with contact information for the PCP's office or ED? Yes Was to pt encouraged to call back with questions or concerns? Yes

## 2022-05-20 DIAGNOSIS — M5459 Other low back pain: Secondary | ICD-10-CM | POA: Diagnosis not present

## 2022-06-24 ENCOUNTER — Other Ambulatory Visit: Payer: Self-pay | Admitting: Nurse Practitioner

## 2022-06-25 NOTE — Telephone Encounter (Signed)
Approve Rosuvastatin.

## 2022-07-20 ENCOUNTER — Emergency Department (HOSPITAL_BASED_OUTPATIENT_CLINIC_OR_DEPARTMENT_OTHER): Payer: Medicare PPO

## 2022-07-20 ENCOUNTER — Encounter (HOSPITAL_BASED_OUTPATIENT_CLINIC_OR_DEPARTMENT_OTHER): Payer: Self-pay | Admitting: Emergency Medicine

## 2022-07-20 ENCOUNTER — Other Ambulatory Visit: Payer: Self-pay

## 2022-07-20 ENCOUNTER — Emergency Department (HOSPITAL_BASED_OUTPATIENT_CLINIC_OR_DEPARTMENT_OTHER): Payer: TRICARE For Life (TFL) | Admitting: Radiology

## 2022-07-20 ENCOUNTER — Emergency Department (HOSPITAL_BASED_OUTPATIENT_CLINIC_OR_DEPARTMENT_OTHER)
Admission: EM | Admit: 2022-07-20 | Discharge: 2022-07-20 | Disposition: A | Discharge status: Home / Self Care | Payer: Medicare PPO | Attending: Emergency Medicine | Admitting: Emergency Medicine

## 2022-07-20 DIAGNOSIS — N3289 Other specified disorders of bladder: Secondary | ICD-10-CM | POA: Diagnosis not present

## 2022-07-20 DIAGNOSIS — R1011 Right upper quadrant pain: Secondary | ICD-10-CM | POA: Diagnosis not present

## 2022-07-20 DIAGNOSIS — K5730 Diverticulosis of large intestine without perforation or abscess without bleeding: Secondary | ICD-10-CM | POA: Diagnosis not present

## 2022-07-20 DIAGNOSIS — K859 Acute pancreatitis without necrosis or infection, unspecified: Secondary | ICD-10-CM | POA: Insufficient documentation

## 2022-07-20 DIAGNOSIS — R0789 Other chest pain: Secondary | ICD-10-CM | POA: Diagnosis not present

## 2022-07-20 DIAGNOSIS — R1013 Epigastric pain: Secondary | ICD-10-CM

## 2022-07-20 DIAGNOSIS — K6389 Other specified diseases of intestine: Secondary | ICD-10-CM | POA: Diagnosis not present

## 2022-07-20 DIAGNOSIS — R079 Chest pain, unspecified: Secondary | ICD-10-CM | POA: Diagnosis not present

## 2022-07-20 DIAGNOSIS — R109 Unspecified abdominal pain: Secondary | ICD-10-CM | POA: Diagnosis present

## 2022-07-20 HISTORY — DX: Benign prostatic hyperplasia without lower urinary tract symptoms: N40.0

## 2022-07-20 LAB — CBC
HCT: 44.5 % (ref 39.0–52.0)
Hemoglobin: 15.2 g/dL (ref 13.0–17.0)
MCH: 30.9 pg (ref 26.0–34.0)
MCHC: 34.2 g/dL (ref 30.0–36.0)
MCV: 90.4 fL (ref 80.0–100.0)
Platelets: 225 10*3/uL (ref 150–400)
RBC: 4.92 MIL/uL (ref 4.22–5.81)
RDW: 12.6 % (ref 11.5–15.5)
WBC: 11.1 10*3/uL — ABNORMAL HIGH (ref 4.0–10.5)
nRBC: 0 % (ref 0.0–0.2)

## 2022-07-20 LAB — TROPONIN I (HIGH SENSITIVITY)
Troponin I (High Sensitivity): 2 ng/L (ref <18)
Troponin I (High Sensitivity): 3 ng/L (ref <18)

## 2022-07-20 LAB — HEPATIC FUNCTION PANEL
ALT: 22 U/L (ref 0–44)
AST: 28 U/L (ref 15–41)
Albumin: 4.6 g/dL (ref 3.5–5.0)
Alkaline Phosphatase: 50 U/L (ref 38–126)
Bilirubin, Direct: 0.2 mg/dL (ref 0.0–0.2)
Indirect Bilirubin: 0.8 mg/dL (ref 0.3–0.9)
Total Bilirubin: 1 mg/dL (ref 0.3–1.2)
Total Protein: 8.2 g/dL — ABNORMAL HIGH (ref 6.5–8.1)

## 2022-07-20 LAB — URINALYSIS, ROUTINE W REFLEX MICROSCOPIC
Bilirubin Urine: NEGATIVE
Glucose, UA: NEGATIVE mg/dL
Hgb urine dipstick: NEGATIVE
Ketones, ur: NEGATIVE mg/dL
Leukocytes,Ua: NEGATIVE
Nitrite: NEGATIVE
Specific Gravity, Urine: 1.017 (ref 1.005–1.030)
pH: 7.5 (ref 5.0–8.0)

## 2022-07-20 LAB — BASIC METABOLIC PANEL
Anion gap: 11 (ref 5–15)
BUN: 15 mg/dL (ref 8–23)
CO2: 24 mmol/L (ref 22–32)
Calcium: 10.1 mg/dL (ref 8.9–10.3)
Chloride: 101 mmol/L (ref 98–111)
Creatinine, Ser: 1.19 mg/dL (ref 0.61–1.24)
GFR, Estimated: >60 mL/min (ref >60)
Glucose, Bld: 101 mg/dL — ABNORMAL HIGH (ref 70–99)
Potassium: 4.6 mmol/L (ref 3.5–5.1)
Sodium: 136 mmol/L (ref 135–145)

## 2022-07-20 LAB — LIPASE, BLOOD: Lipase: 17 U/L (ref 11–51)

## 2022-07-20 MED ORDER — KETOROLAC TROMETHAMINE 30 MG/ML IJ SOLN: 30.0000 mg | Freq: Once | INTRAMUSCULAR | Status: DC

## 2022-07-20 MED ORDER — METOCLOPRAMIDE HCL 10 MG PO TABS: 10.0000 mg | ORAL_TABLET | Freq: Four times a day (QID) | ORAL | 0 refills | Status: DC | PRN

## 2022-07-20 MED ORDER — KETOROLAC TROMETHAMINE 30 MG/ML IJ SOLN
15.0000 mg | Freq: Once | INTRAMUSCULAR | Status: AC
  Administered 2022-07-20: 15 mg via INTRAVENOUS
  Filled 2022-07-20: qty 1

## 2022-07-20 MED ORDER — MORPHINE SULFATE (PF) 2 MG/ML IV SOLN
2.0000 mg | Freq: Once | INTRAVENOUS | Status: AC
  Administered 2022-07-20: 2 mg via INTRAVENOUS
  Filled 2022-07-20: qty 1

## 2022-07-20 MED ORDER — HYDROCODONE-ACETAMINOPHEN 5-325 MG PO TABS: 2.0000 | ORAL_TABLET | ORAL | 0 refills | Status: DC | PRN

## 2022-07-20 NOTE — ED Notes (Signed)
Discharge paperwork given and verbally understood. 

## 2022-07-20 NOTE — ED Triage Notes (Signed)
Pt arrives to ED with c/o right sided chest pain that started yesterday when pt was at rest. He notes chronic lower back pain but yesterday the pain has worsened. Pain described as dull/discomfort and constant. No SOB. Located towards bottom of right rib cage. No N/V, diaphoresis. Associated with gastric reflux.

## 2022-07-20 NOTE — Discharge Instructions (Addendum)
Contact a health care provider if: You do not get better as fast as expected. Your symptoms get worse or you get new symptoms. You keep having pain, weakness, or nausea. You get better and then pain comes back. You have a fever. Get help right away if: You vomit every time you eat or drink. Your pain becomes severe. Your skin or the white parts of your eyes turn yellow (jaundice). You have sudden swelling in your abdomen. You feel dizzy or you faint. Your blood sugar is high (over 300 mg/dL). You vomit blood. These symptoms may be an emergency. Get help right away. Call 911. Do not wait to see if the symptoms will go away. Do not drive yourself to the hospital.

## 2022-07-20 NOTE — ED Notes (Signed)
Pt aware of the need for a urine... Unable to currently provide... 

## 2022-07-20 NOTE — ED Provider Notes (Signed)
[facility] Provider Note   CSN: [id number] Arrival date & time: 07/20/22  1200     History  Chief Complaint  Patient presents with   Chest Pain    Robert Donaldson is a 67 y.o. male chest pain. Pain starts in his Fl[place] and radiates into his RUQ. Worse yesterday after eating a meal. Rates it as 2/10. He denies any other aggravating or alleviating factors. He drinks 2 cocktails daily. No exertional pain, nausea, vomiting or diaphoresis. Denies URI. He has had recent covid exposure.    Chest Pain      Home Medications Prior to Admission medications   Medication Sig Start Date End Date Taking? Authorizing Provider  HYDROcodone-acetaminophen (NORCO) 5-325 MG tablet Take 2 tablets by mouth every 4 (four) hours as needed. 07/20/22  Yes Atoya Andrew, PA-C  metoCLOPramide (REGLAN) 10 MG tablet Take 1 tablet (10 mg total) by mouth every 6 (six) hours as needed for nausea (nausea/headache). 07/20/22  Yes Daion Ginsberg, PA-C  acyclovir ointment (ZOVIRAX) 5 % APPLY 1 APPLICATION TOPICALLY EVERY 3 HOURS 06/25/22   Minette Brine, FNP  celecoxib (CELEBREX) 200 MG capsule Take 200 mg by mouth daily. Takes as needed 09/14/19   [provider]  Cholecalciferol 125 MCG (5000 UT) TABS Take 125 mcg by mouth daily. Taking 1 tablet by mouth daily.    [provider]  Clobetasol Prop Emollient Base 0.05 % emollient cream Apply topically 2 (two) times a day if needed (Rash). 01/05/22   Lavonna Monarch, MD  colchicine 0.6 MG tablet Take 1 tablet (0.6 mg total) by mouth daily as needed (gout). 05/16/22   Hayden Rasmussen, MD  cyclobenzaprine (FLEXERIL) 10 MG tablet Take 1 tablet (10 mg total) by mouth 3 (three) times daily as needed for muscle spasms. 08/13/21   Minette Brine, FNP  diclofenac Sodium (VOLTAREN) 1 % GEL Apply 2 g topically 4 (four) times daily. 08/13/21   Minette Brine, FNP  ferrous sulfate 325 (65 FE) MG EC tablet Take 1 tablet (325  mg total) by mouth 2 (two) times daily. 01/22/22 01/22/23  Minette Brine, FNP  hydroxychloroquine (PLAQUENIL) 200 MG tablet Take 1 tablet (200 mg total) by mouth 2 (two) times daily. 04/12/22   Minette Brine, FNP  meloxicam (MOBIC) 7.5 MG tablet Take by mouth. As needed 08/21/18   [provider]  rosuvastatin (CRESTOR) 10 MG tablet TAKE 1 TABLET DAILY 06/25/22   Minette Brine, FNP  tamsulosin (FLOMAX) 0.4 MG CAPS capsule Take 0.4 mg by mouth at bedtime. 04/02/22   [provider]  vitamin E 200 UNIT capsule Take 5,000 Units by mouth daily.     [provider]      Allergies    Patient has no known allergies.    Review of Systems   Review of Systems  Cardiovascular:  Positive for chest pain.    Physical Exam Updated Vital Signs BP 117/77   Pulse 70   Temp 99.1 F (37.3 C) (Oral)   Resp 20   Ht '5\' 9"'$  (1.753 m)   Wt 81.6 kg   SpO2 95%   BMI 26.58 kg/m  Physical Exam Vitals and nursing note reviewed.  Constitutional:      General: He is not in acute distress.    Appearance: He is well-developed. He is not diaphoretic.  HENT:     Head: Normocephalic and atraumatic.  Eyes:     General: No scleral icterus.    Conjunctiva/sclera: Conjunctivae  normal.  Cardiovascular:     Rate and Rhythm: Normal rate and regular rhythm.     Heart sounds: Normal heart sounds.  Pulmonary:     Effort: Pulmonary effort is normal. No respiratory distress.     Breath sounds: Normal breath sounds.  Abdominal:     Palpations: Abdomen is soft.     Tenderness: There is generalized abdominal tenderness and tenderness in the right upper quadrant, epigastric area and left upper quadrant. There is no right CVA tenderness or left CVA tenderness.  Musculoskeletal:     Cervical back: Normal range of motion and neck supple.  Skin:    General: Skin is warm and dry.  Neurological:     Mental Status: He is alert.  Psychiatric:        Behavior: Behavior normal.     ED Results /  Procedures / Treatments   Labs (all labs ordered are listed, but only abnormal results are displayed) Labs Reviewed  BASIC METABOLIC PANEL - Abnormal; Notable for the following components:      Result Value   Glucose, Bld 101 (*)    All other components within normal limits  CBC - Abnormal; Notable for the following components:   WBC 11.1 (*)    All other components within normal limits  HEPATIC FUNCTION PANEL - Abnormal; Notable for the following components:   Total Protein 8.2 (*)    All other components within normal limits  URINALYSIS, ROUTINE W REFLEX MICROSCOPIC - Abnormal; Notable for the following components:   Protein, ur TRACE (*)    All other components within normal limits  LIPASE, BLOOD  TROPONIN I (HIGH SENSITIVITY)  TROPONIN I (HIGH SENSITIVITY)    EKG None  Radiology CT ABDOMEN PELVIS WO CONTRAST  Result Date: 07/20/2022 CLINICAL DATA:  Abdominal/flank pain, stone suspected EXAM: CT ABDOMEN AND PELVIS WITHOUT CONTRAST TECHNIQUE: Multidetector CT imaging of the abdomen and pelvis was performed following the standard protocol without IV contrast. RADIATION DOSE REDUCTION: This exam was performed according to the departmental dose-optimization program which includes automated exposure control, adjustment of the mA and/or kV according to patient size and/or use of iterative reconstruction technique. COMPARISON:  Right upper quadrant ultrasound earlier today. FINDINGS: Lower chest: Clear lung bases. Normal heart size. Hepatobiliary: No focal liver abnormality is seen. No gallstones, gallbladder wall thickening, or biliary dilatation. Pancreas: Faint peripancreatic fat stranding about the head, for example series 2, image 29. No ductal dilatation or acute airspace disease. 3 mm peripancreatic lymph node in the region of fat stranding. Spleen: Normal in size without focal abnormality. Adrenals/Urinary Tract: Normal adrenal glands. No hydronephrosis, renal calculi or perinephric  edema. No evidence of focal renal lesion on this unenhanced exam. Decompressed ureters. Unremarkable urinary bladder Stomach/Bowel: Tiny hiatal hernia. Decompressed stomach. Small bowel is decompressed. The appendix is normal. No bowel obstruction or inflammation. Left colonic diverticulosis without diverticulitis. Vascular/Lymphatic: Mild aortic and branch atherosclerosis. No aneurysm. No enlarged lymph nodes in the abdomen or pelvis. Reproductive: Prostate is unremarkable. Other: No ascites or free air. Small fat containing right inguinal hernia. Diminutive fat containing umbilical hernia. Musculoskeletal: There are 4 non-rib-bearing lumbar vertebra, suspect sacralized L5. L3-L4 and L4-L5 degenerative disc disease with additional multilevel spondylosis. IMPRESSION: 1. No renal stones or obstructive uropathy. 2. Faint fat stranding about the head of the pancreas can be seen in the setting of pancreatitis. 3. Left colonic diverticulosis without diverticulitis. 4. Small fat containing right inguinal hernia. Diminutive fat containing umbilical hernia. Aortic Atherosclerosis (ICD10-I70.0). Electronically  Signed   By: Keith Rake M.D.   On: 07/20/2022 15:23   US Abdomen Limited RUQ (LIVER/GB)  Result Date: 07/20/2022 CLINICAL DATA:  RUQ pain EXAM: ULTRASOUND ABDOMEN LIMITED RIGHT UPPER QUADRANT COMPARISON:  None Available. FINDINGS: Gallbladder: No gallstones or wall thickening visualized. No sonographic Murphy sign noted by sonographer. Common bile duct: Diameter: 0.2 cm, which is normal. Liver: No focal lesion identified. Within normal limits in parenchymal echogenicity. Portal vein is patent on color Doppler imaging with normal direction of blood flow towards the liver. Other: None. IMPRESSION: No sonographic finding to explain right upper quadrant pain. Electronically Signed   By: Marin Roberts M.D.   On: 07/20/2022 14:02   DG Chest 2 View  Result Date: 07/20/2022 CLINICAL DATA:  Right-sided chest pain  starting yesterday. EXAM: CHEST - 2 VIEW COMPARISON:  None Available. FINDINGS: Cardiac silhouette and mediastinal contours are within normal limits. The lungs are clear. No pleural effusion or pneumothorax. Mild-to-moderate multilevel degenerative disc changes of the thoracic spine. IMPRESSION: No active cardiopulmonary disease. Electronically Signed   By: Yvonne Kendall M.D.   On: 07/20/2022 12:32    Procedures Procedures    Medications Ordered in ED Medications  ketorolac (TORADOL) 30 MG/ML injection 15 mg (15 mg Intravenous Given 07/20/22 1559)  morphine (PF) 2 MG/ML injection 2 mg (2 mg Intravenous Given 07/20/22 1559)    ED Course/ Medical Decision Making/ A&P Clinical Course as of 07/21/22 1508  Tue Jul 20, 2022  1413 DG Chest 2 View [AH]    Clinical Course User Index [AH] Margarita Mail, PA-C                             Medical Decision Making Given the large differential diagnosis for Robert Donaldson, the decision making in this case is of high complexity.  Patient sxs were extremely vague but less likely cardiac and more likely GI related. After review of all data points, patient may have a very mild pancreatitis.  This would fit his sxs of discomfort, epigastric pain radiating to the back. Plan to dc patient on clear liquid, pain control and antiemetics.  PDMP reviewed during this encounter.\  After evaluating all of the data points in this case, the presentation of Robert Donaldson is NOT consistent with Acute Coronary Syndrome (ACS) and/or myocardial ischemia, pulmonary embolism, aortic dissection; Robert Donaldson, significant arrythmia, pneumothorax, cardiac tamponade, or other emergent cardiopulmonary condition.  Further, the presentation of Robert Donaldson is NOT consistent with pericarditis, myocarditis, cholecystitis, pancreatitis, mediastinitis, endocarditis, new valvular disease.  Additionally, the presentation of Robert Donaldson NOT consistent with flail chest,  cardiac contusion, ARDS, or significant intra-thoracic or intra-abdominal bleeding.  Moreover, this presentation is NOT consistent with pneumonia, sepsis, or pyelonephritis.  Strict return and follow-up precautions have been given by me personally or by detailed written instruction given verbally by nursing staff using the teach back method to the patient/family/caregiver(s).  Data Reviewed/Counseling: I have reviewed the patient's vital signs, nursing notes, and other relevant tests/information. I had a detailed discussion regarding the historical points, exam findings, and any diagnostic results supporting the discharge diagnosis. I also discussed the need for outpatient follow-up and the need to return to the ED if symptoms worsen or if there are any questions or concerns that arise at home.    Amount and/or Complexity of Data Reviewed Labs: ordered.    Details: Labs reviewed, Acute findings include mild leukocytosis, 2 negative troponins.lipase wnl Otherwise unremarkable  Radiology: ordered and independent interpretation performed. Decision-making details documented in ED Course.    Details: CT abd pelvis- shows mild fat stranding around head of the pancreas. No other acute findings on ct/ [place] RUQ or CXR.  Risk Prescription drug management.           Final Clinical Impression(s) / ED Diagnoses Final diagnoses:  Epigastric pain  Acute pancreatitis, unspecified complication status, unspecified pancreatitis type    Rx / DC Orders ED Discharge Orders          Ordered    HYDROcodone-acetaminophen (NORCO) 5-325 MG tablet  Every 4 hours PRN        07/20/22 1729    metoCLOPramide (REGLAN) 10 MG tablet  Every 6 hours PRN        07/20/22 1729              Margarita Mail, PA-C 07/21/22 1532    Elnora Morrison, MD 07/27/22 [phone]

## 2022-07-20 NOTE — ED Notes (Signed)
Unable to currently collect urine... Pt unable to urinate.Marland KitchenMarland KitchenMarland Kitchen

## 2022-07-28 ENCOUNTER — Telehealth: Payer: Self-pay

## 2022-07-28 DIAGNOSIS — M79609 Pain in unspecified limb: Secondary | ICD-10-CM | POA: Insufficient documentation

## 2022-07-28 DIAGNOSIS — G4733 Obstructive sleep apnea (adult) (pediatric): Secondary | ICD-10-CM | POA: Insufficient documentation

## 2022-07-28 DIAGNOSIS — I4510 Unspecified right bundle-branch block: Secondary | ICD-10-CM | POA: Insufficient documentation

## 2022-07-28 NOTE — Telephone Encounter (Signed)
     Patient  visit on 07/20/2022  at [facility] was for chest pain.  Have you been able to follow up with your primary care physician? Patient has appointment 07/29/2022.  The patient was or was not able to obtain any needed medicine or equipment. Patient was able to obtain medication.  Are there diet recommendations that you are having difficulty following? No  Patient expresses understanding of discharge instructions and education provided has no other needs at this time. Yes   [facility] Care Guide   [email or web address]  ?? [phone]   Website: [email or web address]

## 2022-07-29 ENCOUNTER — Encounter: Payer: Self-pay | Admitting: Nurse Practitioner

## 2022-07-29 ENCOUNTER — Ambulatory Visit (INDEPENDENT_AMBULATORY_CARE_PROVIDER_SITE_OTHER): Payer: Medicare PPO | Admitting: Nurse Practitioner

## 2022-07-29 VITALS — BP 118/72 | HR 59 | Temp 98.4°F | Ht 69.0 in | Wt 176.0 lb

## 2022-07-29 DIAGNOSIS — K5790 Diverticulosis of intestine, part unspecified, without perforation or abscess without bleeding: Secondary | ICD-10-CM | POA: Diagnosis not present

## 2022-07-29 DIAGNOSIS — E782 Mixed hyperlipidemia: Secondary | ICD-10-CM

## 2022-07-29 DIAGNOSIS — R0789 Other chest pain: Secondary | ICD-10-CM | POA: Diagnosis not present

## 2022-07-29 MED ORDER — ROSUVASTATIN CALCIUM 10 MG PO TABS: 10.0000 mg | ORAL_TABLET | Freq: Every day | ORAL | 0 refills | Status: DC

## 2022-07-29 NOTE — Patient Instructions (Addendum)
High Cholesterol  High cholesterol is a condition in which the blood has high levels of a white, waxy substance similar to fat (cholesterol). The liver makes all the cholesterol that the body needs. The human body needs small amounts of cholesterol to help build cells. A person gets extra or excess cholesterol from the food that he or she eats. The blood carries cholesterol from the liver to the rest of the body. If you have high cholesterol, deposits (plaques) may build up on the walls of your arteries. Arteries are the blood vessels that carry blood away from your heart. These plaques make the arteries narrow and stiff. Cholesterol plaques increase your risk for heart attack and stroke. Work with your health care provider to keep your cholesterol levels in a healthy range. What increases the risk? The following factors may make you more likely to develop this condition: Eating foods that are high in animal fat (saturated fat) or cholesterol. Being overweight. Not getting enough exercise. A family history of high cholesterol (familial hypercholesterolemia). Use of tobacco products. Having diabetes. What are the signs or symptoms? In most cases, high cholesterol does not usually cause any symptoms. In severe cases, very high cholesterol levels can cause: Fatty bumps under the skin (xanthomas). A white or gray ring around the black center (pupil) of the eye. How is this diagnosed? This condition may be diagnosed based on the results of a blood test. If you are older than 69 years of age, your health care provider may check your cholesterol levels every 4-6 years. You may be checked more often if you have high cholesterol or other risk factors for heart disease. The blood test for cholesterol measures: "Bad" cholesterol, or LDL cholesterol. This is the main type of cholesterol that causes heart disease. The desired level is less than 100 mg/dL (2.59 mmol/L). "Good" cholesterol, or HDL  cholesterol. HDL helps protect against heart disease by cleaning the arteries and carrying the LDL to the liver for processing. The desired level for HDL is 60 mg/dL (1.55 mmol/L) or higher. Triglycerides. These are fats that your body can store or burn for energy. The desired level is less than 150 mg/dL (1.69 mmol/L). Total cholesterol. This measures the total amount of cholesterol in your blood and includes LDL, HDL, and triglycerides. The desired level is less than 200 mg/dL (5.17 mmol/L). How is this treated? Treatment for high cholesterol starts with lifestyle changes, such as diet and exercise. Diet changes. You may be asked to eat foods that have more fiber and less saturated fats or added sugar. Lifestyle changes. These may include regular exercise, maintaining a healthy weight, and quitting use of tobacco products. Medicines. These are given when diet and lifestyle changes have not worked. You may be prescribed a statin medicine to help lower your cholesterol levels. Follow these instructions at home: Eating and drinking  Eat a healthy, balanced diet. This diet includes: Daily servings of a variety of fresh, frozen, or canned fruits and vegetables. Daily servings of whole grain foods that are rich in fiber. Foods that are low in saturated fats and trans fats. These include poultry and fish without skin, lean cuts of meat, and low-fat dairy products. A variety of fish, especially oily fish that contain omega-3 fatty acids. Aim to eat fish at least 2 times a week. Avoid foods and drinks that have added sugar. Use healthy cooking methods, such as roasting, grilling, broiling, baking, poaching, steaming, and stir-frying. Do not fry your food except for   stir-frying. If you drink alcohol: Limit how much you have to: 0-1 drink a day for women who are not pregnant. 0-2 drinks a day for men. Know how much alcohol is in a drink. In the [place] one drink equals one 12 oz bottle of beer (355 mL),  one 5 oz glass of wine (148 mL), or one 1 oz glass of hard liquor (44 mL). Lifestyle  Get regular exercise. Aim to exercise for a total of 150 minutes a week. Increase your activity level by doing activities such as gardening, walking, and taking the stairs. Do not use any products that contain nicotine or tobacco. These products include cigarettes, chewing tobacco, and vaping devices, such as e-cigarettes. If you need help quitting, ask your health care provider. General instructions Take over-the-counter and prescription medicines only as told by your health care provider. Keep all follow-up visits. This is important. Where to find more information American Heart Association: www.heart.org National Heart, Lung, and Blood Institute: https://wilson-eaton.com/ Contact a health care provider if: You have trouble achieving or maintaining a healthy diet or weight. You are starting an exercise program. You are unable to stop smoking. Get help right away if: You have chest pain. You have trouble breathing. You have discomfort or pain in your jaw, neck, back, shoulder, or arm. You have any symptoms of a stroke. "BE FAST" is an easy way to remember the main warning signs of a stroke: B - Balance. Signs are dizziness, sudden trouble walking, or loss of balance. E - Eyes. Signs are trouble seeing or a sudden change in vision. F - Face. Signs are sudden weakness or numbness of the face, or the face or eyelid drooping on one side. A - Arms. Signs are weakness or numbness in an arm. This happens suddenly and usually on one side of the body. S - Speech. Signs are sudden trouble speaking, slurred speech, or trouble understanding what people say. T - Time. Time to call emergency services. Write down what time symptoms started. You have other signs of a stroke, such as: A sudden, severe headache with no known cause. Nausea or vomiting. Seizure. These symptoms may represent a serious problem that is an  emergency. Do not wait to see if the symptoms will go away. Get medical help right away. Call your local emergency services (911 in the [place] Do not drive yourself to the hospital. Summary Cholesterol plaques increase your risk for heart attack and stroke. Work with your health care provider to keep your cholesterol levels in a healthy range. Eat a healthy, balanced diet, get regular exercise, and maintain a healthy weight. Do not use any products that contain nicotine or tobacco. These products include cigarettes, chewing tobacco, and vaping devices, such as e-cigarettes. Get help right away if you have any symptoms of a stroke. This information is not intended to replace advice given to you by your health care provider. Make sure you discuss any questions you have with your health care provider. Document Revised: 01/08/2022 Document Reviewed: 08/11/2020 Elsevier Patient Education  [place].   Diverticulosis  Diverticulosis is a condition that develops when small pouches (diverticula) form in the wall of the large intestine (colon). The colon is where water is absorbed and stool (feces) is formed. The pouches form when the inside layer of the colon pushes through weak spots in the outer layers of the colon. You may have a few pouches or many of them. The pouches usually do not cause problems unless they  become inflamed or infected. When this happens, the condition is called diverticulitis. What are the causes? The cause of this condition is not known. What increases the risk? The following factors may make you more likely to develop this condition: Being older than age 63. Your risk for this condition increases with age. Diverticulosis is rare among people younger than age 23. By age 32, many people have it. Eating a low-fiber diet. Having frequent constipation. Being overweight. Not getting enough exercise. Smoking. Taking over-the-counter pain medicines, like aspirin and  ibuprofen. Having a family history of diverticulosis. What are the signs or symptoms? In most people, there are no symptoms of this condition. If you do have symptoms, they may include: Bloating. Cramps in the abdomen. Constipation or diarrhea. Pain in the lower left side of the abdomen. How is this diagnosed? Because diverticulosis usually has no symptoms, it is most often diagnosed during an exam for other colon problems. The condition may be diagnosed by: Using a flexible scope to examine the colon (colonoscopy). Taking an X-ray of the colon after dye has been put into the colon (barium enema). Having a CT scan. How is this treated? You may not need treatment for this condition. Your health care provider may recommend treatment to prevent problems. You may need treatment if you have symptoms or if you previously had diverticulitis. Treatment may include: Eating a high-fiber diet. Taking a fiber supplement. Taking a live bacteria supplement (probiotic). Taking medicine to relax your colon. Follow these instructions at home: Medicines Take over-the-counter and prescription medicines only as told by your health care provider. If told by your health care provider, take a fiber supplement or probiotic. Constipation prevention Your condition may cause constipation. To prevent or treat constipation, you may need to: Drink enough fluid to keep your urine pale yellow. Take over-the-counter or prescription medicines. Eat foods that are high in fiber, such as beans, whole grains, and fresh fruits and vegetables. Limit foods that are high in fat and processed sugars, such as fried or sweet foods.  General instructions Try not to strain when you have a bowel movement. Keep all follow-up visits as told by your health care provider. This is important. Contact a health care provider if you: Have pain in your abdomen. Have bloating. Have cramps. Have not had a bowel movement in 3 days. Get  help right away if: Your pain gets worse. Your bloating becomes very bad. You have a fever or chills, and your symptoms suddenly get worse. You vomit. You have bowel movements that are bloody or black. You have bleeding from your rectum. Summary Diverticulosis is a condition that develops when small pouches (diverticula) form in the wall of the large intestine (colon). You may have a few pouches or many of them. This condition is most often diagnosed during an exam for other colon problems. Treatment may include increasing the fiber in your diet, taking supplements, or taking medicines. This information is not intended to replace advice given to you by your health care provider. Make sure you discuss any questions you have with your health care provider. Document Revised: 01/04/2019 Document Reviewed: 01/04/2019 Elsevier Patient Education  [place].

## 2022-07-29 NOTE — Progress Notes (Signed)
I,Sheena H Holbrook,acting as a Education administrator for Minette Brine, FNP.,have documented all relevant documentation on the behalf of Minette Brine, FNP,as directed by  Minette Brine, FNP while in the presence of Minette Brine, [place].    Subjective:     Patient ID: Robert Donaldson , male    DOB: [birth year] , 67 y.o.   MRN: [id number]   Chief Complaint  Patient presents with   Follow-up    ED visit 07/20/22, pancreatitis    HPI  Patient presents today for ED follow up, 07/20/22 after having chest pain and lower abdomen pain. Patient was diagnosed with acute pancreatitis and seen diverticulosis on imaging. . Patient denies continued abd pain, n/v/d.   He is feeling better today.     Past Medical History:  Diagnosis Date   BPH (benign prostatic hyperplasia)    HLD (hyperlipidemia)    Pancreatitis    Vitamin D deficiency      Family History  Problem Relation Age of Onset   Hyperlipidemia Sister      Current Outpatient Medications:    acyclovir ointment (ZOVIRAX) 5 %, APPLY 1 APPLICATION TOPICALLY EVERY 3 HOURS, Disp: 30 g, Rfl: 11   celecoxib (CELEBREX) 200 MG capsule, Take 200 mg by mouth daily. Takes as needed, Disp: , Rfl:    Cholecalciferol 125 MCG (5000 UT) TABS, Take 125 mcg by mouth daily. Taking 1 tablet by mouth daily., Disp: , Rfl:    clobetasol cream (TEMOVATE) [id number] %, Apply 1 Application topically 2 (two) times daily., Disp: , Rfl:    cyclobenzaprine (FLEXERIL) 10 MG tablet, Take 1 tablet (10 mg total) by mouth 3 (three) times daily as needed for muscle spasms., Disp: 30 tablet, Rfl: 0   diclofenac Sodium (VOLTAREN) 1 % GEL, Apply 2 g topically 4 (four) times daily., Disp: 100 g, Rfl: 2   ferrous sulfate 325 (65 FE) MG EC tablet, Take 1 tablet (325 mg total) by mouth 2 (two) times daily., Disp: 180 tablet, Rfl: 1   hydroxychloroquine (PLAQUENIL) 200 MG tablet, Take 1 tablet (200 mg total) by mouth 2 (two) times daily., Disp: 60 tablet, Rfl: 0   meloxicam (MOBIC) 7.5 MG tablet, Take  by mouth. As needed, Disp: , Rfl:    Multiple Vitamins-Minerals (ADULT GUMMY PO), Take 1 each by mouth in the morning, at noon, and at bedtime. FIBER, Disp: , Rfl:    tamsulosin (FLOMAX) 0.4 MG CAPS capsule, Take 0.4 mg by mouth at bedtime., Disp: , Rfl:    vitamin E 200 UNIT capsule, Take 5,000 Units by mouth daily. , Disp: , Rfl:    colchicine 0.6 MG tablet, Take 1 tablet (0.6 mg total) by mouth daily as needed (gout). (Patient not taking: Reported on 07/29/2022), Disp: 20 tablet, Rfl: 0   HYDROcodone-acetaminophen (NORCO) 5-325 MG tablet, Take 2 tablets by mouth every 4 (four) hours as needed. (Patient not taking: Reported on 07/29/2022), Disp: 10 tablet, Rfl: 0   metoCLOPramide (REGLAN) 10 MG tablet, Take 1 tablet (10 mg total) by mouth every 6 (six) hours as needed for nausea (nausea/headache). (Patient not taking: Reported on 07/29/2022), Disp: 6 tablet, Rfl: 0   rosuvastatin (CRESTOR) 10 MG tablet, Take 1 tablet (10 mg total) by mouth daily., Disp: 95 tablet, Rfl: 3   No Known Allergies   Review of Systems  Constitutional: Negative.   Respiratory: Negative.    Cardiovascular: Negative.   Neurological: Negative.   Psychiatric/Behavioral: Negative.    All other systems reviewed and are negative.  Today's Vitals   07/29/22 1214  BP: 118/72  Pulse: (!) 59  Temp: 98.4 F (36.9 C)  TempSrc: Oral  SpO2: 95%  Weight: 176 lb (79.8 kg)  Height: '5\' 9"'$  (1.753 m)   Body mass index is 25.99 kg/m.   Objective:  Physical Exam Vitals reviewed.  Constitutional:      General: He is not in acute distress.    Appearance: Normal appearance.  Cardiovascular:     Rate and Rhythm: Normal rate and regular rhythm.     Pulses: Normal pulses.     Heart sounds: No murmur heard. Pulmonary:     Effort: Pulmonary effort is normal. No respiratory distress.     Breath sounds: Normal breath sounds. No wheezing.  Abdominal:     General: Abdomen is flat. Bowel sounds are normal. There is no distension.      Palpations: Abdomen is soft.     Tenderness: There is no abdominal tenderness.  Skin:    General: Skin is warm and dry.     Capillary Refill: Capillary refill takes less than 2 seconds.  Neurological:     General: No focal deficit present.     Mental Status: He is alert and oriented to person, place, and time.         Assessment And Plan:     1. Other chest pain Comments: Seen in ER with EKG done no abnormal findings. Thought to be related to GERD and a mild case of Pancreatitis. Encouraged to limit alcohol intake  2. Diverticulosis Comments: Seen on CT abdomen with recent ER visit. Discussed avoiding food triggers - spicy, foods with seeds  3. Mixed hyperlipidemia Comments: Continue current medications tolerating well. Cholesterol levels were normal.     Patient was given opportunity to ask questions. Patient verbalized understanding of the plan and was able to repeat key elements of the plan. All questions were answered to their satisfaction.  Minette Brine, FNP   I, Minette Brine, FNP, have reviewed all documentation for this visit. The documentation on 07/29/22 for the exam, diagnosis, procedures, and orders are all accurate and complete.   IF YOU HAVE BEEN REFERRED TO A SPECIALIST, IT MAY TAKE 1-2 WEEKS TO SCHEDULE/PROCESS THE REFERRAL. IF YOU HAVE NOT HEARD FROM US/SPECIALIST IN TWO WEEKS, PLEASE GIVE [place] A CALL AT [phone].   THE PATIENT IS ENCOURAGED TO PRACTICE SOCIAL DISTANCING DUE TO THE COVID-19 PANDEMIC.

## 2022-08-11 ENCOUNTER — Telehealth: Payer: Self-pay

## 2022-08-11 NOTE — Progress Notes (Signed)
Care Management & Coordination Services Pharmacy Team  Reason for Encounter: Appointment Reminder  Contacted patient to confirm telephone appointment with Orlando Penner, PharmD on 08-13-2022 at 10:00. Spoke with patient on 08/11/2022   Do you have any problems getting your medications? No  What is your top health concern you would like to discuss at your upcoming visit? Patient stated nothing at the moment  Have you seen any other providers since your last visit with PCP? No   Chart review: Recent office visits:  07-29-2022 Minette Brine, [place]. FINISHED clobetasol cream.  05-10-2022 Minette Brine, FNP. Ferritin= 601.  Recent consult visits:  None  Hospital visits:  Medication Reconciliation was completed by comparing discharge summary, patient's EMR and Pharmacy list, and upon discussion with patient.  Admitted to the hospital on 07-20-2022 due to Chest pain. Discharge date was 07-20-2022. Discharged from [facility] ED at [place]?Medications Started at [facility] Discharge:?? Hydro/acetaminophen 5-325 mg 2 tablets every 4 hours PRN Reglan 10 mg every 6 hours PRN  Medication Changes at Hospital Discharge: None  Medications Discontinued at Hospital Discharge: None  Medications that remain the same after Hospital Discharge:??  -All other medications will remain the same.    Hospital visits:  Medication Reconciliation was completed by comparing discharge summary, patient's EMR and Pharmacy list, and upon discussion with patient.  Admitted to the hospital on 05-16-2022 due to Acute idiopathic gout involving toe of right foot  . Discharge date was 05-16-2022. Discharged from [facility] ED at [place]?Medications Started at [facility] Discharge:?? Colchicine 0.6 mg daily PRN  Medication Changes at Hospital Discharge: None  Medications Discontinued at Hospital Discharge: None  Medications that remain the same after  Hospital Discharge:??  -All other medications will remain the same.    Star Rating Drugs:  Rosuvastatin 10 mg- Last filled 07-29-2022 90 DS [facility]. Previous 12-30-2021 [facility]  Care Gaps: Annual wellness visit in last year? Yes Covid booster overdue   [place] Clinical Pharmacist Assistant [phone]

## 2022-08-13 ENCOUNTER — Ambulatory Visit: Payer: TRICARE For Life (TFL)

## 2022-08-13 DIAGNOSIS — E782 Mixed hyperlipidemia: Secondary | ICD-10-CM

## 2022-08-13 MED ORDER — ROSUVASTATIN CALCIUM 10 MG PO TABS: 10.0000 mg | ORAL_TABLET | Freq: Every day | ORAL | 3 refills | Status: DC

## 2022-08-13 NOTE — Progress Notes (Signed)
Care Management & Coordination Services Pharmacy Note  08/13/2022 Name:  Robert Donaldson MRN:  [id number] DOB:  15-Apr-1956  Summary: Patient reports that he is doing well. He requested a   Recommendations/Changes made from today's visit: Recommend discussing the referrals that have been placed for this patient and explain how to contact the PCP team with questions or concerns   Follow up plan: Review with PCP team the medications that patient has been taking that are expired. Collaborate with PCP team to determine if patient will need to have Gout added to his current chronic conditions, of note he an ED visit for a gout flare up    Subjective: Robert Donaldson is an 67 y.o. year old male who is a primary patient of Minette Brine, [place].  The care coordination team was consulted for assistance with disease management and care coordination needs.    Engaged with patient by telephone for follow up visit.  Recent office visits: 07-29-2022 Minette Brine, [place]. FINISHED clobetasol cream.   05-10-2022 Minette Brine, FNP. Ferritin= 601.   Recent consult visits:  None   Hospital visits:  Medication Reconciliation was completed by comparing discharge summary, patient's EMR and Pharmacy list, and upon discussion with patient.   Admitted to the hospital on 07-20-2022 due to Chest pain. Discharge date was 07-20-2022. Discharged from [facility] ED at [place]?Medications Started at [facility] Discharge:?? Hydro/acetaminophen 5-325 mg 2 tablets every 4 hours PRN Reglan 10 mg every 6 hours PRN   Medication Changes at Hospital Discharge: None   Medications Discontinued at Hospital Discharge: None   Medications that remain the same after Hospital Discharge:??  -All other medications will remain the same.     Hospital visits:  Medication Reconciliation was completed by comparing discharge summary, patient's EMR and Pharmacy list, and upon discussion with patient.    Admitted to the hospital on 05-16-2022 due to Acute idiopathic gout involving toe of right foot  . Discharge date was 05-16-2022. Discharged from [facility] ED at [place]?Medications Started at [facility] Discharge:?? Colchicine 0.6 mg daily PRN   Medication Changes at Hospital Discharge: None   Medications Discontinued at Hospital Discharge: None   Medications that remain the same after Hospital Discharge:??  -All other medications will remain the same.     Objective:  Lab Results  Component Value Date   CREATININE 1.19 07/20/2022   BUN 15 07/20/2022   EGFR 65 10/07/2021   GFRNONAA >60 07/20/2022   GFRAA 73 08/06/2020   NA 136 07/20/2022   K 4.6 07/20/2022   CALCIUM 10.1 07/20/2022   CO2 24 07/20/2022   GLUCOSE 101 (H) 07/20/2022    Lab Results  Component Value Date/Time   HGBA1C 6.4 03/04/2022 12:00 AM   HGBA1C 6.4 (H) 10/07/2021 10:38 AM   HGBA1C 6.0 (H) 06/08/2021 09:36 AM    Last diabetic Eye exam: No results found for: "HMDIABEYEEXA"  Last diabetic Foot exam: No results found for: "HMDIABFOOTEX"   Lab Results  Component Value Date   CHOL 164 03/04/2022   HDL 60 03/04/2022   LDLCALC 86 03/04/2022   TRIG 88 03/04/2022   CHOLHDL 4.1 10/07/2021       Latest Ref Rng & Units 07/20/2022   12:10 PM 10/07/2021   10:38 AM 12/24/2020    2:31 PM  Hepatic Function  Total Protein 6.5 - 8.1 g/dL 8.2  6.7  7.3   Albumin 3.5 - 5.0 g/dL 4.6  4.5    AST 15 - 41 U/L '28  26  27   '$ ALT 0 - 44 U/L 22  24  35   Alk Phosphatase 38 - 126 U/L 50  65    Total Bilirubin 0.3 - 1.2 mg/dL 1.0  0.5  0.6   Bilirubin, Direct 0.0 - 0.2 mg/dL 0.2       Lab Results  Component Value Date/Time   TSH 1.030 01/21/2022 03:53 PM   TSH 1.710 12/10/2019 04:58 PM       Latest Ref Rng & Units 07/20/2022   12:10 PM 10/07/2021   10:38 AM 12/24/2020    2:31 PM  CBC  WBC 4.0 - 10.5 K/uL 11.1  6.0  8.9   Hemoglobin 13.0 - 17.0 g/dL 15.2  14.4  15.3   Hematocrit  39.0 - 52.0 % 44.5  41.9  45.5   Platelets 150 - 400 K/uL 225  231  238     Lab Results  Component Value Date/Time   VD25OH 49.6 12/10/2019 04:58 PM   VITAMINB12 614 12/10/2019 04:58 PM    Clinical ASCVD: No  The 10-year ASCVD risk score (Arnett DK, et al., 2019) is: 8.9%*   Values used to calculate the score:     Age: 52 years     Sex: Male     Is Non-Hispanic African American: Yes     Diabetic: No     Tobacco smoker: No     Systolic Blood Pressure: 123456 mmHg     Is BP treated: No     HDL Cholesterol: 60 mg/dL*     Total Cholesterol: 164 mg/dL*     * - Cholesterol units were assumed for this score calculation        07/29/2022   12:17 PM 04/12/2022    8:22 AM 01/21/2022    3:03 PM  Depression screen PHQ 2/9  Decreased Interest 0 0 0  Down, Depressed, Hopeless 0 0 0  PHQ - 2 Score 0 0 0     Social History   Tobacco Use  Smoking Status Former   Types: Cigars   Start date: 06/21/1989   Quit date: 03/26/2013   Years since quitting: 9.3  Smokeless Tobacco Never   BP Readings from Last 3 Encounters:  07/29/22 118/72  07/20/22 117/77  05/16/22 118/80   Pulse Readings from Last 3 Encounters:  07/29/22 (!) 59  07/20/22 70  05/16/22 65   Wt Readings from Last 3 Encounters:  07/29/22 176 lb (79.8 kg)  07/20/22 180 lb (81.6 kg)  05/16/22 177 lb 14.6 oz (80.7 kg)   BMI Readings from Last 3 Encounters:  07/29/22 25.99 kg/m  07/20/22 26.58 kg/m  05/16/22 27.05 kg/m    No Known Allergies  Medications Reviewed Today     Reviewed by Minette Brine, FNP (Family Nurse Practitioner) on 07/29/22 at 1245  Med List Status: <None>   Medication Order Taking? Sig Documenting Provider Last Dose Status Informant  acyclovir ointment (ZOVIRAX) 5 % [id number] Yes APPLY 1 APPLICATION TOPICALLY EVERY 3 HOURS Minette Brine, FNP Taking Active   celecoxib (CELEBREX) 200 MG capsule [id number] Yes Take 200 mg by mouth daily. Takes as needed [provider] Taking Active Self   Cholecalciferol 125 MCG (5000 UT) TABS [id number] Yes Take 125 mcg by mouth daily. Taking 1 tablet by mouth daily. [provider] Taking Active   clobetasol cream (TEMOVATE) 0.05 % [id number] Yes Apply 1 Application topically 2 (two) times daily. [provider] Taking Active   colchicine 0.6 MG tablet [id number] No Take 1 tablet (0.6 mg total) by mouth daily as needed (gout).  Patient not taking: Reported on 07/29/2022   Hayden Rasmussen, MD Not Taking Active   cyclobenzaprine (FLEXERIL) 10 MG tablet [id number] Yes Take 1 tablet (10 mg total) by mouth 3 (three) times daily as needed for muscle spasms. Minette Brine, FNP Taking Active   diclofenac Sodium (VOLTAREN) 1 % GEL [id number] Yes Apply 2 g topically 4 (four) times daily. Minette Brine, FNP Taking Active   ferrous sulfate 325 (65 FE) MG EC tablet [id number] Yes Take 1 tablet (325 mg total) by mouth 2 (two) times daily. Minette Brine, FNP Taking Active   HYDROcodone-acetaminophen [facility]) 5-325 MG tablet [id number] No Take 2 tablets by mouth every 4 (four) hours as needed.  Patient not taking: Reported on 07/29/2022   Margarita Mail, PA-C Not Taking Active   hydroxychloroquine (PLAQUENIL) 200 MG tablet [id number] Yes Take 1 tablet (200 mg total) by mouth 2 (two) times daily. Minette Brine, FNP Taking Active   meloxicam [facility]) 7.5 MG tablet [id number] Yes Take by mouth. As needed [provider] Taking Active Self  metoCLOPramide (REGLAN) 10 MG tablet [id number] No Take 1 tablet (10 mg total) by mouth every 6 (six) hours as needed for nausea (nausea/headache).  Patient not taking: Reported on 07/29/2022   Margarita Mail, PA-C Not Taking Active   Multiple Vitamins-Minerals (ADULT GUMMY PO) [id number] Yes Take 1 each by mouth in the morning, at noon, and at bedtime. FIBER [provider] Taking Active   rosuvastatin (CRESTOR) 10 MG tablet [id number] No TAKE 1 TABLET DAILY  Patient not taking: Reported on 07/29/2022    Minette Brine, FNP Not Taking Active   tamsulosin [facility]) 0.4 MG CAPS capsule [id number] Yes Take 0.4 mg by mouth at bedtime. [provider] Taking Active   vitamin E 200 UNIT capsule [id number] Yes Take 5,000 Units by mouth daily.  [provider] Taking Active             SDOH:  (Social Determinants of Health) assessments and interventions performed: No SDOH Interventions    Flowsheet Row Chronic Care Management from 04/14/2022 in [facility] Clinical Support from 08/01/2019 in [facility]  SDOH Interventions    Alcohol Usage Interventions Intervention Not Indicated (Score <7) --  Depression Interventions/Treatment  -- [id number] Score <4 Follow-up Not Indicated       Medication Assistance: None required.  Patient affirms current coverage meets needs.  Medication Access: Within the past 30 days, how often has patient missed a dose of medication? Yes-Rosuvastatin  Is a pillbox or other method used to improve adherence?  Will ask during next visit  Factors that may affect medication adherence? nonadherence to medications and lack of understanding of disease management Are meds synced by current pharmacy? No  Are meds delivered by current pharmacy? Yes  Does patient experience delays in picking up medications due to transportation concerns? Yes   [facility] Reviewed: Is patient disadvantaged to use [facility]?: Yes  Current Rx insurance plan: [employer] For Life  Name and location of Current pharmacy:  [facility] [place], [address] [place] [address] Phone: [phone] Fax: [phone]  [facility] [place], [place] [address] [place] [address]  Phone: [phone] Fax: [phone]  [facility] services reviewed with patient  today?: No  Patient requests to transfer care to [facility]?: No  Reason patient declined to change pharmacies: Not mentioned at this visit  Compliance/Adherence/Medication fill history: Care Gaps: COVID-19 Booster  Medicare Annual Wellness visit   Star-Rating Drugs: Rosuvastatin 10 mg tablet    Assessment/Plan  Hyperlipidemia: (LDL goal < 70) -Uncontrolled -Current treatment: Rosuvastatin 10 mg tablet once per day Appropriate, Effective, Safe, Accessible -Current dietary patterns: he reports eating twice per day. He rarely eats beef and usually eats chicken and fish. He rarely eats pasta. He is drinking cranberry juice and water. He is not drinking any sodas and most things that he has daily are a lot of medications.  -Current exercise habits: will discuss further during next visit  -Educated on Cholesterol goals;  Importance of limiting foods high in cholesterol; -Recommended to continue current medication   Polypharmacy(Goal: Reduce patients use of expired medication ) -Not ideally controlled -Current treatment  Celecoxib 200 mg capsule taking 1 capsule daily Query Appropriate Meloxicam 15 mg tablet once per day - filled in 2015 Query Appropriate  Meloxicam 7.5 mg tablet taking 1 tablet daily  Query Appropriate Patient reports this medication was filled on 08/08/2021 by a different specialist  Naprosyn Sodium 550 mg tablet once per day  Query Appropriate-  03/2020  -Discussed with patient the importance of not using these medications together due to horrible side effects and interactions. Also discussed with patient the importance of not using these medications because they are expired  -Collaborated with patient and discussed with patient the importance of not using old medication   Orlando Penner, CPP, PharmD Clinical Pharmacist Practitioner [facility] [phone]

## 2022-08-14 DIAGNOSIS — K5790 Diverticulosis of intestine, part unspecified, without perforation or abscess without bleeding: Secondary | ICD-10-CM | POA: Insufficient documentation

## 2022-08-17 ENCOUNTER — Encounter: Payer: Self-pay | Admitting: Nurse Practitioner

## 2022-08-18 ENCOUNTER — Encounter: Payer: Self-pay | Admitting: Nurse Practitioner

## 2022-08-18 NOTE — Telephone Encounter (Signed)
Yes he can go to [facility]

## 2022-08-19 DIAGNOSIS — M25562 Pain in left knee: Secondary | ICD-10-CM | POA: Diagnosis not present

## 2022-08-20 DIAGNOSIS — H40023 Open angle with borderline findings, high risk, bilateral: Secondary | ICD-10-CM | POA: Diagnosis not present

## 2022-08-20 DIAGNOSIS — H5203 Hypermetropia, bilateral: Secondary | ICD-10-CM | POA: Diagnosis not present

## 2022-08-20 DIAGNOSIS — H2513 Age-related nuclear cataract, bilateral: Secondary | ICD-10-CM | POA: Diagnosis not present

## 2022-08-20 DIAGNOSIS — H524 Presbyopia: Secondary | ICD-10-CM | POA: Diagnosis not present

## 2022-08-20 DIAGNOSIS — H52223 Regular astigmatism, bilateral: Secondary | ICD-10-CM | POA: Diagnosis not present

## 2022-08-20 DIAGNOSIS — H353112 Nonexudative age-related macular degeneration, right eye, intermediate dry stage: Secondary | ICD-10-CM | POA: Diagnosis not present

## 2022-08-30 DIAGNOSIS — M25562 Pain in left knee: Secondary | ICD-10-CM | POA: Diagnosis not present

## 2022-08-30 DIAGNOSIS — M25561 Pain in right knee: Secondary | ICD-10-CM | POA: Diagnosis not present

## 2022-09-01 ENCOUNTER — Ambulatory Visit (INDEPENDENT_AMBULATORY_CARE_PROVIDER_SITE_OTHER): Payer: Medicare PPO

## 2022-09-01 ENCOUNTER — Ambulatory Visit: Payer: TRICARE For Life (TFL) | Admitting: Nurse Practitioner

## 2022-09-01 VITALS — Ht 69.0 in | Wt 180.0 lb

## 2022-09-01 DIAGNOSIS — L439 Lichen planus, unspecified: Secondary | ICD-10-CM | POA: Diagnosis not present

## 2022-09-01 DIAGNOSIS — Z0000 Encounter for general adult medical examination without abnormal findings: Secondary | ICD-10-CM | POA: Diagnosis not present

## 2022-09-01 DIAGNOSIS — Z79899 Other long term (current) drug therapy: Secondary | ICD-10-CM | POA: Diagnosis not present

## 2022-09-01 DIAGNOSIS — L308 Other specified dermatitis: Secondary | ICD-10-CM | POA: Diagnosis not present

## 2022-09-01 NOTE — Progress Notes (Signed)
I connected with  Colman Cater on 09/01/22 by a audio enabled telemedicine application and verified that I am speaking with the correct person using two identifiers.  Patient Location: Home  Provider Location: Home Office  I discussed the limitations of evaluation and management by telemedicine. The patient expressed understanding and agreed to proceed.  Subjective:   Robert Donaldson is a 67 y.o. male who presents for Medicare Annual/Subsequent preventive examination.  Review of Systems     Cardiac Risk Factors include: advanced age (>64mn, >>103women);dyslipidemia;male gender     Objective:    Today's Vitals   09/01/22 0856 09/01/22 0857  Weight: 180 lb (81.6 kg)   Height: '5\' 9"'$  (1.753 m)   PainSc:  4    Body mass index is 26.58 kg/m.     09/01/2022    9:02 AM 07/20/2022   12:09 PM 05/16/2022    1:35 PM 08/13/2021    9:10 AM 08/06/2020    9:17 AM 08/01/2019    8:50 AM 07/26/2018    1:59 PM  Advanced Directives  Does Patient Have a Medical Advance Directive? Yes Yes Yes No Yes Yes   Type of AParamedicof AL[place]Living will Living will;Healthcare Power of Attorney Living will  [place]   Does patient want to make changes to medical advance directive?  No - Patient declined       Copy of HHullin Chart? No - copy requested    No - copy requested No - copy requested   Would patient like information on creating a medical advance directive?    No - Patient declined   Yes (MAU/Ambulatory/Procedural Areas - Information given)    Current Medications (verified) Outpatient Encounter Medications as of 09/01/2022  Medication Sig   acyclovir ointment (ZOVIRAX) 5 % APPLY 1 APPLICATION TOPICALLY EVERY 3 HOURS   celecoxib (CELEBREX) 200 MG capsule Take 200 mg by mouth daily. Takes as needed   Cholecalciferol 125 MCG (5000 UT) TABS Take 125 mcg by mouth daily. Taking  1 tablet by mouth daily.   clobetasol cream (TEMOVATE) 0AB-123456789% Apply 1 Application topically 2 (two) times daily.   cyclobenzaprine (FLEXERIL) 10 MG tablet Take 1 tablet (10 mg total) by mouth 3 (three) times daily as needed for muscle spasms.   diclofenac Sodium (VOLTAREN) 1 % GEL Apply 2 g topically 4 (four) times daily.   ferrous sulfate 325 (65 FE) MG EC tablet Take 1 tablet (325 mg total) by mouth 2 (two) times daily.   hydroxychloroquine (PLAQUENIL) 200 MG tablet Take 1 tablet (200 mg total) by mouth 2 (two) times daily.   meloxicam (MOBIC) 7.5 MG tablet Take by mouth. As needed   Multiple Vitamins-Minerals (ADULT GUMMY PO) Take 1 each by mouth in the morning, at noon, and at bedtime. FIBER   rosuvastatin (CRESTOR) 10 MG tablet Take 1 tablet (10 mg total) by mouth daily.   tamsulosin (FLOMAX) 0.4 MG CAPS capsule Take 0.4 mg by mouth at bedtime.   vitamin E 200 UNIT capsule Take 5,000 Units by mouth daily.    colchicine 0.6 MG tablet Take 1 tablet (0.6 mg total) by mouth daily as needed (gout). (Patient not taking: Reported on 07/29/2022)   HYDROcodone-acetaminophen (NORCO) 5-325 MG tablet Take 2 tablets by mouth every 4 (four) hours as needed. (Patient not taking: Reported on 07/29/2022)   metoCLOPramide (REGLAN) 10 MG tablet Take 1 tablet (10 mg total) by  mouth every 6 (six) hours as needed for nausea (nausea/headache). (Patient not taking: Reported on 07/29/2022)   No facility-administered encounter medications on file as of 09/01/2022.    Allergies (verified) Patient has no known allergies.   History: Past Medical History:  Diagnosis Date   BPH (benign prostatic hyperplasia)    HLD (hyperlipidemia)    Pancreatitis    Vitamin D deficiency    Past Surgical History:  Procedure Laterality Date   KNEE SURGERY     Family History  Problem Relation Age of Onset   Hyperlipidemia Sister    Diabetes Paternal Grandmother    Social History   Socioeconomic History   Marital status:  Married    Spouse name: Not on file   Number of children: Not on file   Years of education: Not on file   Highest education level: Not on file  Occupational History   Occupation: retired  Tobacco Use   Smoking status: Former    Types: Cigars    Start date: 06/21/1989    Quit date: 03/26/2013    Years since quitting: 9.4   Smokeless tobacco: Never   Tobacco comments:    Quit cigarettes in 1978, started cigars around 2004  Vaping Use   Vaping Use: Never used  Substance and Sexual Activity   Alcohol use: Not Currently    Comment: occassionally   Drug use: No   Sexual activity: Yes    Birth control/protection: Abstinence, Post-menopausal  Other Topics Concern   Not on file  Social History Narrative   Not on file   Social Determinants of Health   Financial Resource Strain: Low Risk  (09/01/2022)   Overall Financial Resource Strain (CARDIA)    Difficulty of Paying Living Expenses: Not hard at all  Food Insecurity: No Food Insecurity (09/01/2022)   Hunger Vital Sign    Worried About Running Out of Food in the Last Year: Never true    [place] in the Last Year: Never true  Transportation Needs: No Transportation Needs (09/01/2022)   PRAPARE - Hydrologist (Medical): No    Lack of Transportation (Non-Medical): No  Physical Activity: Sufficiently Active (09/01/2022)   Exercise Vital Sign    Days of Exercise per Week: 4 days    Minutes of Exercise per Session: 60 min  Stress: No Stress Concern Present (09/01/2022)   [place]    Feeling of Stress : Not at all  Social Connections: Not on file    Tobacco Counseling Counseling given: Not Answered Tobacco comments: Quit cigarettes in 1978, started cigars around 2004   Clinical Intake:  Pre-visit preparation completed: Yes  Pain : 0-10 Pain Score: 4  Pain Type: Chronic pain Pain Location: Back Pain Orientation: Lower Pain  Descriptors / Indicators: Aching Pain Onset: More than a month ago Pain Frequency: Constant     Nutritional Status: BMI 25 -29 Overweight Nutritional Risks: None Diabetes: No  How often do you need to have someone help you when you read instructions, pamphlets, or other written materials from your doctor or pharmacy?: 1 - Never  Diabetic? no  Interpreter Needed?: No  Information entered by :: NAllen LPN   Activities of Daily Living    09/01/2022    9:02 AM  In your present state of health, do you have any difficulty performing the following activities:  Hearing? 0  Vision? 0  Difficulty concentrating or making decisions? 0  Walking or climbing stairs? 0  Dressing or bathing? 0  Doing errands, shopping? 0  Preparing Food and eating ? N  Using the Toilet? N  In the past six months, have you accidently leaked urine? Y  Do you have problems with loss of bowel control? N  Managing your Medications? N  Managing your Finances? N  Housekeeping or managing your Housekeeping? N    Patient Care Team: Minette Brine, FNP as PCP - General ([place]) Lavonna Monarch, MD (Inactive) as Consulting Physician (Dermatology) Mayford Knife, [facility] (Pharmacist)  Indicate any recent Medical Services you may have received from other than [facility] providers in the past year (date may be approximate).     Assessment:   This is a routine wellness examination for [facility].  Hearing/Vision screen Vision Screening - Comments:: Regular eye exams, [place]  Dietary issues and exercise activities discussed: Current Exercise Habits: Home exercise routine, Type of exercise: calisthenics;strength training/weights;stretching, Time (Minutes): 60, Frequency (Times/Week): 4, Weekly Exercise (Minutes/Week): 240   Goals Addressed             This Visit's Progress    Patient Stated       09/01/2022, maintain tone and definition, keep bones strong       Depression Screen    09/01/2022     9:02 AM 07/29/2022   12:17 PM 04/12/2022    8:22 AM 01/21/2022    3:03 PM 08/13/2021    9:11 AM 08/06/2020    9:18 AM 08/01/2019    8:51 AM  PHQ 2/9 Scores  PHQ - 2 Score 0 0 0 0 0 0 0  PHQ- 9 Score       0    Fall Risk    09/01/2022    9:02 AM 07/29/2022   12:15 PM 04/12/2022    8:22 AM 01/21/2022    3:03 PM 08/13/2021    9:11 AM  [place] in the past year? 0 0 0 0 0  Number falls in past yr: 0  0 0   Injury with Fall? 0  0 0   Risk for fall due to : Medication side effect  No Fall Risks No Fall Risks Impaired balance/gait  Follow up Falls prevention discussed;Education provided;Falls evaluation completed  Falls evaluation completed Falls evaluation completed Falls evaluation completed;Education provided;Falls prevention discussed    FALL RISK PREVENTION PERTAINING TO THE HOME:  Any stairs in or around the home? Yes  If so, are there any without handrails? No  Home free of loose throw rugs in walkways, pet beds, electrical cords, etc? Yes  Adequate lighting in your home to reduce risk of falls? Yes   ASSISTIVE DEVICES UTILIZED TO PREVENT FALLS:  Life alert? No  Use of a cane, walker or w/c? No  Grab bars in the bathroom? Yes  Shower chair or bench in shower? No  Elevated toilet seat or a handicapped toilet? No   TIMED UP AND GO:  Was the test performed? No .  .     Cognitive Function:    07/26/2018   11:42 AM  MMSE - Mini Mental State Exam  Orientation to time 5  Orientation to Place 5  Registration 3  Attention/ Calculation 5  Recall 3  Language- name 2 objects 2  Language- repeat 1  Language- follow 3 step command 3  Language- read & follow direction 1  Write a sentence 1        09/01/2022  9:03 AM 08/06/2020    9:19 AM 08/01/2019    8:52 AM  6CIT Screen  What Year? 0 points 0 points 0 points  What month? 0 points 0 points 0 points  What time? 0 points 0 points 0 points  Count back from 20 0 points 0 points 0 points  Months in reverse 2  points 0 points 0 points  Repeat phrase 2 points 0 points 0 points  Total Score 4 points 0 points 0 points    Immunizations Immunization History  Administered Date(s) Administered   Fluad Quad(high Dose 65+) 04/12/2022   Hepatitis A, Adult 05/03/1995, 05/13/1996   Influenza Whole 05/03/1995, 05/13/1996, 03/29/1997   Influenza, High Dose Seasonal PF 05/22/2019   Influenza-Unspecified 04/01/2009, 06/05/2010, 05/03/2012, 05/03/2013, 05/03/2014   OPV 04/21/1978   PFIZER(Purple Top)SARS-COV-2 Vaccination 07/16/2019, 08/06/2019, 04/14/2020   PNEUMOCOCCAL CONJUGATE-20 08/13/2021   Pfizer Covid-19 Vaccine Bivalent Booster 83yr & up 03/31/2021   Pneumococcal Polysaccharide-23 07/26/2018   Smallpox 01/20/1980   Td 11/19/1988   Tdap 04/01/2009, 04/06/2015   Typhoid Parenteral, AKD ([place] 07/22/1988   Yellow Fever 01/19/1977   Zoster Recombinat (Shingrix) 12/20/2019, 03/04/2020    TDAP status: Up to date  Flu Vaccine status: Up to date  Pneumococcal vaccine status: Up to date  Covid-19 vaccine status: Completed vaccines  Qualifies for Shingles Vaccine? Yes   Zostavax completed Yes   Shingrix Completed?: Yes  Screening Tests Health Maintenance  Topic Date Due   COVID-19 Vaccine (5 - 2023-24 season) 02/19/2022   Medicare Annual Wellness (AWV)  08/13/2022   COLONOSCOPY (Pts 45-410yrInsurance coverage will need to be confirmed)  07/22/2024   DTaP/Tdap/Td (4 - Td or Tdap) 04/05/2025   Pneumonia Vaccine 69Years old  Completed   INFLUENZA VACCINE  Completed   Hepatitis C Screening  Completed   Zoster Vaccines- Shingrix  Completed   HPV VACCINES  Aged Out    Health Maintenance  Health Maintenance Due  Topic Date Due   COVID-19 Vaccine (5 - 2023-24 season) 02/19/2022   Medicare Annual Wellness (AWV)  08/13/2022    Colorectal cancer screening: Type of screening: Colonoscopy. Completed 07/22/2014. Repeat every 10 years  Lung Cancer Screening: (Low Dose CT Chest  recommended if Age 743-80ears, 30 pack-year currently smoking OR have quit w/in 15years.) does not qualify.   Lung Cancer Screening Referral: no  Additional Screening:  Hepatitis C Screening: does qualify; Completed 12/24/2020  Vision Screening: Recommended annual ophthalmology exams for early detection of glaucoma and other disorders of the eye. Is the patient up to date with their annual eye exam?  Yes  Who is the provider or what is the name of the office in which the patient attends annual eye exams? [facility] is not established with a provider, would they like to be referred to a provider to establish care? No .   Dental Screening: Recommended annual dental exams for proper oral hygiene  Community Resource Referral / Chronic Care Management: CRR required this visit?  No   CCM required this visit?  No      Plan:     I have personally reviewed and noted the following in the patient's chart:   Medical and social history Use of alcohol, tobacco or illicit drugs  Current medications and supplements including opioid prescriptions. Patient is not currently taking opioid prescriptions. Functional ability and status Nutritional status Physical activity Advanced directives List of other physicians Hospitalizations, surgeries, and ER visits in previous 12 months  Vitals Screenings to include cognitive, depression, and falls Referrals and appointments  In addition, I have reviewed and discussed with patient certain preventive protocols, quality metrics, and best practice recommendations. A written personalized care plan for preventive services as well as general preventive health recommendations were provided to patient.     Kellie Simmering, LPN   [id number]   Nurse Notes: none  Due to this being a virtual visit, the after visit summary with patients personalized plan was offered to patient via mail or my-chart. Patient would like to access on my-chart

## 2022-09-01 NOTE — Patient Instructions (Signed)
[name] , Thank you for taking time to come for your Medicare Wellness Visit. I appreciate your ongoing commitment to your health goals. Please review the following plan we discussed and let me know if I can assist you in the future.   These are the goals we discussed:  Goals      Manage My Medicine     Timeframe:  Long-Range Goal Priority:  High Start Date:                             Expected End Date:                       Follow Up Date 06/2021    - call for medicine refill 2 or 3 days before it runs out - call if I am sick and can't take my medicine - keep a list of all the medicines I take; vitamins and herbals too - learn to read medicine labels    Why is this important?   These steps will help you keep on track with your medicines.        Patient Stated     08/01/2019 wants to lose body fat     Patient Stated     08/06/2020, wants to lose fat     Patient Stated     08/13/2021, lose belly fat     Patient Stated     09/01/2022, maintain tone and definition, keep bones strong     Weight (lb) < 170 lb (77.1 kg)     I would like to lose my belly fat        This is a list of the screening recommended for you and due dates:  Health Maintenance  Topic Date Due   COVID-19 Vaccine (5 - 2023-24 season) 02/19/2022   Medicare Annual Wellness Visit  09/01/2023   Colon Cancer Screening  07/22/2024   DTaP/Tdap/Td vaccine (4 - Td or Tdap) 04/05/2025   Pneumonia Vaccine  Completed   Flu Shot  Completed   Hepatitis C Screening: USPSTF Recommendation to screen - Ages 36-79 yo.  Completed   Zoster (Shingles) Vaccine  Completed   HPV Vaccine  Aged Out    Advanced directives: Please bring a copy of your POA (Power of [place]) and/or Living Will to your next appointment.   Conditions/risks identified: none  Next appointment: Follow up in one year for your annual wellness visit.   Preventive Care 62 Years and Older, Male  Preventive care refers to lifestyle choices  and visits with your health care provider that can promote health and wellness. What does preventive care include? A yearly physical exam. This is also called an annual well check. Dental exams once or twice a year. Routine eye exams. Ask your health care provider how often you should have your eyes checked. Personal lifestyle choices, including: Daily care of your teeth and gums. Regular physical activity. Eating a healthy diet. Avoiding tobacco and drug use. Limiting alcohol use. Practicing safe sex. Taking low doses of aspirin every day. Taking vitamin and mineral supplements as recommended by your health care provider. What happens during an annual well check? The services and screenings done by your health care provider during your annual well check will depend on your age, overall health, lifestyle risk factors, and family history of disease. Counseling  Your health care provider may ask you questions about your: Alcohol use. Tobacco use.  Drug use. Emotional well-being. Home and relationship well-being. Sexual activity. Eating habits. History of falls. Memory and ability to understand (cognition). Work and work Statistician. Screening  You may have the following tests or measurements: Height, weight, and BMI. Blood pressure. Lipid and cholesterol levels. These may be checked every 5 years, or more frequently if you are over 99 years old. Skin check. Lung cancer screening. You may have this screening every year starting at age 71 if you have a 30-pack-year history of smoking and currently smoke or have quit within the past 15 years. Fecal occult blood test (FOBT) of the stool. You may have this test every year starting at age 69. Flexible sigmoidoscopy or colonoscopy. You may have a sigmoidoscopy every 5 years or a colonoscopy every 10 years starting at age 82. Prostate cancer screening. Recommendations will vary depending on your family history and other risks. Hepatitis C  blood test. Hepatitis B blood test. Sexually transmitted disease (STD) testing. Diabetes screening. This is done by checking your blood sugar (glucose) after you have not eaten for a while (fasting). You may have this done every 1-3 years. Abdominal aortic aneurysm (AAA) screening. You may need this if you are a current or former smoker. Osteoporosis. You may be screened starting at age 30 if you are at high risk. Talk with your health care provider about your test results, treatment options, and if necessary, the need for more tests. Vaccines  Your health care provider may recommend certain vaccines, such as: Influenza vaccine. This is recommended every year. Tetanus, diphtheria, and acellular pertussis (Tdap, Td) vaccine. You may need a Td booster every 10 years. Zoster vaccine. You may need this after age 10. Pneumococcal 13-valent conjugate (PCV13) vaccine. One dose is recommended after age 83. Pneumococcal polysaccharide (PPSV23) vaccine. One dose is recommended after age 82. Talk to your health care provider about which screenings and vaccines you need and how often you need them. This information is not intended to replace advice given to you by your health care provider. Make sure you discuss any questions you have with your health care provider. Document Released: 07/04/2015 Document Revised: 02/25/2016 Document Reviewed: 04/08/2015 Elsevier Interactive Patient Education  2017 [place] Prevention in the Home Falls can cause injuries. They can happen to people of all ages. There are many things you can do to make your home safe and to help prevent falls. What can I do on the outside of my home? Regularly fix the edges of walkways and driveways and fix any cracks. Remove anything that might make you trip as you walk through a door, such as a raised step or threshold. Trim any bushes or trees on the path to your home. Use bright outdoor lighting. Clear any walking paths of  anything that might make someone trip, such as rocks or tools. Regularly check to see if handrails are loose or broken. Make sure that both sides of any steps have handrails. Any raised decks and porches should have guardrails on the edges. Have any leaves, snow, or ice cleared regularly. Use sand or salt on walking paths during winter. Clean up any spills in your garage right away. This includes oil or grease spills. What can I do in the bathroom? Use night lights. Install grab bars by the toilet and in the tub and shower. Do not use towel bars as grab bars. Use non-skid mats or decals in the tub or shower. If you need to sit down in the shower,  use a plastic, non-slip stool. Keep the floor dry. Clean up any water that spills on the floor as soon as it happens. Remove soap buildup in the tub or shower regularly. Attach bath mats securely with double-sided non-slip rug tape. Do not have throw rugs and other things on the floor that can make you trip. What can I do in the bedroom? Use night lights. Make sure that you have a light by your bed that is easy to reach. Do not use any sheets or blankets that are too big for your bed. They should not hang down onto the floor. Have a firm chair that has side arms. You can use this for support while you get dressed. Do not have throw rugs and other things on the floor that can make you trip. What can I do in the kitchen? Clean up any spills right away. Avoid walking on wet floors. Keep items that you use a lot in easy-to-reach places. If you need to reach something above you, use a strong step stool that has a grab bar. Keep electrical cords out of the way. Do not use floor polish or wax that makes floors slippery. If you must use wax, use non-skid floor wax. Do not have throw rugs and other things on the floor that can make you trip. What can I do with my stairs? Do not leave any items on the stairs. Make sure that there are handrails on both  sides of the stairs and use them. Fix handrails that are broken or loose. Make sure that handrails are as long as the stairways. Check any carpeting to make sure that it is firmly attached to the stairs. Fix any carpet that is loose or worn. Avoid having throw rugs at the top or bottom of the stairs. If you do have throw rugs, attach them to the floor with carpet tape. Make sure that you have a light switch at the top of the stairs and the bottom of the stairs. If you do not have them, ask someone to add them for you. What else can I do to help prevent falls? Wear shoes that: Do not have high heels. Have rubber bottoms. Are comfortable and fit you well. Are closed at the toe. Do not wear sandals. If you use a stepladder: Make sure that it is fully opened. Do not climb a closed stepladder. Make sure that both sides of the stepladder are locked into place. Ask someone to hold it for you, if possible. Clearly mark and make sure that you can see: Any grab bars or handrails. First and last steps. Where the edge of each step is. Use tools that help you move around (mobility aids) if they are needed. These include: Canes. Walkers. Scooters. Crutches. Turn on the lights when you go into a dark area. Replace any light bulbs as soon as they burn out. Set up your furniture so you have a clear path. Avoid moving your furniture around. If any of your floors are uneven, fix them. If there are any pets around you, be aware of where they are. Review your medicines with your doctor. Some medicines can make you feel dizzy. This can increase your chance of falling. Ask your doctor what other things that you can do to help prevent falls. This information is not intended to replace advice given to you by your health care provider. Make sure you discuss any questions you have with your health care provider. Document Released: 04/03/2009 Document Revised:  11/13/2015 Document Reviewed: 07/12/2014 Elsevier  Interactive Patient Education  2017 [employer].

## 2022-09-02 DIAGNOSIS — L439 Lichen planus, unspecified: Secondary | ICD-10-CM | POA: Diagnosis not present

## 2022-09-02 DIAGNOSIS — Z79899 Other long term (current) drug therapy: Secondary | ICD-10-CM | POA: Diagnosis not present

## 2022-09-02 DIAGNOSIS — L309 Dermatitis, unspecified: Secondary | ICD-10-CM | POA: Insufficient documentation

## 2022-09-02 DIAGNOSIS — L308 Other specified dermatitis: Secondary | ICD-10-CM | POA: Diagnosis not present

## 2022-10-12 ENCOUNTER — Ambulatory Visit (INDEPENDENT_AMBULATORY_CARE_PROVIDER_SITE_OTHER): Payer: Medicare PPO | Admitting: Nurse Practitioner

## 2022-10-12 ENCOUNTER — Encounter: Payer: Self-pay | Admitting: Nurse Practitioner

## 2022-10-12 ENCOUNTER — Telehealth: Payer: Self-pay

## 2022-10-12 VITALS — BP 102/72 | HR 60 | Temp 98.0°F | Ht 69.0 in | Wt 174.0 lb

## 2022-10-12 DIAGNOSIS — Z125 Encounter for screening for malignant neoplasm of prostate: Secondary | ICD-10-CM | POA: Diagnosis not present

## 2022-10-12 DIAGNOSIS — Z0000 Encounter for general adult medical examination without abnormal findings: Secondary | ICD-10-CM

## 2022-10-12 DIAGNOSIS — J301 Allergic rhinitis due to pollen: Secondary | ICD-10-CM | POA: Diagnosis not present

## 2022-10-12 DIAGNOSIS — R7309 Other abnormal glucose: Secondary | ICD-10-CM | POA: Diagnosis not present

## 2022-10-12 DIAGNOSIS — E782 Mixed hyperlipidemia: Secondary | ICD-10-CM

## 2022-10-12 DIAGNOSIS — D508 Other iron deficiency anemias: Secondary | ICD-10-CM | POA: Diagnosis not present

## 2022-10-12 MED ORDER — LORATADINE 10 MG PO TABS: 10.0000 mg | ORAL_TABLET | Freq: Every day | ORAL | 1 refills | Status: DC

## 2022-10-12 NOTE — Telephone Encounter (Signed)
Patient would like to reschedule appointment to the afternoon after golf. Appointment scheduled to 4pm on Tuesday.  Cherylin Mylar, CPP, PharmD Clinical Pharmacist Practitioner [facility] [phone]

## 2022-10-12 NOTE — Progress Notes (Signed)
I,Sheena H Holbrook,acting as a Neurosurgeon for Arnette Felts, FNP.,have documented all relevant documentation on the behalf of Arnette Felts, FNP,as directed by  Arnette Felts, FNP while in the presence of Arnette Felts, FNP.   Subjective:     Patient ID: Robert Donaldson , male    DOB: 04/25/1956 , 67 y.o.   MRN: [id number]   Chief Complaint  Patient presents with   Annual Exam    HPI  Patient presents today for annual exam. Continues to be followed by Dr. Virgil Benedict in Dermatology - no changes. Patient is concerned about his testosterone levels.        Past Medical History:  Diagnosis Date   BPH (benign prostatic hyperplasia)    HLD (hyperlipidemia)    Pancreatitis    Vitamin D deficiency      Family History  Problem Relation Age of Onset   Hyperlipidemia Sister    Diabetes Paternal Grandmother      Current Outpatient Medications:    acyclovir ointment (ZOVIRAX) 5 %, APPLY 1 APPLICATION TOPICALLY EVERY 3 HOURS, Disp: 30 g, Rfl: 11   celecoxib (CELEBREX) 200 MG capsule, Take 200 mg by mouth daily. Takes as needed, Disp: , Rfl:    Cholecalciferol 125 MCG (5000 UT) TABS, Take 125 mcg by mouth daily. Taking 1 tablet by mouth daily., Disp: , Rfl:    cyclobenzaprine (FLEXERIL) 10 MG tablet, Take 1 tablet (10 mg total) by mouth 3 (three) times daily as needed for muscle spasms., Disp: 30 tablet, Rfl: 0   diclofenac Sodium (VOLTAREN) 1 % GEL, Apply 2 g topically 4 (four) times daily., Disp: 100 g, Rfl: 2   ferrous sulfate 325 (65 FE) MG EC tablet, Take 1 tablet (325 mg total) by mouth 2 (two) times daily., Disp: 180 tablet, Rfl: 1   HYDROcodone-acetaminophen (NORCO) 5-325 MG tablet, Take 2 tablets by mouth every 4 (four) hours as needed., Disp: 10 tablet, Rfl: 0   hydroxychloroquine (PLAQUENIL) 200 MG tablet, Take 1 tablet (200 mg total) by mouth 2 (two) times daily., Disp: 60 tablet, Rfl: 0   loratadine (CLARITIN) 10 MG tablet, Take 1 tablet (10 mg total) by mouth daily., Disp: 90  tablet, Rfl: 1   meloxicam (MOBIC) 7.5 MG tablet, Take by mouth. As needed, Disp: , Rfl:    Multiple Vitamins-Minerals (ADULT GUMMY PO), Take 1 each by mouth in the morning, at noon, and at bedtime. FIBER, Disp: , Rfl:    OPZELURA 1.5 % CREA, , Disp: , Rfl:    rosuvastatin (CRESTOR) 10 MG tablet, Take 1 tablet (10 mg total) by mouth daily., Disp: 95 tablet, Rfl: 3   tamsulosin (FLOMAX) 0.4 MG CAPS capsule, Take 0.4 mg by mouth at bedtime., Disp: , Rfl:    vitamin E 200 UNIT capsule, Take 5,000 Units by mouth daily. , Disp: , Rfl:    colchicine 0.6 MG tablet, Take 1 tablet (0.6 mg total) by mouth daily as needed (gout). (Patient not taking: Reported on 07/29/2022), Disp: 20 tablet, Rfl: 0   metoCLOPramide (REGLAN) 10 MG tablet, Take 1 tablet (10 mg total) by mouth every 6 (six) hours as needed for nausea (nausea/headache). (Patient not taking: Reported on 10/12/2022), Disp: 6 tablet, Rfl: 0   No Known Allergies   Men's preventive visit. Patient Health Questionnaire (PHQ-2) is  Flowsheet Row Office Visit from 10/12/2022 in [facility]  PHQ-2 Total Score 0      Patient is on a Regular diet; low fat/low salt. Exercises  5 days a week - golf 2 days a week and gym 3 days a week.  Marital status: Married. Relevant history for alcohol use is:  Social History   Substance and Sexual Activity  Alcohol Use Not Currently   Comment: occassionally   Relevant history for tobacco use is:  Social History   Tobacco Use  Smoking Status Former   Types: Cigars   Start date: 06/21/1989   Quit date: 03/26/2013   Years since quitting: 9.5  Smokeless Tobacco Never  Tobacco Comments   Quit cigarettes in 1978, started cigars around 2004    Review of Systems  HENT:  Positive for postnasal drip and rhinorrhea.   All other systems reviewed and are negative.    Today's Vitals   10/12/22 0846  BP: 102/72  Pulse: 60  Temp: 98 F (36.7 C)  TempSrc: Oral  SpO2: 97%  Weight:  174 lb (78.9 kg)  Height: 5\' 9"  (1.753 m)   Body mass index is 25.7 kg/m.   Objective:  Physical Exam Vitals reviewed.  Constitutional:      General: He is not in acute distress.    Appearance: Normal appearance.  HENT:     Head: Normocephalic and atraumatic.     Right Ear: Tympanic membrane, ear canal and external ear normal. There is no impacted cerumen.     Left Ear: Tympanic membrane, ear canal and external ear normal. There is no impacted cerumen.     Nose: Nose normal.     Mouth/Throat:     Mouth: Mucous membranes are moist.  Eyes:     Extraocular Movements: Extraocular movements intact.     Conjunctiva/sclera: Conjunctivae normal.     Pupils: Pupils are equal, round, and reactive to light.  Cardiovascular:     Rate and Rhythm: Normal rate and regular rhythm.     Pulses: Normal pulses.     Heart sounds: Normal heart sounds. No murmur heard. Pulmonary:     Effort: Pulmonary effort is normal. No respiratory distress.     Breath sounds: Normal breath sounds. No wheezing.  Abdominal:     General: Abdomen is flat. Bowel sounds are normal. There is no distension.     Palpations: Abdomen is soft.     Tenderness: There is no abdominal tenderness.  Genitourinary:    Comments: Deferred - has a referral to Urology Musculoskeletal:        General: Normal range of motion.     Cervical back: Normal range of motion and neck supple.  Skin:    General: Skin is warm.     Capillary Refill: Capillary refill takes less than 2 seconds.  Neurological:     General: No focal deficit present.     Mental Status: He is alert and oriented to person, place, and time.     Cranial Nerves: No cranial nerve deficit.     Motor: No weakness.  Psychiatric:        Mood and Affect: Mood normal.        Behavior: Behavior normal.        Thought Content: Thought content normal.        Judgment: Judgment normal.         Assessment And Plan:    1. Encounter for annual physical exam Behavior  modifications discussed and diet history reviewed.   Pt will continue to exercise regularly and modify diet with low GI, plant based foods and decrease intake of processed foods.  Recommend intake of daily  multivitamin, Vitamin D, and calcium.  Recommend colonoscopy (up to date) for preventive screenings, as well as recommend immunizations that include influenza, TDAP - CBC - CMP14+EGFR  2. Encounter for prostate cancer screening Will check PSA, he is followed by Urology - PSA  3. Iron deficiency anemia secondary to inadequate dietary iron intake Comments: Continue iron supplement and stool softner as needed. - CBC - Iron, TIBC and Ferritin Panel  4. Mixed hyperlipidemia Comments: Cholesterol levels are stable.  Continue statin.  Tolerating well. - Lipid panel  5. Abnormal glucose Comments: Diet controlled.  Continue focusing on diet low in sugar and carbs. - Hemoglobin A1c  6. Seasonal allergic rhinitis due to pollen Comments: Rx for loratadine and avoid pollen enoucouraged to wear mask when outside - loratadine (CLARITIN) 10 MG tablet; Take 1 tablet (10 mg total) by mouth daily.  Dispense: 90 tablet; Refill: 1   Patient was given opportunity to ask questions. Patient verbalized understanding of the plan and was able to repeat key elements of the plan. All questions were answered to their satisfaction.   Arnette Felts, FNP   I, Arnette Felts, FNP, have reviewed all documentation for this visit. The documentation on 10/12/22 for the exam, diagnosis, procedures, and orders are all accurate and complete.   THE PATIENT IS ENCOURAGED TO PRACTICE SOCIAL DISTANCING DUE TO THE COVID-19 PANDEMIC.

## 2022-10-13 LAB — CBC
Hematocrit: 42.5 % (ref 37.5–51.0)
Hemoglobin: 14.3 g/dL (ref 13.0–17.7)
MCH: 30.6 pg (ref 26.6–33.0)
MCHC: 33.6 g/dL (ref 31.5–35.7)
MCV: 91 fL (ref 79–97)
Platelets: 215 10*3/uL (ref 150–450)
RBC: 4.68 x10E6/uL (ref 4.14–5.80)
RDW: 13.2 % (ref 11.6–15.4)
WBC: 6.3 10*3/uL (ref 3.4–10.8)

## 2022-10-13 LAB — HEMOGLOBIN A1C
Est. average glucose Bld gHb Est-mCnc: 128 mg/dL
Hgb A1c MFr Bld: 6.1 % — ABNORMAL HIGH (ref 4.8–5.6)

## 2022-10-13 LAB — CMP14+EGFR
ALT: 30 IU/L (ref 0–44)
AST: 25 IU/L (ref 0–40)
Albumin/Globulin Ratio: 2.1 (ref 1.2–2.2)
Albumin: 4.7 g/dL (ref 3.9–4.9)
Alkaline Phosphatase: 63 IU/L (ref 44–121)
BUN/Creatinine Ratio: 13 (ref 10–24)
BUN: 15 mg/dL (ref 8–27)
Bilirubin Total: 0.6 mg/dL (ref 0.0–1.2)
CO2: 22 mmol/L (ref 20–29)
Calcium: 9.7 mg/dL (ref 8.6–10.2)
Chloride: 101 mmol/L (ref 96–106)
Creatinine, Ser: 1.15 mg/dL (ref 0.76–1.27)
Globulin, Total: 2.2 g/dL (ref 1.5–4.5)
Glucose: 95 mg/dL (ref 70–99)
Potassium: 4.6 mmol/L (ref 3.5–5.2)
Sodium: 139 mmol/L (ref 134–144)
Total Protein: 6.9 g/dL (ref 6.0–8.5)
eGFR: 69 mL/min/{1.73_m2} (ref >59)

## 2022-10-13 LAB — IRON,TIBC AND FERRITIN PANEL
Ferritin: 665 ng/mL — ABNORMAL HIGH (ref 30–400)
Iron Saturation: 36 % (ref 15–55)
Iron: 113 ug/dL (ref 38–169)
Total Iron Binding Capacity: 312 ug/dL (ref 250–450)
UIBC: 199 ug/dL (ref 111–343)

## 2022-10-13 LAB — LIPID PANEL
Chol/HDL Ratio: 2.8 ratio (ref 0.0–5.0)
Cholesterol, Total: 196 mg/dL (ref 100–199)
HDL: 70 mg/dL (ref >39)
LDL Chol Calc (NIH): 107 mg/dL — ABNORMAL HIGH (ref 0–99)
Triglycerides: 107 mg/dL (ref 0–149)
VLDL Cholesterol Cal: 19 mg/dL (ref 5–40)

## 2022-10-13 LAB — PSA: Prostate Specific Ag, Serum: 0.5 ng/mL (ref 0.0–4.0)

## 2022-10-15 ENCOUNTER — Telehealth: Payer: Self-pay

## 2022-10-15 NOTE — Progress Notes (Signed)
Care Management & Coordination Services Pharmacy Team  Reason for Encounter: Appointment Reminder  Contacted patient to confirm telephone appointment with Cherylin Mylar, PharmD on 10-19-2022 at 4:00. Spoke with patient on 10/15/2022   Do you have any problems getting your medications? No  What is your top health concern you would like to discuss at your upcoming visit? Patient stated none  Have you seen any other providers since your last visit with PCP? No   Chart review: Recent office visits:  10-12-2022 Arnette Felts, FNP. A1C= 6.1. Ferritin= 665. LDL= 107. Start loratadine 10 mg daily.  09-01-2022 Barb Merino, LPN. Medicare wellness visit.  Recent consult visits:  09-01-2022 Basilio Cairo, MD  (Dermatology). Stop plaquenil and temovate.  Hospital visits:  Medication Reconciliation was completed by comparing discharge summary, patient's EMR and Pharmacy list, and upon discussion with patient.   Admitted to the hospital on 07-20-2022 due to Chest pain. Discharge date was 07-20-2022. Discharged from [facility] ED at [facility].     New?Medications Started at [facility] Discharge:?? Hydro/acetaminophen 5-325 mg 2 tablets every 4 hours PRN Reglan 10 mg every 6 hours PRN   Medication Changes at Hospital Discharge: None   Medications Discontinued at Hospital Discharge: None   Medications that remain the same after Hospital Discharge:??  -All other medications will remain the same.     Hospital visits:  Medication Reconciliation was completed by comparing discharge summary, patient's EMR and Pharmacy list, and upon discussion with patient.   Admitted to the hospital on 05-16-2022 due to Acute idiopathic gout involving toe of right foot  . Discharge date was 05-16-2022. Discharged from [facility] ED at [facility].     New?Medications Started at [facility] Discharge:?? Colchicine 0.6 mg daily PRN   Medication Changes at Hospital  Discharge: None   Medications Discontinued at Hospital Discharge: None   Medications that remain the same after Hospital Discharge:??  -All other medications will remain the same.     Star Rating Drugs:  Rosuvastatin 10 mg- Last filled 07-29-2022 90 DS [facility]. Previous 12-30-2021 [facility]   Care Gaps: Annual wellness visit in last year? Yes Covid booster overdue   Huey Romans [facility] Clinical Pharmacist Assistant [phone]

## 2022-10-19 ENCOUNTER — Ambulatory Visit: Payer: TRICARE For Life (TFL)

## 2022-10-19 ENCOUNTER — Encounter: Payer: Self-pay | Admitting: Nurse Practitioner

## 2022-10-19 MED ORDER — ROSUVASTATIN CALCIUM 20 MG PO TABS: 20.0000 mg | ORAL_TABLET | Freq: Every day | ORAL | 1 refills | Status: DC

## 2022-10-19 NOTE — Progress Notes (Addendum)
Care Management & Coordination Services Pharmacy Note  10/19/2022 Name:  Robert Donaldson MRN:  [id number] DOB:  November 28, 1955  Summary: Patient reports that he has been doing well.   Recommendations/Changes made from today's visit: Recommend patient increase Rosuvastatin to 20 mg tablet daily  Patient to go to the gym for stretching.    Follow up plan: Patient to start taking Rosuvastatin 20 mg tablet once per day. Patient    Subjective: Robert Donaldson is an 67 y.o. year old male who is a primary patient of Arnette Felts, FNP.  The care coordination team was consulted for assistance with disease management and care coordination needs.    Engaged with patient by telephone for follow up visit.  Recent office visits:  10-12-2022 Arnette Felts, FNP. A1C= 6.1. Ferritin= 665. LDL= 107. Start loratadine 10 mg daily.   09-01-2022 Barb Merino, LPN. Medicare wellness visit.   Recent consult visits:  09-01-2022 Basilio Cairo, MD  (Dermatology). Stop plaquenil and temovate.   Hospital visits:  Medication Reconciliation was completed by comparing discharge summary, patient's EMR and Pharmacy list, and upon discussion with patient.   Admitted to the hospital on 07-20-2022 due to Chest pain. Discharge date was 07-20-2022. Discharged from [facility] ED at [facility].     New?Medications Started at [facility] Discharge:?? Hydro/acetaminophen 5-325 mg 2 tablets every 4 hours PRN Reglan 10 mg every 6 hours PRN   Medication Changes at Hospital Discharge: None   Medications Discontinued at Hospital Discharge: None   Medications that remain the same after Hospital Discharge:??  -All other medications will remain the same.     Hospital visits:  Medication Reconciliation was completed by comparing discharge summary, patient's EMR and Pharmacy list, and upon discussion with patient.   Admitted to the hospital on 05-16-2022 due to Acute idiopathic gout involving toe of  right foot  . Discharge date was 05-16-2022. Discharged from [facility] ED at [facility].     New?Medications Started at [facility] Discharge:?? Colchicine 0.6 mg daily PRN   Medication Changes at Hospital Discharge: None   Medications Discontinued at Hospital Discharge: None   Medications that remain the same after Hospital Discharge:??  -All other medications will remain the same.     Objective:  Lab Results  Component Value Date   CREATININE 1.15 10/12/2022   BUN 15 10/12/2022   EGFR 69 10/12/2022   GFRNONAA >60 07/20/2022   GFRAA 73 08/06/2020   NA 139 10/12/2022   K 4.6 10/12/2022   CALCIUM 9.7 10/12/2022   CO2 22 10/12/2022   GLUCOSE 95 10/12/2022    Lab Results  Component Value Date/Time   HGBA1C 6.1 (H) 10/12/2022 09:37 AM   HGBA1C 6.4 03/04/2022 12:00 AM   HGBA1C 6.4 (H) 10/07/2021 10:38 AM    Last diabetic Eye exam: No results found for: "HMDIABEYEEXA"  Last diabetic Foot exam: No results found for: "HMDIABFOOTEX"   Lab Results  Component Value Date   CHOL 196 10/12/2022   HDL 70 10/12/2022   LDLCALC 107 (H) 10/12/2022   TRIG 107 10/12/2022   CHOLHDL 2.8 10/12/2022       Latest Ref Rng & Units 10/12/2022    9:37 AM 07/20/2022   12:10 PM 10/07/2021   10:38 AM  Hepatic Function  Total Protein 6.0 - 8.5 g/dL 6.9  8.2  6.7   Albumin 3.9 - 4.9 g/dL 4.7  4.6  4.5   AST 0 - 40 IU/L 25  28  26  ALT 0 - 44 IU/L 30  22  24    Alk Phosphatase 44 - 121 IU/L 63  50  65   Total Bilirubin 0.0 - 1.2 mg/dL 0.6  1.0  0.5   Bilirubin, Direct 0.0 - 0.2 mg/dL  0.2      Lab Results  Component Value Date/Time   TSH 1.030 01/21/2022 03:53 PM   TSH 1.710 12/10/2019 04:58 PM       Latest Ref Rng & Units 10/12/2022    9:37 AM 07/20/2022   12:10 PM 10/07/2021   10:38 AM  CBC  WBC 3.4 - 10.8 x10E3/uL 6.3  11.1  6.0   Hemoglobin 13.0 - 17.7 g/dL 40.9  81.1  91.4   Hematocrit 37.5 - 51.0 % 42.5  44.5  41.9   Platelets 150 - 450 x10E3/uL 215  225   231     Lab Results  Component Value Date/Time   VD25OH 49.6 12/10/2019 04:58 PM   VITAMINB12 614 12/10/2019 04:58 PM    Clinical ASCVD: No  The 10-year ASCVD risk score (Arnett DK, et al., 2019) is: 11.7%   Values used to calculate the score:     Age: 67 years     Sex: Male     Is Non-Hispanic African American: Yes     Diabetic: No     Tobacco smoker: Yes     Systolic Blood Pressure: 102 mmHg     Is BP treated: No     HDL Cholesterol: 70 mg/dL     Total Cholesterol: 196 mg/dL        7/82/9562    1:30 AM 09/01/2022    9:02 AM 07/29/2022   12:17 PM  Depression screen PHQ 2/9  Decreased Interest 0 0 0  Down, Depressed, Hopeless 0 0 0  PHQ - 2 Score 0 0 0     Social History   Tobacco Use  Smoking Status Former   Types: Cigars   Start date: 06/21/1989   Quit date: 03/26/2013   Years since quitting: 9.5  Smokeless Tobacco Never  Tobacco Comments   Quit cigarettes in 1978, started cigars around 2004   BP Readings from Last 3 Encounters:  10/12/22 102/72  07/29/22 118/72  07/20/22 117/77   Pulse Readings from Last 3 Encounters:  10/12/22 60  07/29/22 (!) 59  07/20/22 70   Wt Readings from Last 3 Encounters:  10/12/22 174 lb (78.9 kg)  09/01/22 180 lb (81.6 kg)  07/29/22 176 lb (79.8 kg)   BMI Readings from Last 3 Encounters:  10/12/22 25.70 kg/m  09/01/22 26.58 kg/m  07/29/22 25.99 kg/m    No Known Allergies  Medications Reviewed Today     Reviewed by Dorna Mai, CMA (Certified Medical Assistant) on 10/12/22 at 0845  Med List Status: <None>   Medication Order Taking? Sig Documenting Provider Last Dose Status Informant  acyclovir ointment (ZOVIRAX) 5 % [id number] Yes APPLY 1 APPLICATION TOPICALLY EVERY 3 HOURS Arnette Felts, FNP Taking Active   celecoxib (CELEBREX) 200 MG capsule [id number] Yes Take 200 mg by mouth daily. Takes as needed [provider] Taking Active Self  Cholecalciferol 125 MCG (5000 UT) TABS [id number] Yes Take 125 mcg  by mouth daily. Taking 1 tablet by mouth daily. [provider] Taking Active   clobetasol cream (TEMOVATE) 0.05 % [id number] Yes Apply 1 Application topically 2 (two) times daily. [provider] Taking Active   colchicine 0.6 MG tablet [id number] No Take 1 tablet (0.6 mg total) by  mouth daily as needed (gout).  Patient not taking: Reported on 07/29/2022   Terrilee Files, MD Not Taking Active   cyclobenzaprine (FLEXERIL) 10 MG tablet [id number] Yes Take 1 tablet (10 mg total) by mouth 3 (three) times daily as needed for muscle spasms. Arnette Felts, FNP Taking Active   diclofenac Sodium (VOLTAREN) 1 % GEL [id number] Yes Apply 2 g topically 4 (four) times daily. Arnette Felts, FNP Taking Active   ferrous sulfate 325 (65 FE) MG EC tablet [id number] Yes Take 1 tablet (325 mg total) by mouth 2 (two) times daily. Arnette Felts, FNP Taking Active   HYDROcodone-acetaminophen [facility]) 5-325 MG tablet [id number] Yes Take 2 tablets by mouth every 4 (four) hours as needed. Arthor Captain, PA-C Taking Active   hydroxychloroquine (PLAQUENIL) 200 MG tablet [id number] Yes Take 1 tablet (200 mg total) by mouth 2 (two) times daily. Arnette Felts, FNP Taking Active   meloxicam [facility]) 7.5 MG tablet [id number] Yes Take by mouth. As needed [provider] Taking Active Self  metoCLOPramide (REGLAN) 10 MG tablet [id number] Yes Take 1 tablet (10 mg total) by mouth every 6 (six) hours as needed for nausea (nausea/headache). Arthor Captain, PA-C Taking Active   Multiple Vitamins-Minerals (ADULT GUMMY PO) [id number] Yes Take 1 each by mouth in the morning, at noon, and at bedtime. FIBER [provider] Taking Active   OPZELURA 1.5 % CREA [id number] Yes  [provider] Taking Active   rosuvastatin (CRESTOR) 10 MG tablet [id number] Yes Take 1 tablet (10 mg total) by mouth daily. Arnette Felts, FNP Taking Active   tamsulosin [facility]) 0.4 MG CAPS capsule [id number] Yes Take 0.4 mg by mouth at  bedtime. [provider] Taking Active   vitamin E 200 UNIT capsule [id number] Yes Take 5,000 Units by mouth daily.  [provider] Taking Active             SDOH:  (Social Determinants of Health) assessments and interventions performed: No SDOH Interventions    Flowsheet Row Clinical Support from 09/01/2022 in [facility] Chronic Care Management from 04/14/2022 in [facility] Clinical Support from 08/01/2019 in [facility]  SDOH Interventions     Food Insecurity Interventions Intervention Not Indicated -- --  Housing Interventions Intervention Not Indicated -- --  Transportation Interventions Intervention Not Indicated -- --  Alcohol Usage Interventions -- Intervention Not Indicated (Score <7) --  Depression Interventions/Treatment  -- -- URK2-7 Score <4 Follow-up Not Indicated  Financial Strain Interventions Intervention Not Indicated -- --  Physical Activity Interventions Intervention Not Indicated -- --  Stress Interventions Intervention Not Indicated -- --       Medication Assistance: None required.  Patient affirms current coverage meets needs.  Medication Access: Within the past 30 days, how often has patient missed a dose of medication? He has not missed any medication dosages  Is a pillbox or other method used to improve adherence? No  Factors that may affect medication adherence? nonadherence to medications and lack of understanding of disease management Are meds synced by current pharmacy? No  Are meds delivered by current pharmacy? Yes  Does patient experience delays in picking up medications due to transportation concerns? No   [facility] Services Reviewed: Is patient disadvantaged to use [facility]?: Yes  Current Rx insurance plan: TRICARE Name and location of Current pharmacy:  [id number] - [place], [place] - [address] AT  [address] &  [facility] [address] [place] [address] Phone: [phone] Fax: [phone]  [facility] - [place], [place] - [address] [place] [address] Phone: [phone] Fax: [phone]  [facility] services reviewed with patient today?: No   Compliance/Adherence/Medication fill history: Care Gaps: Annual wellness visit in last year? Yes Covid booster overdue  Star-Rating Drugs: Rosuvastatin 10 mg- Last filled 07-29-2022 90 DS [facility]. Previous 12-30-2021 [facility]    Assessment/Plan   Hyperlipidemia: (LDL goal < 100 ) -Uncontrolled -Current treatment: Rosuvastatin 10 mg tablet once per day Appropriate, Query effective -Current dietary patterns: he is eating very healthy, vegetables are frozen or fresh and he is eating lean meats  -Current exercise habits: he is golfing at least twice per week during the warmer times of the year. He is going to start doing stretching more than lifting heavy weights  -Educated on Cholesterol goals;  Benefits of statin for ASCVD risk reduction; Strategies to manage statin-induced myalgias; -Collaborated with patient and PCP to increase Rosuvastatin 10 mg tablet to 20 mg tablet once per day  Cherylin Mylar, CPP, PharmD Clinical Pharmacist Practitioner [facility] [phone]

## 2022-10-24 ENCOUNTER — Other Ambulatory Visit: Payer: Self-pay | Admitting: Nurse Practitioner

## 2022-10-24 DIAGNOSIS — E782 Mixed hyperlipidemia: Secondary | ICD-10-CM

## 2022-11-12 DIAGNOSIS — N529 Male erectile dysfunction, unspecified: Secondary | ICD-10-CM | POA: Diagnosis not present

## 2022-11-12 DIAGNOSIS — N3943 Post-void dribbling: Secondary | ICD-10-CM | POA: Diagnosis not present

## 2022-11-12 DIAGNOSIS — R3912 Poor urinary stream: Secondary | ICD-10-CM | POA: Diagnosis not present

## 2022-12-01 ENCOUNTER — Encounter: Payer: Self-pay | Admitting: Nurse Practitioner

## 2022-12-06 ENCOUNTER — Encounter: Payer: Self-pay | Admitting: Nurse Practitioner

## 2022-12-07 ENCOUNTER — Encounter: Payer: Self-pay | Admitting: Family Medicine

## 2022-12-07 ENCOUNTER — Ambulatory Visit (INDEPENDENT_AMBULATORY_CARE_PROVIDER_SITE_OTHER): Payer: Medicare PPO | Admitting: Family Medicine

## 2022-12-07 VITALS — BP 108/70 | HR 78 | Temp 97.5°F | Ht 69.0 in | Wt 174.8 lb

## 2022-12-07 DIAGNOSIS — K219 Gastro-esophageal reflux disease without esophagitis: Secondary | ICD-10-CM

## 2022-12-07 DIAGNOSIS — M79671 Pain in right foot: Secondary | ICD-10-CM

## 2022-12-07 MED ORDER — PANTOPRAZOLE SODIUM 20 MG PO TBEC: 20.0000 mg | DELAYED_RELEASE_TABLET | Freq: Every day | ORAL | 1 refills | Status: DC

## 2022-12-07 MED ORDER — TRAMADOL HCL 50 MG PO TABS: 50.0000 mg | ORAL_TABLET | Freq: Three times a day (TID) | ORAL | 0 refills | Status: AC | PRN

## 2022-12-07 NOTE — Progress Notes (Unsigned)
I,Victoria T Hamilton, CMA,acting as a Neurosurgeon for [employer], NP.,have documented all relevant documentation on the behalf of Kincade Granberg, NP,as directed by  Oreoluwa Aigner Moshe Salisbury, NP while in the presence of Nakai Yard, NP.  Subjective:  Patient ID: Robert Donaldson , male    DOB: [birth year] , 67 y.o.   MRN: [id number]  Chief Complaint  Patient presents with   Right Foot Pain     HPI  Patient presents today for foot pain. He reports this initially started Sunday. He admits previously having issues with plantar fascitis.  He reports being established with an podiatrist, he admits he has not visited the specialist in a year or so.    He states taking ibuprofen at home. He admits this does not help relieve the pain.  He rates pain 9/10. He describes pain as a shooting pain from foot up to the side of his leg.   Gastroesophageal Reflux He complains of abdominal pain, belching, early satiety and heartburn. This is a chronic problem. The current episode started more than 1 year ago. The problem has been rapidly worsening. The heartburn duration is more than one hour. The heartburn is located in the LUQ. The heartburn is of moderate intensity. The heartburn wakes him from sleep. The heartburn limits his activity. The symptoms are aggravated by lying down and caffeine. Risk factors include NSAIDs. He has tried head elevation for the symptoms.     Past Medical History:  Diagnosis Date   BPH (benign prostatic hyperplasia)    HLD (hyperlipidemia)    Pancreatitis    Vitamin D deficiency      Family History  Problem Relation Age of Onset   Hyperlipidemia Sister    Diabetes Paternal Grandmother      Current Outpatient Medications:    acyclovir ointment (ZOVIRAX) 5 %, APPLY 1 APPLICATION TOPICALLY EVERY 3 HOURS, Disp: 30 g, Rfl: 11   celecoxib (CELEBREX) 200 MG capsule, Take 200 mg by mouth daily. Takes as needed, Disp: , Rfl:    Cholecalciferol 125 MCG (5000 UT) TABS, Take 125 mcg by mouth daily.  Taking 1 tablet by mouth daily., Disp: , Rfl:    cyclobenzaprine (FLEXERIL) 10 MG tablet, Take 1 tablet (10 mg total) by mouth 3 (three) times daily as needed for muscle spasms., Disp: 30 tablet, Rfl: 0   diclofenac Sodium (VOLTAREN) 1 % GEL, Apply 2 g topically 4 (four) times daily., Disp: 100 g, Rfl: 2   ferrous sulfate 325 (65 FE) MG EC tablet, Take 1 tablet (325 mg total) by mouth 2 (two) times daily., Disp: 180 tablet, Rfl: 1   HYDROcodone-acetaminophen (NORCO) 5-325 MG tablet, Take 2 tablets by mouth every 4 (four) hours as needed., Disp: 10 tablet, Rfl: 0   hydroxychloroquine (PLAQUENIL) 200 MG tablet, Take 1 tablet (200 mg total) by mouth 2 (two) times daily., Disp: 60 tablet, Rfl: 0   loratadine (CLARITIN) 10 MG tablet, Take 1 tablet (10 mg total) by mouth daily., Disp: 90 tablet, Rfl: 1   meloxicam (MOBIC) 7.5 MG tablet, Take by mouth. As needed, Disp: , Rfl:    Multiple Vitamins-Minerals (ADULT GUMMY PO), Take 1 each by mouth in the morning, at noon, and at bedtime. FIBER, Disp: , Rfl:    OPZELURA 1.5 % CREA, , Disp: , Rfl:    rosuvastatin (CRESTOR) 20 MG tablet, Take 1 tablet (20 mg total) by mouth daily., Disp: 95 tablet, Rfl: 1   tamsulosin (FLOMAX) 0.4 MG CAPS capsule, Take 0.4 mg by mouth  at bedtime., Disp: , Rfl:    vitamin E 200 UNIT capsule, Take 5,000 Units by mouth daily. , Disp: , Rfl:    colchicine 0.6 MG tablet, Take 1 tablet (0.6 mg total) by mouth daily as needed (gout). (Patient not taking: Reported on 07/29/2022), Disp: 20 tablet, Rfl: 0   metoCLOPramide (REGLAN) 10 MG tablet, Take 1 tablet (10 mg total) by mouth every 6 (six) hours as needed for nausea (nausea/headache). (Patient not taking: Reported on 10/12/2022), Disp: 6 tablet, Rfl: 0   No Known Allergies   Review of Systems  Constitutional: Negative.   HENT: Negative.    Respiratory: Negative.    Gastrointestinal:  Positive for abdominal pain and heartburn.  Skin: Negative.   Allergic/Immunologic: Negative.    Neurological: Negative.   Hematological: Negative.      Today's Vitals   12/07/22 1444  BP: 108/70  Pulse: 78  Temp: (!) 97.5 F (36.4 C)  SpO2: 98%  Weight: 174 lb 12.8 oz (79.3 kg)  Height: 5\' 9"  (1.753 m)   Body mass index is 25.81 kg/m.  Wt Readings from Last 3 Encounters:  12/07/22 174 lb 12.8 oz (79.3 kg)  10/12/22 174 lb (78.9 kg)  09/01/22 180 lb (81.6 kg)    The 10-year ASCVD risk score (Arnett DK, et al., 2019) is: 12.9%   Values used to calculate the score:     Age: 70 years     Sex: Male     Is Non-Hispanic African American: Yes     Diabetic: No     Tobacco smoker: Yes     Systolic Blood Pressure: 108 mmHg     Is BP treated: No     HDL Cholesterol: 70 mg/dL     Total Cholesterol: 196 mg/dL  Objective:  Physical Exam Constitutional:      Appearance: Normal appearance.  Pulmonary:     Effort: Pulmonary effort is normal. No respiratory distress.  Neurological:     General: No focal deficit present.     Mental Status: He is alert and oriented to person, place, and time. Mental status is at baseline.  Psychiatric:        Mood and Affect: Mood normal.         Assessment And Plan:  1. Right foot pain    No follow-ups on file.  Patient was given opportunity to ask questions. Patient verbalized understanding of the plan and was able to repeat key elements of the plan. All questions were answered to their satisfaction.  Hilma Steinhilber Moshe Salisbury, NP  I, Rishith Siddoway Moshe Salisbury, NP, have reviewed all documentation for this visit. The documentation on 12/07/22 for the exam, diagnosis, procedures, and orders are all accurate and complete.   IF YOU HAVE BEEN REFERRED TO A SPECIALIST, IT MAY TAKE 1-2 WEEKS TO SCHEDULE/PROCESS THE REFERRAL. IF YOU HAVE NOT HEARD FROM US/SPECIALIST IN TWO WEEKS, PLEASE GIVE [place] A CALL AT [phone].

## 2022-12-09 ENCOUNTER — Encounter: Payer: Self-pay | Admitting: Family Medicine

## 2022-12-09 DIAGNOSIS — M79671 Pain in right foot: Secondary | ICD-10-CM | POA: Insufficient documentation

## 2022-12-09 DIAGNOSIS — K219 Gastro-esophageal reflux disease without esophagitis: Secondary | ICD-10-CM

## 2022-12-09 HISTORY — DX: Gastro-esophageal reflux disease without esophagitis: K21.9

## 2022-12-09 NOTE — Assessment & Plan Note (Signed)
May use ice and pain meds until ortho appt

## 2022-12-09 NOTE — Assessment & Plan Note (Addendum)
Start protonix Q D; advised to stop  NSAID use, referred to GI

## 2022-12-15 DIAGNOSIS — M79671 Pain in right foot: Secondary | ICD-10-CM | POA: Diagnosis not present

## 2022-12-15 DIAGNOSIS — M170 Bilateral primary osteoarthritis of knee: Secondary | ICD-10-CM | POA: Diagnosis not present

## 2022-12-15 DIAGNOSIS — M1711 Unilateral primary osteoarthritis, right knee: Secondary | ICD-10-CM | POA: Diagnosis not present

## 2022-12-15 DIAGNOSIS — M1712 Unilateral primary osteoarthritis, left knee: Secondary | ICD-10-CM | POA: Diagnosis not present

## 2022-12-16 ENCOUNTER — Ambulatory Visit: Payer: TRICARE For Life (TFL) | Admitting: Orthopedic Surgery

## 2023-01-05 DIAGNOSIS — M1712 Unilateral primary osteoarthritis, left knee: Secondary | ICD-10-CM | POA: Diagnosis not present

## 2023-01-05 DIAGNOSIS — M170 Bilateral primary osteoarthritis of knee: Secondary | ICD-10-CM | POA: Diagnosis not present

## 2023-01-05 DIAGNOSIS — M1711 Unilateral primary osteoarthritis, right knee: Secondary | ICD-10-CM | POA: Diagnosis not present

## 2023-01-11 ENCOUNTER — Encounter: Payer: Self-pay | Admitting: Nurse Practitioner

## 2023-01-13 ENCOUNTER — Encounter: Payer: Self-pay | Admitting: Gastroenterology

## 2023-01-13 DIAGNOSIS — M6281 Muscle weakness (generalized): Secondary | ICD-10-CM | POA: Diagnosis not present

## 2023-01-13 DIAGNOSIS — M170 Bilateral primary osteoarthritis of knee: Secondary | ICD-10-CM | POA: Diagnosis not present

## 2023-01-17 ENCOUNTER — Encounter: Payer: Self-pay | Admitting: Pharmacist

## 2023-01-17 DIAGNOSIS — L309 Dermatitis, unspecified: Secondary | ICD-10-CM | POA: Diagnosis not present

## 2023-01-17 NOTE — Progress Notes (Signed)
Patient previously followed by [facility] pharmacist. Per clinical review, no pharmacist appointment needed at this time.

## 2023-01-21 DIAGNOSIS — M170 Bilateral primary osteoarthritis of knee: Secondary | ICD-10-CM | POA: Diagnosis not present

## 2023-01-21 DIAGNOSIS — M6281 Muscle weakness (generalized): Secondary | ICD-10-CM | POA: Diagnosis not present

## 2023-01-26 ENCOUNTER — Encounter: Payer: Self-pay | Admitting: Nurse Practitioner

## 2023-01-27 ENCOUNTER — Telehealth: Payer: Self-pay

## 2023-01-27 DIAGNOSIS — M170 Bilateral primary osteoarthritis of knee: Secondary | ICD-10-CM | POA: Diagnosis not present

## 2023-01-27 DIAGNOSIS — M6281 Muscle weakness (generalized): Secondary | ICD-10-CM | POA: Diagnosis not present

## 2023-01-27 NOTE — Telephone Encounter (Signed)
Patient notified us by mychart that he has covid. I sent message and called pt as well to see if he wanted covid treatment, left pt vm. YL,RMA

## 2023-01-30 ENCOUNTER — Encounter: Payer: Self-pay | Admitting: Nurse Practitioner

## 2023-02-01 DIAGNOSIS — M25522 Pain in left elbow: Secondary | ICD-10-CM | POA: Diagnosis not present

## 2023-02-02 ENCOUNTER — Other Ambulatory Visit: Payer: Self-pay | Admitting: Family Medicine

## 2023-02-02 DIAGNOSIS — M170 Bilateral primary osteoarthritis of knee: Secondary | ICD-10-CM | POA: Diagnosis not present

## 2023-02-02 DIAGNOSIS — K219 Gastro-esophageal reflux disease without esophagitis: Secondary | ICD-10-CM

## 2023-02-02 DIAGNOSIS — M1712 Unilateral primary osteoarthritis, left knee: Secondary | ICD-10-CM | POA: Diagnosis not present

## 2023-02-02 DIAGNOSIS — M1711 Unilateral primary osteoarthritis, right knee: Secondary | ICD-10-CM | POA: Diagnosis not present

## 2023-02-03 DIAGNOSIS — M170 Bilateral primary osteoarthritis of knee: Secondary | ICD-10-CM | POA: Diagnosis not present

## 2023-02-03 DIAGNOSIS — M6281 Muscle weakness (generalized): Secondary | ICD-10-CM | POA: Diagnosis not present

## 2023-02-08 DIAGNOSIS — M25522 Pain in left elbow: Secondary | ICD-10-CM | POA: Diagnosis not present

## 2023-02-08 DIAGNOSIS — Z789 Other specified health status: Secondary | ICD-10-CM | POA: Diagnosis not present

## 2023-02-10 DIAGNOSIS — M6281 Muscle weakness (generalized): Secondary | ICD-10-CM | POA: Diagnosis not present

## 2023-02-10 DIAGNOSIS — M170 Bilateral primary osteoarthritis of knee: Secondary | ICD-10-CM | POA: Diagnosis not present

## 2023-02-13 ENCOUNTER — Encounter: Payer: Self-pay | Admitting: Nurse Practitioner

## 2023-02-14 ENCOUNTER — Other Ambulatory Visit: Payer: Self-pay

## 2023-02-14 DIAGNOSIS — E782 Mixed hyperlipidemia: Secondary | ICD-10-CM

## 2023-02-14 MED ORDER — ROSUVASTATIN CALCIUM 20 MG PO TABS: 20.0000 mg | ORAL_TABLET | Freq: Every day | ORAL | 2 refills | Status: DC

## 2023-02-15 ENCOUNTER — Other Ambulatory Visit: Payer: Self-pay | Admitting: Nurse Practitioner

## 2023-02-15 DIAGNOSIS — L439 Lichen planus, unspecified: Secondary | ICD-10-CM

## 2023-02-16 DIAGNOSIS — H40023 Open angle with borderline findings, high risk, bilateral: Secondary | ICD-10-CM | POA: Diagnosis not present

## 2023-02-16 DIAGNOSIS — Z789 Other specified health status: Secondary | ICD-10-CM | POA: Diagnosis not present

## 2023-02-16 DIAGNOSIS — H353112 Nonexudative age-related macular degeneration, right eye, intermediate dry stage: Secondary | ICD-10-CM | POA: Diagnosis not present

## 2023-02-16 DIAGNOSIS — H2513 Age-related nuclear cataract, bilateral: Secondary | ICD-10-CM | POA: Diagnosis not present

## 2023-02-16 DIAGNOSIS — M25522 Pain in left elbow: Secondary | ICD-10-CM | POA: Diagnosis not present

## 2023-02-18 ENCOUNTER — Other Ambulatory Visit: Payer: Self-pay

## 2023-02-18 DIAGNOSIS — K219 Gastro-esophageal reflux disease without esophagitis: Secondary | ICD-10-CM

## 2023-02-18 MED ORDER — PANTOPRAZOLE SODIUM 20 MG PO TBEC: 20.0000 mg | DELAYED_RELEASE_TABLET | Freq: Every day | ORAL | 2 refills | Status: DC

## 2023-03-01 ENCOUNTER — Other Ambulatory Visit: Payer: Self-pay

## 2023-03-01 DIAGNOSIS — K219 Gastro-esophageal reflux disease without esophagitis: Secondary | ICD-10-CM

## 2023-03-01 MED ORDER — PANTOPRAZOLE SODIUM 20 MG PO TBEC: 20.0000 mg | DELAYED_RELEASE_TABLET | Freq: Every day | ORAL | 2 refills | Status: DC

## 2023-03-03 ENCOUNTER — Other Ambulatory Visit: Payer: Self-pay

## 2023-03-04 DIAGNOSIS — M79674 Pain in right toe(s): Secondary | ICD-10-CM | POA: Diagnosis not present

## 2023-03-08 DIAGNOSIS — M25522 Pain in left elbow: Secondary | ICD-10-CM | POA: Diagnosis not present

## 2023-03-08 DIAGNOSIS — Z789 Other specified health status: Secondary | ICD-10-CM | POA: Diagnosis not present

## 2023-03-09 DIAGNOSIS — L810 Postinflammatory hyperpigmentation: Secondary | ICD-10-CM | POA: Diagnosis not present

## 2023-03-09 DIAGNOSIS — L439 Lichen planus, unspecified: Secondary | ICD-10-CM | POA: Diagnosis not present

## 2023-03-09 DIAGNOSIS — L281 Prurigo nodularis: Secondary | ICD-10-CM | POA: Diagnosis not present

## 2023-03-13 ENCOUNTER — Encounter: Payer: Self-pay | Admitting: Nurse Practitioner

## 2023-03-16 DIAGNOSIS — Z789 Other specified health status: Secondary | ICD-10-CM | POA: Diagnosis not present

## 2023-03-16 DIAGNOSIS — M25522 Pain in left elbow: Secondary | ICD-10-CM | POA: Diagnosis not present

## 2023-03-21 DIAGNOSIS — M25522 Pain in left elbow: Secondary | ICD-10-CM | POA: Diagnosis not present

## 2023-03-21 DIAGNOSIS — Z789 Other specified health status: Secondary | ICD-10-CM | POA: Diagnosis not present

## 2023-03-30 DIAGNOSIS — M25522 Pain in left elbow: Secondary | ICD-10-CM | POA: Diagnosis not present

## 2023-03-30 DIAGNOSIS — Z789 Other specified health status: Secondary | ICD-10-CM | POA: Diagnosis not present

## 2023-04-12 ENCOUNTER — Ambulatory Visit (INDEPENDENT_AMBULATORY_CARE_PROVIDER_SITE_OTHER): Payer: Medicare PPO | Admitting: Gastroenterology

## 2023-04-12 ENCOUNTER — Encounter: Payer: Self-pay | Admitting: Gastroenterology

## 2023-04-12 VITALS — BP 116/80 | HR 67 | Ht 69.0 in | Wt 178.6 lb

## 2023-04-12 DIAGNOSIS — M25522 Pain in left elbow: Secondary | ICD-10-CM | POA: Diagnosis not present

## 2023-04-12 DIAGNOSIS — R143 Flatulence: Secondary | ICD-10-CM | POA: Diagnosis not present

## 2023-04-12 DIAGNOSIS — R140 Abdominal distension (gaseous): Secondary | ICD-10-CM | POA: Diagnosis not present

## 2023-04-12 DIAGNOSIS — Z789 Other specified health status: Secondary | ICD-10-CM | POA: Diagnosis not present

## 2023-04-12 NOTE — Progress Notes (Unsigned)
HPI : Robert Donaldson is a 67 y.o. male with a history of GERD who is referred to [place] by Arnette Felts, FNP for further evaluation management of chronic GERD symptoms and unpleasant upper GI symptoms.  He complains of excessive flatulence over the past several years. Gas is foul smelling. Not painful. Constantly having to pass gas (50-60) Symptoms consistent.  Been a problem for many years.  Hydroxychloroquine for skin lesions Sometimes has heartburn at night.  Made some dietary improvements (increased fruits/vegetables).  Minimal red meat.  Decreased breads.    Bowel movements are regular, typically soft. Daily, often 2-3 times/day.  Weight is stable.    Taking oral ferrous for several years.  Taking Protonix daily for several months.  Controls reflux.  Stopped Crestor because prescription ran out.  No dysphagia.        CT Abdomen/Pelvis w/out contrast Jan 2024 IMPRESSION: 1. No renal stones or obstructive uropathy. 2. Faint fat stranding about the head of the pancreas can be seen in the setting of pancreatitis. 3. Left colonic diverticulosis without diverticulitis. 4. Small fat containing right inguinal hernia. Diminutive fat containing umbilical hernia   RUQUS Jul 20, 2022 IMPRESSION: No sonographic finding to explain right upper quadrant pain.  Colonoscopy Feb 2016 (Dr. Loreta Ave) Ascending diverticulosis, otherwise normal  Past Medical History:  Diagnosis Date   BPH (benign prostatic hyperplasia)    Gastroesophageal reflux disease 12/09/2022   HLD (hyperlipidemia)    Pancreatitis    Vitamin D deficiency      Past Surgical History:  Procedure Laterality Date   KNEE SURGERY     Family History  Problem Relation Age of Onset   Hyperlipidemia Sister    Diabetes Paternal Grandmother    Social History   Tobacco Use   Smoking status: Former    Types: Cigars    Start date: 06/21/1989    Quit date: 03/26/2013    Years since quitting: 10.0   Smokeless  tobacco: Never   Tobacco comments:    Quit cigarettes in 1978, started cigars around 2004  Vaping Use   Vaping status: Never Used  Substance Use Topics   Alcohol use: Not Currently    Comment: occassionally   Drug use: No   Current Outpatient Medications  Medication Sig Dispense Refill   acyclovir ointment (ZOVIRAX) 5 % APPLY 1 APPLICATION TOPICALLY EVERY 3 HOURS 30 g 11   celecoxib (CELEBREX) 200 MG capsule Take 200 mg by mouth daily. Takes as needed     Cholecalciferol 125 MCG (5000 UT) TABS Take 125 mcg by mouth daily. Taking 1 tablet by mouth daily.     colchicine 0.6 MG tablet Take 1 tablet (0.6 mg total) by mouth daily as needed (gout). (Patient not taking: Reported on 07/29/2022) 20 tablet 0   cyclobenzaprine (FLEXERIL) 10 MG tablet Take 1 tablet (10 mg total) by mouth 3 (three) times daily as needed for muscle spasms. 30 tablet 0   diclofenac Sodium (VOLTAREN) 1 % GEL Apply 2 g topically 4 (four) times daily. 100 g 2   ferrous sulfate 325 (65 FE) MG EC tablet Take 1 tablet (325 mg total) by mouth 2 (two) times daily. 180 tablet 1   hydroxychloroquine (PLAQUENIL) 200 MG tablet Take 1 tablet (200 mg total) by mouth 2 (two) times daily. 60 tablet 0   loratadine (CLARITIN) 10 MG tablet Take 1 tablet (10 mg total) by mouth daily. 90 tablet 1   metoCLOPramide (REGLAN) 10 MG tablet Take 1 tablet (10 mg  total) by mouth every 6 (six) hours as needed for nausea (nausea/headache). (Patient not taking: Reported on 10/12/2022) 6 tablet 0   Multiple Vitamins-Minerals (ADULT GUMMY PO) Take 1 each by mouth in the morning, at noon, and at bedtime. FIBER     OPZELURA 1.5 % CREA      pantoprazole (PROTONIX) 20 MG tablet Take 1 tablet (20 mg total) by mouth daily. 30 tablet 2   rosuvastatin (CRESTOR) 20 MG tablet Take 1 tablet (20 mg total) by mouth daily. 90 tablet 2   tamsulosin (FLOMAX) 0.4 MG CAPS capsule Take 0.4 mg by mouth at bedtime.     vitamin E 200 UNIT capsule Take 5,000 Units by mouth daily.       No current facility-administered medications for this visit.   No Known Allergies   Review of Systems: All systems reviewed and negative except where noted in HPI.    No results found.  Physical Exam: There were no vitals taken for this visit. Constitutional: Pleasant,well-developed, ***male in no acute distress. HEENT: Normocephalic and atraumatic. Conjunctivae are normal. No scleral icterus. Neck supple.  Cardiovascular: Normal rate, regular rhythm.  Pulmonary/chest: Effort normal and breath sounds normal. No wheezing, rales or rhonchi. Abdominal: Soft, nondistended, nontender. Bowel sounds active throughout. There are no masses palpable. No hepatomegaly. Extremities: no edema Lymphadenopathy: No cervical adenopathy noted. Neurological: Alert and oriented to person place and time. Skin: Skin is warm and dry. No rashes noted. Psychiatric: Normal mood and affect. Behavior is normal.  CBC    Component Value Date/Time   WBC 6.3 10/12/2022 0937   WBC 11.1 (H) 07/20/2022 1210   RBC 4.68 10/12/2022 0937   RBC 4.92 07/20/2022 1210   HGB 14.3 10/12/2022 0937   HCT 42.5 10/12/2022 0937   PLT 215 10/12/2022 0937   MCV 91 10/12/2022 0937   MCH 30.6 10/12/2022 0937   MCH 30.9 07/20/2022 1210   MCHC 33.6 10/12/2022 0937   MCHC 34.2 07/20/2022 1210   RDW 13.2 10/12/2022 0937   LYMPHSABS 2,501 12/24/2020 1431   EOSABS 80 12/24/2020 1431   BASOSABS 36 12/24/2020 1431    CMP     Component Value Date/Time   NA 139 10/12/2022 0937   K 4.6 10/12/2022 0937   CL 101 10/12/2022 0937   CO2 22 10/12/2022 0937   GLUCOSE 95 10/12/2022 0937   GLUCOSE 101 (H) 07/20/2022 1210   BUN 15 10/12/2022 0937   CREATININE 1.15 10/12/2022 0937   CREATININE 1.29 (H) 12/24/2020 1431   CALCIUM 9.7 10/12/2022 0937   PROT 6.9 10/12/2022 0937   ALBUMIN 4.7 10/12/2022 0937   AST 25 10/12/2022 0937   ALT 30 10/12/2022 0937   ALKPHOS 63 10/12/2022 0937   BILITOT 0.6 10/12/2022 0937    GFRNONAA >60 07/20/2022 1210   GFRAA 73 08/06/2020 1140       Latest Ref Rng & Units 10/12/2022    9:37 AM 07/20/2022   12:10 PM 10/07/2021   10:38 AM  CBC EXTENDED  WBC 3.4 - 10.8 x10E3/uL 6.3  11.1  6.0   RBC 4.14 - 5.80 x10E6/uL 4.68  4.92  4.71   Hemoglobin 13.0 - 17.7 g/dL 16.1  09.6  04.5   HCT 37.5 - 51.0 % 42.5  44.5  41.9   Platelets 150 - 450 x10E3/uL 215  225  231       ASSESSMENT AND PLAN:   Bloating/gas/flatulence - Hydrogen breath test - Low FODMAP handouts   Arnette Felts, FNP

## 2023-04-12 NOTE — Progress Notes (Unsigned)
Madelaine Bhat, CMA,acting as a Neurosurgeon for Arnette Felts, FNP.,have documented all relevant documentation on the behalf of Arnette Felts, FNP,as directed by  Arnette Felts, FNP while in the presence of Arnette Felts, FNP.  Subjective:  Patient ID: Robert Donaldson , male    DOB: 30-Aug-1955 , 67 y.o.   MRN: [id number]  No chief complaint on file.   HPI  Patient presents today for a chol and pre dm follow up, Patient reports compliance with medication. Patient denies any chest pain, SOB, or headaches. Patient has no concerns today.     Past Medical History:  Diagnosis Date  . BPH (benign prostatic hyperplasia)   . Gastroesophageal reflux disease 12/09/2022  . HLD (hyperlipidemia)   . Pancreatitis   . Vitamin D deficiency      Family History  Problem Relation Age of Onset  . Kidney disease Mother   . Hyperlipidemia Sister   . Diabetes Paternal Grandmother      Current Outpatient Medications:  .  acyclovir ointment (ZOVIRAX) 5 %, APPLY 1 APPLICATION TOPICALLY EVERY 3 HOURS, Disp: 30 g, Rfl: 11 .  celecoxib (CELEBREX) 200 MG capsule, Take 200 mg by mouth daily. Takes as needed, Disp: , Rfl:  .  Cholecalciferol 125 MCG (5000 UT) TABS, Take 125 mcg by mouth daily. Taking 1 tablet by mouth daily., Disp: , Rfl:  .  colchicine 0.6 MG tablet, Take 1 tablet (0.6 mg total) by mouth daily as needed (gout)., Disp: 20 tablet, Rfl: 0 .  cyclobenzaprine (FLEXERIL) 10 MG tablet, Take 1 tablet (10 mg total) by mouth 3 (three) times daily as needed for muscle spasms., Disp: 30 tablet, Rfl: 0 .  diclofenac Sodium (VOLTAREN) 1 % GEL, Apply 2 g topically 4 (four) times daily., Disp: 100 g, Rfl: 2 .  ferrous sulfate 325 (65 FE) MG EC tablet, Take 1 tablet (325 mg total) by mouth 2 (two) times daily., Disp: 180 tablet, Rfl: 1 .  hydroxychloroquine (PLAQUENIL) 200 MG tablet, Take 1 tablet (200 mg total) by mouth 2 (two) times daily., Disp: 60 tablet, Rfl: 0 .  loratadine (CLARITIN) 10 MG tablet, Take 1 tablet  (10 mg total) by mouth daily., Disp: 90 tablet, Rfl: 1 .  Multiple Vitamins-Minerals (ADULT GUMMY PO), Take 1 each by mouth in the morning, at noon, and at bedtime. FIBER, Disp: , Rfl:  .  OPZELURA 1.5 % CREA, , Disp: , Rfl:  .  pantoprazole (PROTONIX) 20 MG tablet, Take 1 tablet (20 mg total) by mouth daily., Disp: 30 tablet, Rfl: 2 .  rosuvastatin (CRESTOR) 20 MG tablet, Take 1 tablet (20 mg total) by mouth daily. (Patient not taking: Reported on 04/12/2023), Disp: 90 tablet, Rfl: 2 .  tamsulosin (FLOMAX) 0.4 MG CAPS capsule, Take 0.4 mg by mouth at bedtime., Disp: , Rfl:  .  vitamin E 200 UNIT capsule, Take 5,000 Units by mouth daily. , Disp: , Rfl:    No Known Allergies   Review of Systems   There were no vitals filed for this visit. There is no height or weight on file to calculate BMI.  Wt Readings from Last 3 Encounters:  04/12/23 178 lb 9.6 oz (81 kg)  12/07/22 174 lb 12.8 oz (79.3 kg)  10/12/22 174 lb (78.9 kg)    The 10-year ASCVD risk score (Arnett DK, et al., 2019) is: 9.8%   Values used to calculate the score:     Age: 1 years     Sex: Male  Is Non-Hispanic African American: Yes     Diabetic: No     Tobacco smoker: No     Systolic Blood Pressure: 116 mmHg     Is BP treated: No     HDL Cholesterol: 70 mg/dL     Total Cholesterol: 296 mg/dL  Objective:  Physical Exam      Assessment And Plan:  Abnormal glucose  Mixed hyperlipidemia    No follow-ups on file.  Patient was given opportunity to ask questions. Patient verbalized understanding of the plan and was able to repeat key elements of the plan. All questions were answered to their satisfaction.    Jeanell Sparrow, FNP, have reviewed all documentation for this visit. The documentation on 04/12/23 for the exam, diagnosis, procedures, and orders are all accurate and complete.   IF YOU HAVE BEEN REFERRED TO A SPECIALIST, IT MAY TAKE 1-2 WEEKS TO SCHEDULE/PROCESS THE REFERRAL. IF YOU HAVE NOT HEARD FROM  US/SPECIALIST IN TWO WEEKS, PLEASE GIVE [place] A CALL AT [phone].

## 2023-04-12 NOTE — Patient Instructions (Addendum)
You will be contacted to schedule your Hydrogen Breath Test. If you have not been contacted within 1 week, please contact office at [phone] .  Please see low-Fodmap Diet handout.   _______________________________________________________  If your blood pressure at your visit was 140/90 or greater, please contact your primary care physician to follow up on this.  _______________________________________________________  If you are age 69 or older, your body mass index should be between 23-30. Your Body mass index is 26.37 kg/m. If this is out of the aforementioned range listed, please consider follow up with your Primary Care Provider.  If you are age 22 or younger, your body mass index should be between 19-25. Your Body mass index is 26.37 kg/m. If this is out of the aformentioned range listed, please consider follow up with your Primary Care Provider.   ________________________________________________________  The [facility] providers would like to encourage you to use [facility] to communicate with providers for non-urgent requests or questions.  Due to long hold times on the telephone, sending your provider a message by [facility] may be a faster and more efficient way to get a response.  Please allow 48 business hours for a response.  Please remember that this is for non-urgent requests.  _______________________________________________________ Due to recent changes in healthcare laws, you may see the results of your imaging and laboratory studies on MyChart before your provider has had a chance to review them.  We understand that in some cases there may be results that are confusing or concerning to you. Not all laboratory results come back in the same time frame and the provider may be waiting for multiple results in order to interpret others.  Please give [place] 48 hours in order for your provider to thoroughly review all the results before contacting the office for clarification of your results.    Thank you for choosing me and [facility].  Dr. Tiajuana Amass

## 2023-04-13 ENCOUNTER — Other Ambulatory Visit: Payer: Self-pay

## 2023-04-13 ENCOUNTER — Encounter: Payer: Self-pay | Admitting: Nurse Practitioner

## 2023-04-13 ENCOUNTER — Ambulatory Visit: Payer: Medicare PPO | Admitting: Nurse Practitioner

## 2023-04-13 VITALS — BP 120/74 | HR 76 | Temp 97.9°F | Ht 69.0 in | Wt 176.4 lb

## 2023-04-13 DIAGNOSIS — E663 Overweight: Secondary | ICD-10-CM

## 2023-04-13 DIAGNOSIS — R143 Flatulence: Secondary | ICD-10-CM | POA: Diagnosis not present

## 2023-04-13 DIAGNOSIS — R7309 Other abnormal glucose: Secondary | ICD-10-CM | POA: Diagnosis not present

## 2023-04-13 DIAGNOSIS — I700 Atherosclerosis of aorta: Secondary | ICD-10-CM

## 2023-04-13 DIAGNOSIS — Z6826 Body mass index (BMI) 26.0-26.9, adult: Secondary | ICD-10-CM

## 2023-04-13 DIAGNOSIS — E782 Mixed hyperlipidemia: Secondary | ICD-10-CM | POA: Diagnosis not present

## 2023-04-13 DIAGNOSIS — R140 Abdominal distension (gaseous): Secondary | ICD-10-CM

## 2023-04-13 MED ORDER — ROSUVASTATIN CALCIUM 20 MG PO TABS: 20.0000 mg | ORAL_TABLET | Freq: Every day | ORAL | 2 refills | Status: DC

## 2023-04-13 NOTE — Assessment & Plan Note (Signed)
Chronic, has not been taking his crestor over the last 3 months. Will check lipid panel.

## 2023-04-13 NOTE — Assessment & Plan Note (Addendum)
I have given him samples of Restora. Encouraged to avoid high gas producing foods.

## 2023-04-13 NOTE — Assessment & Plan Note (Signed)
Continue statin, tolerating well 

## 2023-04-13 NOTE — Assessment & Plan Note (Signed)
HgbA1c was slightly elevated at last visit. Diet controlled. Continue focusing on healthy diet low in sugar and starches.

## 2023-04-14 LAB — LIPID PANEL
Chol/HDL Ratio: 3.9 ratio (ref 0.0–5.0)
Cholesterol, Total: 285 mg/dL — ABNORMAL HIGH (ref 100–199)
HDL: 73 mg/dL (ref >39)
LDL Chol Calc (NIH): 194 mg/dL — ABNORMAL HIGH (ref 0–99)
Triglycerides: 107 mg/dL (ref 0–149)
VLDL Cholesterol Cal: 18 mg/dL (ref 5–40)

## 2023-04-14 LAB — CMP14+EGFR
ALT: 27 [IU]/L (ref 0–44)
AST: 39 [IU]/L (ref 0–40)
Albumin: 4.3 g/dL (ref 3.9–4.9)
Alkaline Phosphatase: 53 [IU]/L (ref 44–121)
BUN/Creatinine Ratio: 13 (ref 10–24)
BUN: 14 mg/dL (ref 8–27)
Bilirubin Total: 0.5 mg/dL (ref 0.0–1.2)
CO2: 20 mmol/L (ref 20–29)
Calcium: 9.4 mg/dL (ref 8.6–10.2)
Chloride: 105 mmol/L (ref 96–106)
Creatinine, Ser: 1.12 mg/dL (ref 0.76–1.27)
Globulin, Total: 2.6 g/dL (ref 1.5–4.5)
Glucose: 100 mg/dL — ABNORMAL HIGH (ref 70–99)
Potassium: 4.7 mmol/L (ref 3.5–5.2)
Sodium: 142 mmol/L (ref 134–144)
Total Protein: 6.9 g/dL (ref 6.0–8.5)
eGFR: 71 mL/min/{1.73_m2} (ref >59)

## 2023-04-14 LAB — HEMOGLOBIN A1C
Est. average glucose Bld gHb Est-mCnc: 134 mg/dL
Hgb A1c MFr Bld: 6.3 % — ABNORMAL HIGH (ref 4.8–5.6)

## 2023-04-28 ENCOUNTER — Telehealth: Payer: Self-pay

## 2023-04-28 NOTE — Telephone Encounter (Signed)
Contacted patient and ket him know to stop by the office and pick up a SIBO kit. Patient stated that he could pick it up next Friday.

## 2023-05-09 DIAGNOSIS — N3943 Post-void dribbling: Secondary | ICD-10-CM | POA: Diagnosis not present

## 2023-05-09 DIAGNOSIS — N529 Male erectile dysfunction, unspecified: Secondary | ICD-10-CM | POA: Diagnosis not present

## 2023-05-23 ENCOUNTER — Telehealth: Payer: Self-pay | Admitting: Gastroenterology

## 2023-05-23 NOTE — Telephone Encounter (Signed)
Inbound call from [facility] requesting for a order form for SIBO breath test to be faxed to [phone]. Please advise, thank you.

## 2023-05-25 NOTE — Telephone Encounter (Signed)
Orders faxed to [facility]

## 2023-06-01 DIAGNOSIS — H2513 Age-related nuclear cataract, bilateral: Secondary | ICD-10-CM | POA: Diagnosis not present

## 2023-06-01 DIAGNOSIS — R7303 Prediabetes: Secondary | ICD-10-CM | POA: Diagnosis not present

## 2023-06-01 DIAGNOSIS — H40023 Open angle with borderline findings, high risk, bilateral: Secondary | ICD-10-CM | POA: Diagnosis not present

## 2023-06-01 DIAGNOSIS — H353112 Nonexudative age-related macular degeneration, right eye, intermediate dry stage: Secondary | ICD-10-CM | POA: Diagnosis not present

## 2023-06-07 ENCOUNTER — Telehealth: Payer: Self-pay

## 2023-06-07 ENCOUNTER — Other Ambulatory Visit: Payer: Self-pay

## 2023-06-07 MED ORDER — AMOXICILLIN-POT CLAVULANATE 875-125 MG PO TABS: 1.0000 | ORAL_TABLET | Freq: Two times a day (BID) | ORAL | 0 refills | Status: DC

## 2023-06-07 NOTE — Telephone Encounter (Signed)
Pt aware and script sent to pharmacy for Augmentin.

## 2023-06-07 NOTE — Telephone Encounter (Signed)
-----   Message from Jenel Lucks sent at 06/03/2023  4:08 PM EST ----- Regarding: Breath test results Bonita Quin, Can you please contact Mr. Osler and let him know that his breath test showed elevated levels of hydrogen suggestive of small intestinal bacterial overgrowth.  This could be contributing towards his excessive gas/flatulence and abdominal discomfort.  This is best treated with rifaximin 550 mg three times daily for 14 days.  However, often this medication is prohibitively expensive.  If his out-of-pocket costs are prohibitive for this medication, I would recommend we treat with Augmentin 875 mg twice daily for 14 days.

## 2023-06-07 NOTE — Telephone Encounter (Signed)
Left message for pt to call back  °

## 2023-06-08 ENCOUNTER — Encounter: Payer: Self-pay | Admitting: Nurse Practitioner

## 2023-06-11 ENCOUNTER — Encounter: Payer: Self-pay | Admitting: Gastroenterology

## 2023-06-16 ENCOUNTER — Other Ambulatory Visit: Payer: Self-pay | Admitting: Nurse Practitioner

## 2023-06-27 ENCOUNTER — Encounter: Payer: Self-pay | Admitting: Nurse Practitioner

## 2023-07-01 DIAGNOSIS — M5386 Other specified dorsopathies, lumbar region: Secondary | ICD-10-CM | POA: Diagnosis not present

## 2023-07-01 DIAGNOSIS — M9902 Segmental and somatic dysfunction of thoracic region: Secondary | ICD-10-CM | POA: Diagnosis not present

## 2023-07-01 DIAGNOSIS — M9905 Segmental and somatic dysfunction of pelvic region: Secondary | ICD-10-CM | POA: Diagnosis not present

## 2023-07-01 DIAGNOSIS — M9904 Segmental and somatic dysfunction of sacral region: Secondary | ICD-10-CM | POA: Diagnosis not present

## 2023-07-01 DIAGNOSIS — M51362 Other intervertebral disc degeneration, lumbar region with discogenic back pain and lower extremity pain: Secondary | ICD-10-CM | POA: Diagnosis not present

## 2023-07-01 DIAGNOSIS — M5414 Radiculopathy, thoracic region: Secondary | ICD-10-CM | POA: Diagnosis not present

## 2023-07-01 DIAGNOSIS — M9903 Segmental and somatic dysfunction of lumbar region: Secondary | ICD-10-CM | POA: Diagnosis not present

## 2023-07-01 DIAGNOSIS — M5417 Radiculopathy, lumbosacral region: Secondary | ICD-10-CM | POA: Diagnosis not present

## 2023-07-04 DIAGNOSIS — M5386 Other specified dorsopathies, lumbar region: Secondary | ICD-10-CM | POA: Diagnosis not present

## 2023-07-04 DIAGNOSIS — M51362 Other intervertebral disc degeneration, lumbar region with discogenic back pain and lower extremity pain: Secondary | ICD-10-CM | POA: Diagnosis not present

## 2023-07-04 DIAGNOSIS — M9905 Segmental and somatic dysfunction of pelvic region: Secondary | ICD-10-CM | POA: Diagnosis not present

## 2023-07-04 DIAGNOSIS — M9904 Segmental and somatic dysfunction of sacral region: Secondary | ICD-10-CM | POA: Diagnosis not present

## 2023-07-04 DIAGNOSIS — M9903 Segmental and somatic dysfunction of lumbar region: Secondary | ICD-10-CM | POA: Diagnosis not present

## 2023-07-04 DIAGNOSIS — M5417 Radiculopathy, lumbosacral region: Secondary | ICD-10-CM | POA: Diagnosis not present

## 2023-07-04 DIAGNOSIS — M5414 Radiculopathy, thoracic region: Secondary | ICD-10-CM | POA: Diagnosis not present

## 2023-07-04 DIAGNOSIS — M9902 Segmental and somatic dysfunction of thoracic region: Secondary | ICD-10-CM | POA: Diagnosis not present

## 2023-07-06 ENCOUNTER — Other Ambulatory Visit: Payer: Self-pay

## 2023-07-06 DIAGNOSIS — E782 Mixed hyperlipidemia: Secondary | ICD-10-CM

## 2023-07-06 MED ORDER — ROSUVASTATIN CALCIUM 20 MG PO TABS: 20.0000 mg | ORAL_TABLET | Freq: Every day | ORAL | 2 refills | Status: DC

## 2023-07-07 DIAGNOSIS — M5386 Other specified dorsopathies, lumbar region: Secondary | ICD-10-CM | POA: Diagnosis not present

## 2023-07-07 DIAGNOSIS — M5414 Radiculopathy, thoracic region: Secondary | ICD-10-CM | POA: Diagnosis not present

## 2023-07-07 DIAGNOSIS — M9903 Segmental and somatic dysfunction of lumbar region: Secondary | ICD-10-CM | POA: Diagnosis not present

## 2023-07-07 DIAGNOSIS — M5417 Radiculopathy, lumbosacral region: Secondary | ICD-10-CM | POA: Diagnosis not present

## 2023-07-07 DIAGNOSIS — M9902 Segmental and somatic dysfunction of thoracic region: Secondary | ICD-10-CM | POA: Diagnosis not present

## 2023-07-07 DIAGNOSIS — M9905 Segmental and somatic dysfunction of pelvic region: Secondary | ICD-10-CM | POA: Diagnosis not present

## 2023-07-07 DIAGNOSIS — M9904 Segmental and somatic dysfunction of sacral region: Secondary | ICD-10-CM | POA: Diagnosis not present

## 2023-07-07 DIAGNOSIS — M51362 Other intervertebral disc degeneration, lumbar region with discogenic back pain and lower extremity pain: Secondary | ICD-10-CM | POA: Diagnosis not present

## 2023-07-18 DIAGNOSIS — M51362 Other intervertebral disc degeneration, lumbar region with discogenic back pain and lower extremity pain: Secondary | ICD-10-CM | POA: Diagnosis not present

## 2023-07-18 DIAGNOSIS — M5417 Radiculopathy, lumbosacral region: Secondary | ICD-10-CM | POA: Diagnosis not present

## 2023-07-18 DIAGNOSIS — M9902 Segmental and somatic dysfunction of thoracic region: Secondary | ICD-10-CM | POA: Diagnosis not present

## 2023-07-18 DIAGNOSIS — M9905 Segmental and somatic dysfunction of pelvic region: Secondary | ICD-10-CM | POA: Diagnosis not present

## 2023-07-18 DIAGNOSIS — M9904 Segmental and somatic dysfunction of sacral region: Secondary | ICD-10-CM | POA: Diagnosis not present

## 2023-07-18 DIAGNOSIS — M5386 Other specified dorsopathies, lumbar region: Secondary | ICD-10-CM | POA: Diagnosis not present

## 2023-07-18 DIAGNOSIS — M5414 Radiculopathy, thoracic region: Secondary | ICD-10-CM | POA: Diagnosis not present

## 2023-07-18 DIAGNOSIS — M9903 Segmental and somatic dysfunction of lumbar region: Secondary | ICD-10-CM | POA: Diagnosis not present

## 2023-07-19 ENCOUNTER — Other Ambulatory Visit: Payer: Self-pay

## 2023-07-19 DIAGNOSIS — K219 Gastro-esophageal reflux disease without esophagitis: Secondary | ICD-10-CM

## 2023-07-19 MED ORDER — PANTOPRAZOLE SODIUM 20 MG PO TBEC: 20.0000 mg | DELAYED_RELEASE_TABLET | Freq: Every day | ORAL | 2 refills | Status: AC

## 2023-07-21 DIAGNOSIS — M9905 Segmental and somatic dysfunction of pelvic region: Secondary | ICD-10-CM | POA: Diagnosis not present

## 2023-07-21 DIAGNOSIS — M9903 Segmental and somatic dysfunction of lumbar region: Secondary | ICD-10-CM | POA: Diagnosis not present

## 2023-07-21 DIAGNOSIS — M9902 Segmental and somatic dysfunction of thoracic region: Secondary | ICD-10-CM | POA: Diagnosis not present

## 2023-07-21 DIAGNOSIS — M9904 Segmental and somatic dysfunction of sacral region: Secondary | ICD-10-CM | POA: Diagnosis not present

## 2023-07-21 DIAGNOSIS — M51362 Other intervertebral disc degeneration, lumbar region with discogenic back pain and lower extremity pain: Secondary | ICD-10-CM | POA: Diagnosis not present

## 2023-07-21 DIAGNOSIS — M5386 Other specified dorsopathies, lumbar region: Secondary | ICD-10-CM | POA: Diagnosis not present

## 2023-07-21 DIAGNOSIS — M5414 Radiculopathy, thoracic region: Secondary | ICD-10-CM | POA: Diagnosis not present

## 2023-07-21 DIAGNOSIS — M5417 Radiculopathy, lumbosacral region: Secondary | ICD-10-CM | POA: Diagnosis not present

## 2023-07-25 DIAGNOSIS — M9905 Segmental and somatic dysfunction of pelvic region: Secondary | ICD-10-CM | POA: Diagnosis not present

## 2023-07-25 DIAGNOSIS — M5414 Radiculopathy, thoracic region: Secondary | ICD-10-CM | POA: Diagnosis not present

## 2023-07-25 DIAGNOSIS — M5386 Other specified dorsopathies, lumbar region: Secondary | ICD-10-CM | POA: Diagnosis not present

## 2023-07-25 DIAGNOSIS — M9902 Segmental and somatic dysfunction of thoracic region: Secondary | ICD-10-CM | POA: Diagnosis not present

## 2023-07-25 DIAGNOSIS — M9904 Segmental and somatic dysfunction of sacral region: Secondary | ICD-10-CM | POA: Diagnosis not present

## 2023-07-25 DIAGNOSIS — M51362 Other intervertebral disc degeneration, lumbar region with discogenic back pain and lower extremity pain: Secondary | ICD-10-CM | POA: Diagnosis not present

## 2023-07-25 DIAGNOSIS — M5417 Radiculopathy, lumbosacral region: Secondary | ICD-10-CM | POA: Diagnosis not present

## 2023-07-25 DIAGNOSIS — M9903 Segmental and somatic dysfunction of lumbar region: Secondary | ICD-10-CM | POA: Diagnosis not present

## 2023-07-28 DIAGNOSIS — M5386 Other specified dorsopathies, lumbar region: Secondary | ICD-10-CM | POA: Diagnosis not present

## 2023-07-28 DIAGNOSIS — M9902 Segmental and somatic dysfunction of thoracic region: Secondary | ICD-10-CM | POA: Diagnosis not present

## 2023-07-28 DIAGNOSIS — M51362 Other intervertebral disc degeneration, lumbar region with discogenic back pain and lower extremity pain: Secondary | ICD-10-CM | POA: Diagnosis not present

## 2023-07-28 DIAGNOSIS — M5414 Radiculopathy, thoracic region: Secondary | ICD-10-CM | POA: Diagnosis not present

## 2023-07-28 DIAGNOSIS — M9903 Segmental and somatic dysfunction of lumbar region: Secondary | ICD-10-CM | POA: Diagnosis not present

## 2023-07-28 DIAGNOSIS — M5417 Radiculopathy, lumbosacral region: Secondary | ICD-10-CM | POA: Diagnosis not present

## 2023-07-28 DIAGNOSIS — M9905 Segmental and somatic dysfunction of pelvic region: Secondary | ICD-10-CM | POA: Diagnosis not present

## 2023-07-28 DIAGNOSIS — M9904 Segmental and somatic dysfunction of sacral region: Secondary | ICD-10-CM | POA: Diagnosis not present

## 2023-08-01 DIAGNOSIS — M9905 Segmental and somatic dysfunction of pelvic region: Secondary | ICD-10-CM | POA: Diagnosis not present

## 2023-08-01 DIAGNOSIS — M51362 Other intervertebral disc degeneration, lumbar region with discogenic back pain and lower extremity pain: Secondary | ICD-10-CM | POA: Diagnosis not present

## 2023-08-01 DIAGNOSIS — M5414 Radiculopathy, thoracic region: Secondary | ICD-10-CM | POA: Diagnosis not present

## 2023-08-01 DIAGNOSIS — M5386 Other specified dorsopathies, lumbar region: Secondary | ICD-10-CM | POA: Diagnosis not present

## 2023-08-01 DIAGNOSIS — M9904 Segmental and somatic dysfunction of sacral region: Secondary | ICD-10-CM | POA: Diagnosis not present

## 2023-08-01 DIAGNOSIS — M9903 Segmental and somatic dysfunction of lumbar region: Secondary | ICD-10-CM | POA: Diagnosis not present

## 2023-08-01 DIAGNOSIS — M9902 Segmental and somatic dysfunction of thoracic region: Secondary | ICD-10-CM | POA: Diagnosis not present

## 2023-08-01 DIAGNOSIS — M5417 Radiculopathy, lumbosacral region: Secondary | ICD-10-CM | POA: Diagnosis not present

## 2023-08-04 DIAGNOSIS — M5417 Radiculopathy, lumbosacral region: Secondary | ICD-10-CM | POA: Diagnosis not present

## 2023-08-04 DIAGNOSIS — M9903 Segmental and somatic dysfunction of lumbar region: Secondary | ICD-10-CM | POA: Diagnosis not present

## 2023-08-04 DIAGNOSIS — M9905 Segmental and somatic dysfunction of pelvic region: Secondary | ICD-10-CM | POA: Diagnosis not present

## 2023-08-04 DIAGNOSIS — M9904 Segmental and somatic dysfunction of sacral region: Secondary | ICD-10-CM | POA: Diagnosis not present

## 2023-08-04 DIAGNOSIS — M5386 Other specified dorsopathies, lumbar region: Secondary | ICD-10-CM | POA: Diagnosis not present

## 2023-08-04 DIAGNOSIS — M51362 Other intervertebral disc degeneration, lumbar region with discogenic back pain and lower extremity pain: Secondary | ICD-10-CM | POA: Diagnosis not present

## 2023-08-04 DIAGNOSIS — M5414 Radiculopathy, thoracic region: Secondary | ICD-10-CM | POA: Diagnosis not present

## 2023-08-04 DIAGNOSIS — M9902 Segmental and somatic dysfunction of thoracic region: Secondary | ICD-10-CM | POA: Diagnosis not present

## 2023-08-10 ENCOUNTER — Encounter: Payer: Self-pay | Admitting: Nurse Practitioner

## 2023-08-10 DIAGNOSIS — M9903 Segmental and somatic dysfunction of lumbar region: Secondary | ICD-10-CM | POA: Diagnosis not present

## 2023-08-10 DIAGNOSIS — M5386 Other specified dorsopathies, lumbar region: Secondary | ICD-10-CM | POA: Diagnosis not present

## 2023-08-10 DIAGNOSIS — M9904 Segmental and somatic dysfunction of sacral region: Secondary | ICD-10-CM | POA: Diagnosis not present

## 2023-08-10 DIAGNOSIS — M5417 Radiculopathy, lumbosacral region: Secondary | ICD-10-CM | POA: Diagnosis not present

## 2023-08-10 DIAGNOSIS — M9902 Segmental and somatic dysfunction of thoracic region: Secondary | ICD-10-CM | POA: Diagnosis not present

## 2023-08-10 DIAGNOSIS — M51362 Other intervertebral disc degeneration, lumbar region with discogenic back pain and lower extremity pain: Secondary | ICD-10-CM | POA: Diagnosis not present

## 2023-08-10 DIAGNOSIS — M5414 Radiculopathy, thoracic region: Secondary | ICD-10-CM | POA: Diagnosis not present

## 2023-08-10 DIAGNOSIS — M9905 Segmental and somatic dysfunction of pelvic region: Secondary | ICD-10-CM | POA: Diagnosis not present

## 2023-08-17 DIAGNOSIS — M5417 Radiculopathy, lumbosacral region: Secondary | ICD-10-CM | POA: Diagnosis not present

## 2023-08-17 DIAGNOSIS — M5386 Other specified dorsopathies, lumbar region: Secondary | ICD-10-CM | POA: Diagnosis not present

## 2023-08-17 DIAGNOSIS — M51362 Other intervertebral disc degeneration, lumbar region with discogenic back pain and lower extremity pain: Secondary | ICD-10-CM | POA: Diagnosis not present

## 2023-08-17 DIAGNOSIS — M5414 Radiculopathy, thoracic region: Secondary | ICD-10-CM | POA: Diagnosis not present

## 2023-08-17 DIAGNOSIS — M9903 Segmental and somatic dysfunction of lumbar region: Secondary | ICD-10-CM | POA: Diagnosis not present

## 2023-08-17 DIAGNOSIS — M9902 Segmental and somatic dysfunction of thoracic region: Secondary | ICD-10-CM | POA: Diagnosis not present

## 2023-08-17 DIAGNOSIS — M9905 Segmental and somatic dysfunction of pelvic region: Secondary | ICD-10-CM | POA: Diagnosis not present

## 2023-08-17 DIAGNOSIS — M9904 Segmental and somatic dysfunction of sacral region: Secondary | ICD-10-CM | POA: Diagnosis not present

## 2023-08-24 DIAGNOSIS — M5386 Other specified dorsopathies, lumbar region: Secondary | ICD-10-CM | POA: Diagnosis not present

## 2023-08-24 DIAGNOSIS — M51362 Other intervertebral disc degeneration, lumbar region with discogenic back pain and lower extremity pain: Secondary | ICD-10-CM | POA: Diagnosis not present

## 2023-08-24 DIAGNOSIS — M9903 Segmental and somatic dysfunction of lumbar region: Secondary | ICD-10-CM | POA: Diagnosis not present

## 2023-08-24 DIAGNOSIS — M9904 Segmental and somatic dysfunction of sacral region: Secondary | ICD-10-CM | POA: Diagnosis not present

## 2023-08-24 DIAGNOSIS — M5417 Radiculopathy, lumbosacral region: Secondary | ICD-10-CM | POA: Diagnosis not present

## 2023-08-24 DIAGNOSIS — M9902 Segmental and somatic dysfunction of thoracic region: Secondary | ICD-10-CM | POA: Diagnosis not present

## 2023-08-24 DIAGNOSIS — M9905 Segmental and somatic dysfunction of pelvic region: Secondary | ICD-10-CM | POA: Diagnosis not present

## 2023-08-24 DIAGNOSIS — M5414 Radiculopathy, thoracic region: Secondary | ICD-10-CM | POA: Diagnosis not present

## 2023-08-31 DIAGNOSIS — M9903 Segmental and somatic dysfunction of lumbar region: Secondary | ICD-10-CM | POA: Diagnosis not present

## 2023-08-31 DIAGNOSIS — M9905 Segmental and somatic dysfunction of pelvic region: Secondary | ICD-10-CM | POA: Diagnosis not present

## 2023-08-31 DIAGNOSIS — M51362 Other intervertebral disc degeneration, lumbar region with discogenic back pain and lower extremity pain: Secondary | ICD-10-CM | POA: Diagnosis not present

## 2023-08-31 DIAGNOSIS — M9902 Segmental and somatic dysfunction of thoracic region: Secondary | ICD-10-CM | POA: Diagnosis not present

## 2023-08-31 DIAGNOSIS — M5414 Radiculopathy, thoracic region: Secondary | ICD-10-CM | POA: Diagnosis not present

## 2023-08-31 DIAGNOSIS — M5417 Radiculopathy, lumbosacral region: Secondary | ICD-10-CM | POA: Diagnosis not present

## 2023-08-31 DIAGNOSIS — M5386 Other specified dorsopathies, lumbar region: Secondary | ICD-10-CM | POA: Diagnosis not present

## 2023-08-31 DIAGNOSIS — M9904 Segmental and somatic dysfunction of sacral region: Secondary | ICD-10-CM | POA: Diagnosis not present

## 2023-09-06 ENCOUNTER — Encounter: Payer: Self-pay | Admitting: Nurse Practitioner

## 2023-09-07 DIAGNOSIS — M9903 Segmental and somatic dysfunction of lumbar region: Secondary | ICD-10-CM | POA: Diagnosis not present

## 2023-09-07 DIAGNOSIS — L281 Prurigo nodularis: Secondary | ICD-10-CM | POA: Diagnosis not present

## 2023-09-07 DIAGNOSIS — L439 Lichen planus, unspecified: Secondary | ICD-10-CM | POA: Diagnosis not present

## 2023-09-07 DIAGNOSIS — M51362 Other intervertebral disc degeneration, lumbar region with discogenic back pain and lower extremity pain: Secondary | ICD-10-CM | POA: Diagnosis not present

## 2023-09-07 DIAGNOSIS — L810 Postinflammatory hyperpigmentation: Secondary | ICD-10-CM | POA: Diagnosis not present

## 2023-09-07 DIAGNOSIS — M9904 Segmental and somatic dysfunction of sacral region: Secondary | ICD-10-CM | POA: Diagnosis not present

## 2023-09-07 DIAGNOSIS — M9902 Segmental and somatic dysfunction of thoracic region: Secondary | ICD-10-CM | POA: Diagnosis not present

## 2023-09-07 DIAGNOSIS — M5417 Radiculopathy, lumbosacral region: Secondary | ICD-10-CM | POA: Diagnosis not present

## 2023-09-07 DIAGNOSIS — M5386 Other specified dorsopathies, lumbar region: Secondary | ICD-10-CM | POA: Diagnosis not present

## 2023-09-07 DIAGNOSIS — M5414 Radiculopathy, thoracic region: Secondary | ICD-10-CM | POA: Diagnosis not present

## 2023-09-07 DIAGNOSIS — M9905 Segmental and somatic dysfunction of pelvic region: Secondary | ICD-10-CM | POA: Diagnosis not present

## 2023-09-14 ENCOUNTER — Ambulatory Visit: Admitting: Nurse Practitioner

## 2023-09-14 VITALS — BP 130/60 | HR 60 | Temp 98.0°F | Ht 69.0 in | Wt 179.6 lb

## 2023-09-14 DIAGNOSIS — M25511 Pain in right shoulder: Secondary | ICD-10-CM

## 2023-09-14 DIAGNOSIS — H9313 Tinnitus, bilateral: Secondary | ICD-10-CM | POA: Diagnosis not present

## 2023-09-14 DIAGNOSIS — G8929 Other chronic pain: Secondary | ICD-10-CM

## 2023-09-14 DIAGNOSIS — M25562 Pain in left knee: Secondary | ICD-10-CM

## 2023-09-14 DIAGNOSIS — Z6826 Body mass index (BMI) 26.0-26.9, adult: Secondary | ICD-10-CM

## 2023-09-14 DIAGNOSIS — M25561 Pain in right knee: Secondary | ICD-10-CM

## 2023-09-14 DIAGNOSIS — E663 Overweight: Secondary | ICD-10-CM

## 2023-09-14 NOTE — Assessment & Plan Note (Signed)
 Tenderness to anterior bursa and pain with Hawkins to anterior shoulder. I have advised him to contact his orthopedic since he is established

## 2023-09-14 NOTE — Assessment & Plan Note (Signed)
 He is to f/u with his orthopedic

## 2023-09-14 NOTE — Assessment & Plan Note (Signed)
 He has seen ENT and officially diagnosed with tinnitus, will refer to [facility] tinnitus clinic

## 2023-09-14 NOTE — Progress Notes (Signed)
 Madelaine Bhat, CMA,acting as a Neurosurgeon for Arnette Felts, FNP.,have documented all relevant documentation on the behalf of Arnette Felts, FNP,as directed by  Arnette Felts, FNP while in the presence of Arnette Felts, FNP.  Subjective:  Patient ID: Robert Donaldson , male    DOB: [birth year] , 68 y.o.   MRN: [id number]  Chief Complaint  Patient presents with   Tinnitus    HPI  Patient presents today for a ringing in both ears, he has seen an ENT about 18 months ago, right shoulder and bilateral knee pain. Patient reports he has had ringing in ears for years he has tinnitus but it is getting worse. Patient reports he does go to [facility] for knee pain already. Patient reports the shoulder pain is new - about one week ago. He started swimming again in January. When he stretches he has pain to his shoulder. He has tried the meloxicam without relief. He has not tried heat or ice packs. He has been having difficulty with swimming.     Past Medical History:  Diagnosis Date   BPH (benign prostatic hyperplasia)    Gastroesophageal reflux disease 12/09/2022   HLD (hyperlipidemia)    Pancreatitis    Vitamin D deficiency      Family History  Problem Relation Age of Onset   Kidney disease Mother    Hyperlipidemia Sister    Diabetes Paternal Grandmother      Current Outpatient Medications:    acyclovir ointment (ZOVIRAX) 5 %, APPLY 1 APPLICATION TOPICALLY EVERY 3 HOURS, Disp: 30 g, Rfl: 11   amoxicillin-clavulanate (AUGMENTIN) 875-125 MG tablet, Take 1 tablet by mouth 2 (two) times daily., Disp: 28 tablet, Rfl: 0   celecoxib (CELEBREX) 200 MG capsule, Take 200 mg by mouth daily. Takes as needed, Disp: , Rfl:    Cholecalciferol 125 MCG (5000 UT) TABS, Take 125 mcg by mouth daily. Taking 1 tablet by mouth daily., Disp: , Rfl:    colchicine 0.6 MG tablet, Take 1 tablet (0.6 mg total) by mouth daily as needed (gout)., Disp: 20 tablet, Rfl: 0   cyclobenzaprine  (FLEXERIL) 10 MG tablet, Take 1 tablet (10 mg total) by mouth 3 (three) times daily as needed for muscle spasms., Disp: 30 tablet, Rfl: 0   diclofenac Sodium (VOLTAREN) 1 % GEL, Apply 2 g topically 4 (four) times daily., Disp: 100 g, Rfl: 2   hydroxychloroquine (PLAQUENIL) 200 MG tablet, Take 1 tablet (200 mg total) by mouth 2 (two) times daily., Disp: 60 tablet, Rfl: 0   loratadine (CLARITIN) 10 MG tablet, Take 1 tablet (10 mg total) by mouth daily., Disp: 90 tablet, Rfl: 1   Multiple Vitamins-Minerals (ADULT GUMMY PO), Take 1 each by mouth in the morning, at noon, and at bedtime. FIBER, Disp: , Rfl:    OPZELURA 1.5 % CREA, , Disp: , Rfl:    pantoprazole (PROTONIX) 20 MG tablet, Take 1 tablet (20 mg total) by mouth daily., Disp: 30 tablet, Rfl: 2   rosuvastatin (CRESTOR) 20 MG tablet, Take 1 tablet (20 mg total) by mouth daily., Disp: 90 tablet, Rfl: 2   tamsulosin (FLOMAX) 0.4 MG CAPS capsule, Take 0.4 mg by mouth at bedtime., Disp: , Rfl:    vitamin E 200 UNIT capsule, Take 5,000 Units by mouth daily. , Disp: , Rfl:    ferrous sulfate 325 (65 FE) MG EC tablet, Take 1 tablet (325 mg total) by mouth 2 (two) times daily., Disp: 180 tablet, Rfl: 1  No Known Allergies   Review of Systems  Constitutional: Negative.   HENT:  Positive for tinnitus.   Respiratory: Negative.    Cardiovascular: Negative.   Musculoskeletal:  Positive for arthralgias (bilateral knees).       Right shoulder pain  Neurological: Negative.   Psychiatric/Behavioral: Negative.       Today's Vitals   09/14/23 1453  BP: 130/60  Pulse: 60  Temp: 98 F (36.7 C)  TempSrc: Oral  Weight: 179 lb 9.6 oz (81.5 kg)  Height: 5\' 9"  (1.753 m)  PainSc: 7   PainLoc: Shoulder   Body mass index is 26.52 kg/m.  Wt Readings from Last 3 Encounters:  09/14/23 179 lb 9.6 oz (81.5 kg)  04/13/23 176 lb 6.4 oz (80 kg)  04/12/23 178 lb 9.6 oz (81 kg)     Objective:  Physical Exam Vitals reviewed.  Constitutional:       General: He is not in acute distress.    Appearance: Normal appearance. He is normal weight.  HENT:     Right Ear: Tympanic membrane, ear canal and external ear normal. There is no impacted cerumen.     Left Ear: Tympanic membrane, ear canal and external ear normal. There is no impacted cerumen.  Cardiovascular:     Rate and Rhythm: Normal rate and regular rhythm.     Pulses: Normal pulses.     Heart sounds: Normal heart sounds. No murmur heard. Pulmonary:     Effort: Pulmonary effort is normal. No respiratory distress.     Breath sounds: Normal breath sounds. No wheezing.  Musculoskeletal:        General: Tenderness (anterior right shoulder bursa space) present. No deformity.     Comments: Able to raise arm with pain. Pain with Hawkins test anteriorly  Skin:    General: Skin is warm and dry.     Capillary Refill: Capillary refill takes less than 2 seconds.  Neurological:     General: No focal deficit present.     Mental Status: He is alert and oriented to person, place, and time.     Cranial Nerves: No cranial nerve deficit.     Motor: No weakness.  Psychiatric:        Mood and Affect: Mood normal.        Behavior: Behavior normal.        Thought Content: Thought content normal.        Judgment: Judgment normal.         Assessment And Plan:  Tinnitus of both ears Assessment & Plan: He has seen ENT and officially diagnosed with tinnitus, will refer to [facility] tinnitus clinic  Orders: -     Ambulatory referral to ENT  Acute pain of right shoulder Assessment & Plan: Tenderness to anterior bursa and pain with Hawkins to anterior shoulder. I have advised him to contact his orthopedic since he is established   Chronic pain of both knees Assessment & Plan: He is to f/u with his orthopedic   Overweight with body mass index (BMI) of 26 to 26.9 in adult    No follow-ups on file.  Patient was given opportunity to ask questions. Patient verbalized understanding of the plan  and was able to repeat key elements of the plan. All questions were answered to their satisfaction.    Jeanell Sparrow, FNP, have reviewed all documentation for this visit. The documentation on 09/14/23 for the exam, diagnosis, procedures, and orders are all accurate and complete.   IF  YOU HAVE BEEN REFERRED TO A SPECIALIST, IT MAY TAKE 1-2 WEEKS TO SCHEDULE/PROCESS THE REFERRAL. IF YOU HAVE NOT HEARD FROM US/SPECIALIST IN TWO WEEKS, PLEASE GIVE [place] A CALL AT [phone].

## 2023-09-16 ENCOUNTER — Encounter: Payer: Self-pay | Admitting: Nurse Practitioner

## 2023-09-16 DIAGNOSIS — M67911 Unspecified disorder of synovium and tendon, right shoulder: Secondary | ICD-10-CM | POA: Diagnosis not present

## 2023-09-16 DIAGNOSIS — M170 Bilateral primary osteoarthritis of knee: Secondary | ICD-10-CM | POA: Diagnosis not present

## 2023-09-26 ENCOUNTER — Encounter: Payer: Self-pay | Admitting: Nurse Practitioner

## 2023-09-27 ENCOUNTER — Encounter: Payer: Self-pay | Admitting: Gastroenterology

## 2023-09-29 ENCOUNTER — Ambulatory Visit: Payer: Self-pay | Admitting: Nurse Practitioner

## 2023-09-29 ENCOUNTER — Encounter: Payer: Self-pay | Admitting: Nurse Practitioner

## 2023-09-29 VITALS — BP 118/70 | HR 70 | Temp 98.8°F | Ht 69.0 in | Wt 183.0 lb

## 2023-09-29 DIAGNOSIS — E663 Overweight: Secondary | ICD-10-CM

## 2023-09-29 DIAGNOSIS — L03011 Cellulitis of right finger: Secondary | ICD-10-CM | POA: Insufficient documentation

## 2023-09-29 DIAGNOSIS — Z6827 Body mass index (BMI) 27.0-27.9, adult: Secondary | ICD-10-CM | POA: Diagnosis not present

## 2023-09-29 MED ORDER — CEPHALEXIN 500 MG PO CAPS: 500.0000 mg | ORAL_CAPSULE | Freq: Four times a day (QID) | ORAL | 0 refills | Status: AC

## 2023-09-29 NOTE — Progress Notes (Signed)
 [place], CMA,acting as a Neurosurgeon for Susanna Epley, FNP.,have documented all relevant documentation on the behalf of Susanna Epley, FNP,as directed by  Susanna Epley, FNP while in the presence of Susanna Epley, FNP.  Subjective:  Patient ID: Robert Donaldson , male    DOB: 1955/12/17 , 68 y.o.   MRN: [id number]  No chief complaint on file.   HPI  Patient presents today for a ingrown fingernail on his right hand ring finger. Patient reports it started this weekend.       Past Medical History:  Diagnosis Date   Arthritis 2023?   Knees and shoulders   BPH (benign prostatic hyperplasia)    Gastroesophageal reflux disease 12/09/2022   HLD (hyperlipidemia)    Pancreatitis    Vitamin D deficiency      Family History  Problem Relation Age of Onset   Kidney disease Mother    Hyperlipidemia Sister    Diabetes Paternal Grandmother      Current Outpatient Medications:    acyclovir  ointment (ZOVIRAX ) 5 %, APPLY 1 APPLICATION TOPICALLY EVERY 3 HOURS, Disp: 30 g, Rfl: 11   celecoxib (CELEBREX) 200 MG capsule, Take 200 mg by mouth daily. Takes as needed, Disp: , Rfl:    cephALEXin  (KEFLEX ) 500 MG capsule, Take 1 capsule (500 mg total) by mouth 4 (four) times daily for 10 days., Disp: 40 capsule, Rfl: 0   Cholecalciferol 125 MCG (5000 UT) TABS, Take 125 mcg by mouth daily. Taking 1 tablet by mouth daily., Disp: , Rfl:    colchicine  0.6 MG tablet, Take 1 tablet (0.6 mg total) by mouth daily as needed (gout)., Disp: 20 tablet, Rfl: 0   cyclobenzaprine  (FLEXERIL ) 10 MG tablet, Take 1 tablet (10 mg total) by mouth 3 (three) times daily as needed for muscle spasms., Disp: 30 tablet, Rfl: 0   diclofenac  Sodium (VOLTAREN ) 1 % GEL, Apply 2 g topically 4 (four) times daily., Disp: 100 g, Rfl: 2   hydroxychloroquine  (PLAQUENIL ) 200 MG tablet, Take 1 tablet (200 mg total) by mouth 2 (two) times daily., Disp: 60 tablet, Rfl: 0   Multiple Vitamins-Minerals (ADULT GUMMY PO), Take 1 each by mouth in the  morning, at noon, and at bedtime. FIBER, Disp: , Rfl:    OPZELURA 1.5 % CREA, , Disp: , Rfl:    pantoprazole  (PROTONIX ) 20 MG tablet, Take 1 tablet (20 mg total) by mouth daily., Disp: 30 tablet, Rfl: 2   rosuvastatin  (CRESTOR ) 20 MG tablet, Take 1 tablet (20 mg total) by mouth daily., Disp: 90 tablet, Rfl: 2   tamsulosin (FLOMAX) 0.4 MG CAPS capsule, Take 0.4 mg by mouth at bedtime., Disp: , Rfl:    vitamin E 200 UNIT capsule, Take 5,000 Units by mouth daily. , Disp: , Rfl:    ferrous sulfate  325 (65 FE) MG EC tablet, Take 1 tablet (325 mg total) by mouth 2 (two) times daily., Disp: 180 tablet, Rfl: 1   No Known Allergies   Review of Systems  Constitutional: Negative.   Respiratory: Negative.    Cardiovascular: Negative.   Genitourinary: Negative.   Skin:        Right ring finger has slightly swollen area.   Neurological: Negative.   Psychiatric/Behavioral: Negative.       Today's Vitals   09/29/23 0909  BP: 118/70  Pulse: 70  Temp: 98.8 F (37.1 C)  TempSrc: Oral  Weight: 183 lb (83 kg)  Height: 5\' 9"  (1.753 m)  PainSc: 5   PainLoc: Finger  Body mass index is 27.02 kg/m.  Wt Readings from Last 3 Encounters:  09/29/23 183 lb (83 kg)  09/14/23 179 lb 9.6 oz (81.5 kg)  04/13/23 176 lb 6.4 oz (80 kg)     Objective:  Physical Exam Vitals and nursing note reviewed.  Constitutional:      General: He is not in acute distress.    Appearance: Normal appearance.  Cardiovascular:     Pulses: Normal pulses.     Heart sounds: Normal heart sounds. No murmur heard. Pulmonary:     Effort: Pulmonary effort is normal. No respiratory distress.     Breath sounds: Normal breath sounds. No wheezing.  Skin:    Capillary Refill: Capillary refill takes less than 2 seconds.     Comments: Right ring finger with whitened and tender area to cuticle.  Neurological:     General: No focal deficit present.     Mental Status: He is alert and oriented to person, place, and time.   Psychiatric:        Mood and Affect: Mood normal.        Behavior: Behavior normal.        Thought Content: Thought content normal.        Judgment: Judgment normal.         Assessment And Plan:  Overweight with body mass index (BMI) of 27 to 27.9 in adult  Paronychia of right ring finger Assessment & Plan: Cleansed area with NS and used a scalpel to open slightly, yellow exudate present and sent culture. He is to cleanse with antibacterial soap and apply bacitracin  ointment daily.  Orders: -     Cephalexin ; Take 1 capsule (500 mg total) by mouth 4 (four) times daily for 10 days.  Dispense: 40 capsule; Refill: 0 -     WOUND CULTURE    Return for keep same next.  Patient was given opportunity to ask questions. Patient verbalized understanding of the plan and was able to repeat key elements of the plan. All questions were answered to their satisfaction.   Inge Mangle, FNP, have reviewed all documentation for this visit. The documentation on 09/29/23 for the exam, diagnosis, procedures, and orders are all accurate and complete.    IF YOU HAVE BEEN REFERRED TO A SPECIALIST, IT MAY TAKE 1-2 WEEKS TO SCHEDULE/PROCESS THE REFERRAL. IF YOU HAVE NOT HEARD FROM US /SPECIALIST IN TWO WEEKS, PLEASE GIVE US  A CALL AT [phone].

## 2023-09-29 NOTE — Patient Instructions (Addendum)
 Cleanse with antibacterial soap and dry apply neosporin/bacitracin daily.

## 2023-09-30 ENCOUNTER — Ambulatory Visit: Payer: Medicare PPO

## 2023-09-30 DIAGNOSIS — Z0000 Encounter for general adult medical examination without abnormal findings: Secondary | ICD-10-CM

## 2023-09-30 DIAGNOSIS — M67911 Unspecified disorder of synovium and tendon, right shoulder: Secondary | ICD-10-CM | POA: Diagnosis not present

## 2023-09-30 NOTE — Progress Notes (Signed)
 Subjective:   Robert Donaldson is a 68 y.o. male who presents for Medicare Annual/Subsequent preventive examination.  Visit Complete: Virtual I connected with  Robert Donaldson on 09/30/23 by a audio enabled telemedicine application and verified that I am speaking with the correct person using two identifiers.  Patient Location: Home  Provider Location: Office/Clinic  I discussed the limitations of evaluation and management by telemedicine. The patient expressed understanding and agreed to proceed.  Vital Signs: Because this visit was a virtual/telehealth visit, some criteria may be missing or patient reported. Any vitals not documented were not able to be obtained and vitals that have been documented are patient reported.  Patient Medicare AWV questionnaire was completed by the patient on 09/30/2023; I have confirmed that all information answered by patient is correct and no changes since this date.        Objective:    There were no vitals filed for this visit. There is no height or weight on file to calculate BMI.     09/01/2022    9:02 AM 07/20/2022   12:09 PM 05/16/2022    1:35 PM 08/13/2021    9:10 AM 08/06/2020    9:17 AM 08/01/2019    8:50 AM 07/26/2018    1:59 PM  Advanced Directives  Does Patient Have a Medical Advance Directive? Yes Yes Yes No Yes Yes   Type of Estate agent of [place];Living will Living will;Healthcare Power of Attorney Living will  Healthcare Power of [place];Living will Healthcare Power of [place];Living will   Does patient want to make changes to medical advance directive?  No - Patient declined       Copy of Healthcare Power of Attorney in Chart? No - copy requested    No - copy requested No - copy requested   Would patient like information on creating a medical advance directive?    No - Patient declined   Yes (MAU/Ambulatory/Procedural Areas - Information given)    Current Medications (verified) Outpatient Encounter  Medications as of 09/30/2023  Medication Sig   acyclovir ointment (ZOVIRAX) 5 % APPLY 1 APPLICATION TOPICALLY EVERY 3 HOURS   celecoxib (CELEBREX) 200 MG capsule Take 200 mg by mouth daily. Takes as needed   cephALEXin (KEFLEX) 500 MG capsule Take 1 capsule (500 mg total) by mouth 4 (four) times daily for 10 days.   Cholecalciferol 125 MCG (5000 UT) TABS Take 125 mcg by mouth daily. Taking 1 tablet by mouth daily.   colchicine 0.6 MG tablet Take 1 tablet (0.6 mg total) by mouth daily as needed (gout).   cyclobenzaprine (FLEXERIL) 10 MG tablet Take 1 tablet (10 mg total) by mouth 3 (three) times daily as needed for muscle spasms.   diclofenac Sodium (VOLTAREN) 1 % GEL Apply 2 g topically 4 (four) times daily.   ferrous sulfate 325 (65 FE) MG EC tablet Take 1 tablet (325 mg total) by mouth 2 (two) times daily.   hydroxychloroquine (PLAQUENIL) 200 MG tablet Take 1 tablet (200 mg total) by mouth 2 (two) times daily.   Multiple Vitamins-Minerals (ADULT GUMMY PO) Take 1 each by mouth in the morning, at noon, and at bedtime. FIBER   OPZELURA 1.5 % CREA    pantoprazole (PROTONIX) 20 MG tablet Take 1 tablet (20 mg total) by mouth daily.   rosuvastatin (CRESTOR) 20 MG tablet Take 1 tablet (20 mg total) by mouth daily.   tamsulosin (FLOMAX) 0.4 MG CAPS capsule Take 0.4 mg by mouth at bedtime.  vitamin E 200 UNIT capsule Take 5,000 Units by mouth daily.    No facility-administered encounter medications on file as of 09/30/2023.    Allergies (verified) Patient has no known allergies.   History: Past Medical History:  Diagnosis Date   Arthritis 2023?   Knees and shoulders   BPH (benign prostatic hyperplasia)    Gastroesophageal reflux disease 12/09/2022   HLD (hyperlipidemia)    Pancreatitis    Vitamin D deficiency    Past Surgical History:  Procedure Laterality Date   KNEE SURGERY     Family History  Problem Relation Age of Onset   Kidney disease Mother    Hyperlipidemia Sister     Diabetes Paternal Grandmother    Social History   Socioeconomic History   Marital status: Married    Spouse name: Not on file   Number of children: Not on file   Years of education: Not on file   Highest education level: Master's degree (e.g., MA, MS, MEng, MEd, MSW, MBA)  Occupational History   Occupation: retired  Tobacco Use   Smoking status: Former    Types: Cigars    Start date: 06/21/1989    Quit date: 03/26/2013    Years since quitting: 10.5   Smokeless tobacco: Never   Tobacco comments:    Quit cigarettes in 1978, started cigars around 2004  Vaping Use   Vaping status: Never Used  Substance and Sexual Activity   Alcohol use: Not Currently    Comment: occassionally   Drug use: No   Sexual activity: Yes    Birth control/protection: Abstinence, Post-menopausal  Other Topics Concern   Not on file  Social History Narrative   Not on file   Social Drivers of Health   Financial Resource Strain: Low Risk  (09/14/2023)   Overall Financial Resource Strain (CARDIA)    Difficulty of Paying Living Expenses: Not hard at all  Food Insecurity: No Food Insecurity (09/14/2023)   Hunger Vital Sign    Worried About Running Out of Food in the Last Year: Never true    Ran Out of Food in the Last Year: Never true  Transportation Needs: No Transportation Needs (09/14/2023)   PRAPARE - Administrator, Civil Service (Medical): No    Lack of Transportation (Non-Medical): No  Physical Activity: Unknown (09/14/2023)   Exercise Vital Sign    Days of Exercise per Week: 0 days    Minutes of Exercise per Session: Not on file  Stress: Stress Concern Present (09/14/2023)   [employer] of Occupational Health - Occupational Stress Questionnaire    Feeling of Stress : To some extent  Social Connections: Socially Integrated (09/14/2023)   Social Connection and Isolation Panel [NHANES]    Frequency of Communication with Friends and Family: More than three times a week    Frequency of  Social Gatherings with Friends and Family: Never    Attends Religious Services: More than 4 times per year    Active Member of [employer] or Organizations: Yes    Attends Engineer, structural: More than 4 times per year    Marital Status: Married    Tobacco Counseling Counseling given: Not Answered Tobacco comments: Quit cigarettes in 1978, started cigars around 2004   Clinical Intake:                        Activities of Daily Living    09/26/2023   11:14 AM  In your present state  of health, do you have any difficulty performing the following activities:  Hearing? 1  Vision? 0  Difficulty concentrating or making decisions? 0  Walking or climbing stairs? 0  Dressing or bathing? 0  Doing errands, shopping? 0  Preparing Food and eating ? N  Using the Toilet? N  In the past six months, have you accidently leaked urine? N  Do you have problems with loss of bowel control? N  Managing your Medications? N  Managing your Finances? N  Housekeeping or managing your Housekeeping? N    Patient Care Team: Arnette Felts, FNP as PCP - General (General Practice) Janalyn Harder, MD (Inactive) as Consulting Physician (Dermatology) Harlan Stains, [facility] (Inactive) (Pharmacist)  Indicate any recent Medical Services you may have received from other than [facility] providers in the past year (date may be approximate).     Assessment:   This is a routine wellness examination for [facility].  Hearing/Vision screen No results found.   Goals Addressed   None    Depression Screen    10/12/2022    8:42 AM 09/01/2022    9:02 AM 07/29/2022   12:17 PM 04/12/2022    8:22 AM 01/21/2022    3:03 PM 08/13/2021    9:11 AM 08/06/2020    9:18 AM  PHQ 2/9 Scores  PHQ - 2 Score 0 0 0 0 0 0 0    Fall Risk    09/26/2023   11:14 AM 10/12/2022    8:42 AM 09/01/2022    9:02 AM 07/29/2022   12:15 PM 04/12/2022    8:22 AM  Fall Risk   Falls in the past year? 0 0 0 0 0  Number falls in past yr:    0  0  Injury with Fall? 0  0  0  Risk for fall due to :   Medication side effect  No Fall Risks  Follow up   Falls prevention discussed;Education provided;Falls evaluation completed  Falls evaluation completed    MEDICARE RISK AT HOME: Medicare Risk at Home Any stairs in or around the home?: (Patient-Rptd) Yes If so, are there any without handrails?: (Patient-Rptd) No Home free of loose throw rugs in walkways, pet beds, electrical cords, etc?: (Patient-Rptd) Yes Adequate lighting in your home to reduce risk of falls?: (Patient-Rptd) Yes Life alert?: (Patient-Rptd) No Use of a cane, walker or w/c?: (Patient-Rptd) No Grab bars in the bathroom?: (Patient-Rptd) No Shower chair or bench in shower?: (Patient-Rptd) No Elevated toilet seat or a handicapped toilet?: (Patient-Rptd) Yes  TIMED UP AND GO:  Was the test performed?  No    Cognitive Function:    07/26/2018   11:42 AM  MMSE - Mini Mental State Exam  Orientation to time 5  Orientation to Place 5  Registration 3  Attention/ Calculation 5  Recall 3  Language- name 2 objects 2  Language- repeat 1  Language- follow 3 step command 3  Language- read & follow direction 1  Write a sentence 1        09/01/2022    9:03 AM 08/06/2020    9:19 AM 08/01/2019    8:52 AM  6CIT Screen  What Year? 0 points 0 points 0 points  What month? 0 points 0 points 0 points  What time? 0 points 0 points 0 points  Count back from 20 0 points 0 points 0 points  Months in reverse 2 points 0 points 0 points  Repeat phrase 2 points 0 points 0 points  Total Score 4 points 0 points 0 points    Immunizations Immunization History  Administered Date(s) Administered   Fluad Quad(high Dose 65+) 04/12/2022   Hepatitis A, Adult 05/03/1995, 05/13/1996   Influenza Whole 05/03/1995, 05/13/1996, 03/29/1997   Influenza, High Dose Seasonal PF 05/22/2019   Influenza-Unspecified 04/01/2009, 06/05/2010, 05/03/2012, 05/03/2013, 05/03/2014   Moderna Covid-19 Fall  Seasonal Vaccine 37yrs & older 04/17/2023   OPV 04/21/1978   PFIZER(Purple Top)SARS-COV-2 Vaccination 07/16/2019, 08/06/2019, 04/14/2020   PNEUMOCOCCAL CONJUGATE-20 08/13/2021   Pfizer Covid-19 Vaccine Bivalent Booster 51yrs & up 03/31/2021   Pneumococcal Polysaccharide-23 07/26/2018   Smallpox 01/20/1980   Td 11/19/1988   Tdap 04/01/2009, 04/06/2015   Typhoid Parenteral, AKD ([place] [employer]) 07/22/1988   Yellow Fever 01/19/1977   Zoster Recombinant(Shingrix) 12/20/2019, 03/04/2020    TDAP status: Up to date  Flu Vaccine status: Up to date  Pneumococcal vaccine status: Up to date  Covid-19 vaccine status: Information provided on how to obtain vaccines.   Qualifies for Shingles Vaccine? Yes   Zostavax completed Yes   Shingrix Completed?: Yes  Screening Tests Health Maintenance  Topic Date Due   Medicare Annual Wellness (AWV)  09/01/2023   COVID-19 Vaccine (7 - Pfizer risk 2024-25 season) 10/16/2023   INFLUENZA VACCINE  01/20/2024   Colonoscopy  07/22/2024   DTaP/Tdap/Td (4 - Td or Tdap) 04/05/2025   Pneumonia Vaccine 40+ Years old  Completed   Hepatitis C Screening  Completed   Zoster Vaccines- Shingrix  Completed   HPV VACCINES  Aged Out   Meningococcal B Vaccine  Aged Out    Health Maintenance  Health Maintenance Due  Topic Date Due   Medicare Annual Wellness (AWV)  09/01/2023    Colorectal cancer screening: Type of screening: Colonoscopy. Completed 2016. Repeat every 10 years  Lung Cancer Screening: (Low Dose CT Chest recommended if Age 19-80 years, 20 pack-year currently smoking OR have quit w/in 15years.) does not qualify.   Lung Cancer Screening Referral: n/a  Additional Screening:  Hepatitis C Screening: does qualify; Completed 2022  Vision Screening: Recommended annual ophthalmology exams for early detection of glaucoma and other disorders of the eye. Is the patient up to date with their annual eye exam?  Yes  Who is the provider or what is the name of  the office in which the patient attends annual eye exams? [facility] If pt is not established with a provider, would they like to be referred to a provider to establish care? No .   Dental Screening: Recommended annual dental exams for proper oral hygiene  Diabetic Foot Exam: not diabetic  Community Resource Referral / Chronic Care Management: CRR required this visit?  No   CCM required this visit?  No     Plan:     I have personally reviewed and noted the following in the patient's chart:   Medical and social history Use of alcohol, tobacco or illicit drugs  Current medications and supplements including opioid prescriptions. Patient is not currently taking opioid prescriptions. Functional ability and status Nutritional status Physical activity Advanced directives List of other physicians Hospitalizations, surgeries, and ER visits in previous 12 months Vitals Screenings to include cognitive, depression, and falls Referrals and appointments  In addition, I have reviewed and discussed with patient certain preventive protocols, quality metrics, and best practice recommendations. A written personalized care plan for preventive services as well as general preventive health recommendations were provided to patient.     Marlyn Corporal, CMA   09/30/2023   After  Visit Summary: (MyChart) Due to this being a telephonic visit, the after visit summary with patients personalized plan was offered to patient via MyChart   Nurse Notes:

## 2023-10-04 LAB — WOUND CULTURE

## 2023-10-09 ENCOUNTER — Encounter: Payer: Self-pay | Admitting: Nurse Practitioner

## 2023-10-09 NOTE — Assessment & Plan Note (Signed)
 Cleansed area with NS and used a scalpel to open slightly, yellow exudate present and sent culture. He is to cleanse with antibacterial soap and apply bacitracin  ointment daily.

## 2023-10-17 ENCOUNTER — Ambulatory Visit (INDEPENDENT_AMBULATORY_CARE_PROVIDER_SITE_OTHER): Payer: Self-pay | Admitting: Nurse Practitioner

## 2023-10-17 ENCOUNTER — Encounter: Payer: Self-pay | Admitting: Nurse Practitioner

## 2023-10-17 VITALS — BP 112/70 | HR 62 | Temp 98.1°F | Ht 69.0 in | Wt 181.0 lb

## 2023-10-17 DIAGNOSIS — E782 Mixed hyperlipidemia: Secondary | ICD-10-CM | POA: Diagnosis not present

## 2023-10-17 DIAGNOSIS — R7309 Other abnormal glucose: Secondary | ICD-10-CM | POA: Diagnosis not present

## 2023-10-17 DIAGNOSIS — D508 Other iron deficiency anemias: Secondary | ICD-10-CM

## 2023-10-17 DIAGNOSIS — Z79899 Other long term (current) drug therapy: Secondary | ICD-10-CM | POA: Diagnosis not present

## 2023-10-17 DIAGNOSIS — I700 Atherosclerosis of aorta: Secondary | ICD-10-CM

## 2023-10-17 DIAGNOSIS — Z0000 Encounter for general adult medical examination without abnormal findings: Secondary | ICD-10-CM

## 2023-10-17 DIAGNOSIS — Z125 Encounter for screening for malignant neoplasm of prostate: Secondary | ICD-10-CM | POA: Diagnosis not present

## 2023-10-17 HISTORY — DX: Encounter for general adult medical examination without abnormal findings: Z00.00

## 2023-10-17 NOTE — Assessment & Plan Note (Signed)
HgbA1c was slightly elevated at last visit. Diet controlled. Continue focusing on healthy diet low in sugar and starches.

## 2023-10-17 NOTE — Progress Notes (Signed)
 I,Robert Donaldson, CMA,acting as a Neurosurgeon for [employer], FNP.,have documented all relevant documentation on the behalf of Robert Epley, FNP,as directed by  Robert Epley, FNP while in the presence of Robert Epley, FNP.  Subjective:   Patient ID: Robert Donaldson , male    DOB: [birth year] , 68 y.o.   MRN: [id number]  Chief Complaint  Patient presents with   Annual Exam    HPI  Patient presents today for a physical. Patient reports he is having shoulder pain this morning and he is followed by Ortho for this. He doesn't have any questions or concerns at this time.        Past Medical History:  Diagnosis Date   Arthritis 2023?   Knees and shoulders   BPH (benign prostatic hyperplasia)    Encounter for annual physical exam 10/17/2023   Gastroesophageal reflux disease 12/09/2022   HLD (hyperlipidemia)    Pancreatitis    Vitamin D deficiency      Family History  Problem Relation Age of Onset   Kidney disease Mother    Hyperlipidemia Sister    Diabetes Paternal Grandmother      Current Outpatient Medications:    acyclovir  ointment (ZOVIRAX ) 5 %, APPLY 1 APPLICATION TOPICALLY EVERY 3 HOURS, Disp: 30 g, Rfl: 11   celecoxib (CELEBREX) 200 MG capsule, Take 200 mg by mouth daily. Takes as needed, Disp: , Rfl:    Cholecalciferol 125 MCG (5000 UT) TABS, Take 125 mcg by mouth daily. Taking 1 tablet by mouth daily., Disp: , Rfl:    colchicine  0.6 MG tablet, Take 1 tablet (0.6 mg total) by mouth daily as needed (gout)., Disp: 20 tablet, Rfl: 0   cyclobenzaprine  (FLEXERIL ) 10 MG tablet, Take 1 tablet (10 mg total) by mouth 3 (three) times daily as needed for muscle spasms., Disp: 30 tablet, Rfl: 0   diclofenac  Sodium (VOLTAREN ) 1 % GEL, Apply 2 g topically 4 (four) times daily., Disp: 100 g, Rfl: 2   hydroxychloroquine  (PLAQUENIL ) 200 MG tablet, Take 1 tablet (200 mg total) by mouth 2 (two) times daily., Disp: 60 tablet, Rfl: 0   Multiple Vitamins-Minerals (ADULT GUMMY PO), Take 1 each by  mouth in the morning, at noon, and at bedtime. FIBER, Disp: , Rfl:    OPZELURA 1.5 % CREA, , Disp: , Rfl:    pantoprazole  (PROTONIX ) 20 MG tablet, Take 1 tablet (20 mg total) by mouth daily., Disp: 30 tablet, Rfl: 2   rosuvastatin  (CRESTOR ) 20 MG tablet, Take 1 tablet (20 mg total) by mouth daily., Disp: 90 tablet, Rfl: 2   tamsulosin (FLOMAX) 0.4 MG CAPS capsule, Take 0.4 mg by mouth at bedtime., Disp: , Rfl:    vitamin E 200 UNIT capsule, Take 5,000 Units by mouth daily. , Disp: , Rfl:    ferrous sulfate  325 (65 FE) MG EC tablet, Take 1 tablet (325 mg total) by mouth 2 (two) times daily., Disp: 180 tablet, Rfl: 1   No Known Allergies   Men's preventive visit. Patient Health Questionnaire (PHQ-2) is  Flowsheet Row Clinical Support from 09/30/2023 in [facility]  PHQ-2 Total Score 0     Patient is on a Regular diet; trying to avoid fried food. Exercise - Marital status: Married. Relevant history for alcohol use is:  Social History   Substance and Sexual Activity  Alcohol Use Not Currently   Alcohol/week: 3.0 standard drinks of alcohol   Types: 3 Shots of liquor per week   Comment: occassionally  Relevant history for tobacco use is:  Social History   Tobacco Use  Smoking Status Former   Current packs/day: 0.00   Average packs/day: 1 pack/day for 10.0 years (10.0 ttl pk-yrs)   Types: Cigars, Cigarettes   Start date: 06/21/1989   Quit date: 03/26/2013   Years since quitting: 10.5  Smokeless Tobacco Never  Tobacco Comments   Quit cigarettes in 1978, started cigars around 2004    Review of Systems  Constitutional: Negative.   HENT: Negative.    Eyes: Negative.   Respiratory: Negative.    Cardiovascular: Negative.   Gastrointestinal: Negative.   Endocrine: Negative.   Genitourinary: Negative.   Musculoskeletal: Negative.   Skin: Negative.   Neurological: Negative.   Hematological: Negative.   Psychiatric/Behavioral: Negative.        Today's Vitals   10/17/23 0835  BP: 112/70  Pulse: 62  Temp: 98.1 F (36.7 C)  TempSrc: Oral  Weight: 181 lb (82.1 kg)  Height: 5\' 9"  (1.753 m)  PainSc: 6   PainLoc: Shoulder   Body mass index is 26.73 kg/m.  Wt Readings from Last 3 Encounters:  10/17/23 181 lb (82.1 kg)  09/29/23 183 lb (83 kg)  09/14/23 179 lb 9.6 oz (81.5 kg)    Objective:  Physical Exam Vitals and nursing note reviewed.  Constitutional:      General: He is not in acute distress.    Appearance: Normal appearance.  HENT:     Head: Normocephalic and atraumatic.     Right Ear: Tympanic membrane, ear canal and external ear normal. There is no impacted cerumen.     Left Ear: Tympanic membrane, ear canal and external ear normal. There is no impacted cerumen.     Nose: Nose normal.     Mouth/Throat:     Mouth: Mucous membranes are moist.  Eyes:     Extraocular Movements: Extraocular movements intact.     Conjunctiva/sclera: Conjunctivae normal.     Pupils: Pupils are equal, round, and reactive to light.  Cardiovascular:     Rate and Rhythm: Normal rate and regular rhythm.     Pulses: Normal pulses.     Heart sounds: Normal heart sounds. No murmur heard. Pulmonary:     Effort: Pulmonary effort is normal. No respiratory distress.     Breath sounds: Normal breath sounds. No wheezing.  Abdominal:     General: Abdomen is flat. Bowel sounds are normal. There is no distension.     Palpations: Abdomen is soft.     Tenderness: There is no abdominal tenderness.  Genitourinary:    Comments: Deferred - has a referral to Urology Musculoskeletal:        General: Normal range of motion.     Cervical back: Normal range of motion and neck supple.  Skin:    General: Skin is warm.     Capillary Refill: Capillary refill takes less than 2 seconds.  Neurological:     General: No focal deficit present.     Mental Status: He is alert and oriented to person, place, and time.     Cranial Nerves: No cranial nerve  deficit.     Motor: No weakness.  Psychiatric:        Mood and Affect: Mood normal.        Behavior: Behavior normal.        Thought Content: Thought content normal.        Judgment: Judgment normal.      Assessment And Plan:  Encounter for annual physical exam Assessment & Plan: Behavior modifications discussed and diet history reviewed.   Pt will continue to exercise regularly and modify diet with low GI, plant based foods and decrease intake of processed foods.  Recommend intake of daily multivitamin, Vitamin D, and calcium .  Recommend colonoscopy for preventive screenings, as well as recommend immunizations that include influenza, TDAP, and Shingles    Mixed hyperlipidemia Assessment & Plan: Chronic, sable, continue current medications.  Will check lipid panel.  Orders: -     CMP14+EGFR  Atherosclerosis of aorta (HCC) Assessment & Plan: Continue statin, tolerating well   Orders: -     Lipid panel  Abnormal glucose Assessment & Plan: HgbA1c was slightly elevated at last visit. Diet controlled. Continue focusing on healthy diet low in sugar and starches.   Orders: -     Hemoglobin A1c  Iron deficiency anemia secondary to inadequate dietary iron intake Assessment & Plan: Ferritin levels have been elevated, will recheck today.  Orders: -     Iron, TIBC and Ferritin Panel  Other long term (current) drug therapy -     CBC  Encounter for prostate cancer screening -     PSA     Return for 1 year physical. Patient was given opportunity to ask questions. Patient verbalized understanding of the plan and was able to repeat key elements of the plan. All questions were answered to their satisfaction.   Robert Epley, FNP  I, Robert Epley, FNP, have reviewed all documentation for this visit. The documentation on 10/17/23 for the exam, diagnosis, procedures, and orders are all accurate and complete.

## 2023-10-17 NOTE — Assessment & Plan Note (Signed)
 Behavior modifications discussed and diet history reviewed.   Pt will continue to exercise regularly and modify diet with low GI, plant based foods and decrease intake of processed foods.  Recommend intake of daily multivitamin, Vitamin D, and calcium.  Recommend colonoscopy for preventive screenings, as well as recommend immunizations that include influenza, TDAP, and Shingles

## 2023-10-17 NOTE — Patient Instructions (Signed)
 Health Maintenance, Male  Adopting a healthy lifestyle and getting preventive care are important in promoting health and wellness. Ask your health care provider about:  The right schedule for you to have regular tests and exams.  Things you can do on your own to prevent diseases and keep yourself healthy.  What should I know about diet, weight, and exercise?  Eat a healthy diet    Eat a diet that includes plenty of vegetables, fruits, low-fat dairy products, and lean protein.  Do not eat a lot of foods that are high in solid fats, added sugars, or sodium.  Maintain a healthy weight  Body mass index (BMI) is a measurement that can be used to identify possible weight problems. It estimates body fat based on height and weight. Your health care provider can help determine your BMI and help you achieve or maintain a healthy weight.  Get regular exercise  Get regular exercise. This is one of the most important things you can do for your health. Most adults should:  Exercise for at least 150 minutes each week. The exercise should increase your heart rate and make you sweat (moderate-intensity exercise).  Do strengthening exercises at least twice a week. This is in addition to the moderate-intensity exercise.  Spend less time sitting. Even light physical activity can be beneficial.  Watch cholesterol and blood lipids  Have your blood tested for lipids and cholesterol at 70 years of age, then have this test every 5 years.  You may need to have your cholesterol levels checked more often if:  Your lipid or cholesterol levels are high.  You are older than 70 years of age.  You are at high risk for heart disease.  What should I know about cancer screening?  Many types of cancers can be detected early and may often be prevented. Depending on your health history and family history, you may need to have cancer screening at various ages. This may include screening for:  Colorectal cancer.  Prostate cancer.  Skin cancer.  Lung  cancer.  What should I know about heart disease, diabetes, and high blood pressure?  Blood pressure and heart disease  High blood pressure causes heart disease and increases the risk of stroke. This is more likely to develop in people who have high blood pressure readings or are overweight.  Talk with your health care provider about your target blood pressure readings.  Have your blood pressure checked:  Every 3-5 years if you are 9-95 years of age.  Every year if you are 85 years old or older.  If you are between the ages of 29 and 29 and are a current or former smoker, ask your health care provider if you should have a one-time screening for abdominal aortic aneurysm (AAA).  Diabetes  Have regular diabetes screenings. This checks your fasting blood sugar level. Have the screening done:  Once every three years after age 23 if you are at a normal weight and have a low risk for diabetes.  More often and at a younger age if you are overweight or have a high risk for diabetes.  What should I know about preventing infection?  Hepatitis B  If you have a higher risk for hepatitis B, you should be screened for this virus. Talk with your health care provider to find out if you are at risk for hepatitis B infection.  Hepatitis C  Blood testing is recommended for:  Everyone born from [birth year] through 1967.  Anyone  with known risk factors for hepatitis C.  Sexually transmitted infections (STIs)  You should be screened each year for STIs, including gonorrhea and chlamydia, if:  You are sexually active and are younger than 70 years of age.  You are older than 70 years of age and your health care provider tells you that you are at risk for this type of infection.  Your sexual activity has changed since you were last screened, and you are at increased risk for chlamydia or gonorrhea. Ask your health care provider if you are at risk.  Ask your health care provider about whether you are at high risk for HIV. Your health care provider  may recommend a prescription medicine to help prevent HIV infection. If you choose to take medicine to prevent HIV, you should first get tested for HIV. You should then be tested every 3 months for as long as you are taking the medicine.  Follow these instructions at home:  Alcohol use  Do not drink alcohol if your health care provider tells you not to drink.  If you drink alcohol:  Limit how much you have to 0-2 drinks a day.  Know how much alcohol is in your drink. In the [place] one drink equals one 12 oz bottle of beer (355 mL), one 5 oz glass of wine (148 mL), or one 1 oz glass of hard liquor (44 mL).  Lifestyle  Do not use any products that contain nicotine or tobacco. These products include cigarettes, chewing tobacco, and vaping devices, such as e-cigarettes. If you need help quitting, ask your health care provider.  Do not use street drugs.  Do not share needles.  Ask your health care provider for help if you need support or information about quitting drugs.  General instructions  Schedule regular health, dental, and eye exams.  Stay current with your vaccines.  Tell your health care provider if:  You often feel depressed.  You have ever been abused or do not feel safe at home.  Summary  Adopting a healthy lifestyle and getting preventive care are important in promoting health and wellness.  Follow your health care provider's instructions about healthy diet, exercising, and getting tested or screened for diseases.  Follow your health care provider's instructions on monitoring your cholesterol and blood pressure.  This information is not intended to replace advice given to you by your health care provider. Make sure you discuss any questions you have with your health care provider.  Document Revised: 10/27/2020 Document Reviewed: 10/27/2020  Elsevier Patient Education  2024 [employer].

## 2023-10-17 NOTE — Assessment & Plan Note (Signed)
 Continue statin, tolerating well

## 2023-10-17 NOTE — Assessment & Plan Note (Signed)
 Ferritin levels have been elevated, will recheck today.

## 2023-10-17 NOTE — Assessment & Plan Note (Signed)
 Chronic, sable, continue current medications.  Will check lipid panel.

## 2023-10-18 DIAGNOSIS — M67911 Unspecified disorder of synovium and tendon, right shoulder: Secondary | ICD-10-CM | POA: Diagnosis not present

## 2023-10-18 DIAGNOSIS — M170 Bilateral primary osteoarthritis of knee: Secondary | ICD-10-CM | POA: Diagnosis not present

## 2023-10-18 LAB — CBC
Hematocrit: 39.3 % (ref 37.5–51.0)
Hemoglobin: 13.4 g/dL (ref 13.0–17.7)
MCH: 31.8 pg (ref 26.6–33.0)
MCHC: 34.1 g/dL (ref 31.5–35.7)
MCV: 93 fL (ref 79–97)
Platelets: 200 10*3/uL (ref 150–450)
RBC: 4.22 x10E6/uL (ref 4.14–5.80)
RDW: 13.1 % (ref 11.6–15.4)
WBC: 5.5 10*3/uL (ref 3.4–10.8)

## 2023-10-18 LAB — LIPID PANEL
Chol/HDL Ratio: 2.6 ratio (ref 0.0–5.0)
Cholesterol, Total: 160 mg/dL (ref 100–199)
HDL: 61 mg/dL (ref >39)
LDL Chol Calc (NIH): 86 mg/dL (ref 0–99)
Triglycerides: 66 mg/dL (ref 0–149)
VLDL Cholesterol Cal: 13 mg/dL (ref 5–40)

## 2023-10-18 LAB — HEMOGLOBIN A1C
Est. average glucose Bld gHb Est-mCnc: 126 mg/dL
Hgb A1c MFr Bld: 6 % — ABNORMAL HIGH (ref 4.8–5.6)

## 2023-10-18 LAB — IRON,TIBC AND FERRITIN PANEL
Ferritin: 510 ng/mL — ABNORMAL HIGH (ref 30–400)
Iron Saturation: 26 % (ref 15–55)
Iron: 69 ug/dL (ref 38–169)
Total Iron Binding Capacity: 267 ug/dL (ref 250–450)
UIBC: 198 ug/dL (ref 111–343)

## 2023-10-18 LAB — CMP14+EGFR
ALT: 30 IU/L (ref 0–44)
AST: 26 IU/L (ref 0–40)
Albumin: 4.1 g/dL (ref 3.9–4.9)
Alkaline Phosphatase: 57 IU/L (ref 44–121)
BUN/Creatinine Ratio: 11 (ref 10–24)
BUN: 11 mg/dL (ref 8–27)
Bilirubin Total: 0.4 mg/dL (ref 0.0–1.2)
CO2: 21 mmol/L (ref 20–29)
Calcium: 8.7 mg/dL (ref 8.6–10.2)
Chloride: 107 mmol/L — ABNORMAL HIGH (ref 96–106)
Creatinine, Ser: 1 mg/dL (ref 0.76–1.27)
Globulin, Total: 2 g/dL (ref 1.5–4.5)
Glucose: 101 mg/dL — ABNORMAL HIGH (ref 70–99)
Potassium: 4.1 mmol/L (ref 3.5–5.2)
Sodium: 142 mmol/L (ref 134–144)
Total Protein: 6.1 g/dL (ref 6.0–8.5)
eGFR: 81 mL/min/{1.73_m2} (ref >59)

## 2023-10-18 LAB — PSA: Prostate Specific Ag, Serum: 0.7 ng/mL (ref 0.0–4.0)

## 2023-10-19 ENCOUNTER — Encounter: Payer: Self-pay | Admitting: Nurse Practitioner

## 2023-11-07 DIAGNOSIS — M1712 Unilateral primary osteoarthritis, left knee: Secondary | ICD-10-CM | POA: Diagnosis not present

## 2023-11-07 DIAGNOSIS — R3912 Poor urinary stream: Secondary | ICD-10-CM | POA: Diagnosis not present

## 2023-11-07 DIAGNOSIS — M170 Bilateral primary osteoarthritis of knee: Secondary | ICD-10-CM | POA: Diagnosis not present

## 2023-11-07 DIAGNOSIS — M25511 Pain in right shoulder: Secondary | ICD-10-CM | POA: Diagnosis not present

## 2023-11-07 DIAGNOSIS — M1711 Unilateral primary osteoarthritis, right knee: Secondary | ICD-10-CM | POA: Diagnosis not present

## 2023-11-15 DIAGNOSIS — M25511 Pain in right shoulder: Secondary | ICD-10-CM | POA: Diagnosis not present

## 2023-11-22 DIAGNOSIS — L500 Allergic urticaria: Secondary | ICD-10-CM | POA: Diagnosis not present

## 2023-11-28 DIAGNOSIS — M19011 Primary osteoarthritis, right shoulder: Secondary | ICD-10-CM | POA: Diagnosis not present

## 2023-11-28 DIAGNOSIS — S46111D Strain of muscle, fascia and tendon of long head of biceps, right arm, subsequent encounter: Secondary | ICD-10-CM | POA: Diagnosis not present

## 2023-11-30 DIAGNOSIS — H52223 Regular astigmatism, bilateral: Secondary | ICD-10-CM | POA: Diagnosis not present

## 2023-11-30 DIAGNOSIS — H35033 Hypertensive retinopathy, bilateral: Secondary | ICD-10-CM | POA: Diagnosis not present

## 2023-11-30 DIAGNOSIS — H524 Presbyopia: Secondary | ICD-10-CM | POA: Diagnosis not present

## 2023-11-30 DIAGNOSIS — H353112 Nonexudative age-related macular degeneration, right eye, intermediate dry stage: Secondary | ICD-10-CM | POA: Diagnosis not present

## 2023-11-30 DIAGNOSIS — H5203 Hypermetropia, bilateral: Secondary | ICD-10-CM | POA: Diagnosis not present

## 2023-11-30 DIAGNOSIS — H2513 Age-related nuclear cataract, bilateral: Secondary | ICD-10-CM | POA: Diagnosis not present

## 2023-11-30 DIAGNOSIS — H40023 Open angle with borderline findings, high risk, bilateral: Secondary | ICD-10-CM | POA: Diagnosis not present

## 2023-12-02 DIAGNOSIS — M19011 Primary osteoarthritis, right shoulder: Secondary | ICD-10-CM | POA: Diagnosis not present

## 2023-12-02 DIAGNOSIS — S46111D Strain of muscle, fascia and tendon of long head of biceps, right arm, subsequent encounter: Secondary | ICD-10-CM | POA: Diagnosis not present

## 2023-12-06 DIAGNOSIS — M19011 Primary osteoarthritis, right shoulder: Secondary | ICD-10-CM | POA: Diagnosis not present

## 2023-12-06 DIAGNOSIS — S46111D Strain of muscle, fascia and tendon of long head of biceps, right arm, subsequent encounter: Secondary | ICD-10-CM | POA: Diagnosis not present

## 2023-12-09 DIAGNOSIS — S46111D Strain of muscle, fascia and tendon of long head of biceps, right arm, subsequent encounter: Secondary | ICD-10-CM | POA: Diagnosis not present

## 2023-12-09 DIAGNOSIS — M7712 Lateral epicondylitis, left elbow: Secondary | ICD-10-CM | POA: Diagnosis not present

## 2023-12-09 DIAGNOSIS — M19011 Primary osteoarthritis, right shoulder: Secondary | ICD-10-CM | POA: Diagnosis not present

## 2023-12-12 DIAGNOSIS — M19011 Primary osteoarthritis, right shoulder: Secondary | ICD-10-CM | POA: Diagnosis not present

## 2023-12-12 DIAGNOSIS — S46111D Strain of muscle, fascia and tendon of long head of biceps, right arm, subsequent encounter: Secondary | ICD-10-CM | POA: Diagnosis not present

## 2023-12-14 DIAGNOSIS — H9319 Tinnitus, unspecified ear: Secondary | ICD-10-CM | POA: Diagnosis not present

## 2023-12-14 DIAGNOSIS — H903 Sensorineural hearing loss, bilateral: Secondary | ICD-10-CM | POA: Diagnosis not present

## 2023-12-14 DIAGNOSIS — H93233 Hyperacusis, bilateral: Secondary | ICD-10-CM | POA: Diagnosis not present

## 2023-12-14 DIAGNOSIS — S46111D Strain of muscle, fascia and tendon of long head of biceps, right arm, subsequent encounter: Secondary | ICD-10-CM | POA: Diagnosis not present

## 2023-12-14 DIAGNOSIS — M19011 Primary osteoarthritis, right shoulder: Secondary | ICD-10-CM | POA: Diagnosis not present

## 2023-12-26 DIAGNOSIS — S46111D Strain of muscle, fascia and tendon of long head of biceps, right arm, subsequent encounter: Secondary | ICD-10-CM | POA: Diagnosis not present

## 2023-12-26 DIAGNOSIS — M19011 Primary osteoarthritis, right shoulder: Secondary | ICD-10-CM | POA: Diagnosis not present

## 2023-12-27 DIAGNOSIS — G5602 Carpal tunnel syndrome, left upper limb: Secondary | ICD-10-CM | POA: Diagnosis not present

## 2024-01-09 DIAGNOSIS — R200 Anesthesia of skin: Secondary | ICD-10-CM | POA: Diagnosis not present

## 2024-01-11 DIAGNOSIS — L439 Lichen planus, unspecified: Secondary | ICD-10-CM | POA: Diagnosis not present

## 2024-01-11 DIAGNOSIS — L281 Prurigo nodularis: Secondary | ICD-10-CM | POA: Diagnosis not present

## 2024-01-12 ENCOUNTER — Other Ambulatory Visit: Payer: Self-pay

## 2024-01-12 ENCOUNTER — Encounter: Payer: Self-pay | Admitting: Nurse Practitioner

## 2024-01-12 MED ORDER — COLCHICINE 0.6 MG PO TABS: 0.6000 mg | ORAL_TABLET | Freq: Every day | ORAL | 1 refills | Status: DC | PRN

## 2024-01-18 DIAGNOSIS — G5602 Carpal tunnel syndrome, left upper limb: Secondary | ICD-10-CM | POA: Diagnosis not present

## 2024-01-18 DIAGNOSIS — G5622 Lesion of ulnar nerve, left upper limb: Secondary | ICD-10-CM | POA: Diagnosis not present

## 2024-01-23 DIAGNOSIS — M1712 Unilateral primary osteoarthritis, left knee: Secondary | ICD-10-CM | POA: Diagnosis not present

## 2024-01-24 DIAGNOSIS — M1711 Unilateral primary osteoarthritis, right knee: Secondary | ICD-10-CM | POA: Diagnosis not present

## 2024-01-25 ENCOUNTER — Encounter (HOSPITAL_BASED_OUTPATIENT_CLINIC_OR_DEPARTMENT_OTHER): Payer: Self-pay

## 2024-01-25 ENCOUNTER — Emergency Department (HOSPITAL_BASED_OUTPATIENT_CLINIC_OR_DEPARTMENT_OTHER): Admitting: Radiology

## 2024-01-25 ENCOUNTER — Other Ambulatory Visit: Payer: Self-pay

## 2024-01-25 ENCOUNTER — Emergency Department (HOSPITAL_BASED_OUTPATIENT_CLINIC_OR_DEPARTMENT_OTHER)
Admission: EM | Admit: 2024-01-25 | Discharge: 2024-01-25 | Disposition: A | Discharge status: Home / Self Care | Attending: Emergency Medicine | Admitting: Emergency Medicine

## 2024-01-25 DIAGNOSIS — M79671 Pain in right foot: Secondary | ICD-10-CM | POA: Diagnosis not present

## 2024-01-25 DIAGNOSIS — M7731 Calcaneal spur, right foot: Secondary | ICD-10-CM | POA: Diagnosis not present

## 2024-01-25 DIAGNOSIS — M19071 Primary osteoarthritis, right ankle and foot: Secondary | ICD-10-CM | POA: Diagnosis not present

## 2024-01-25 DIAGNOSIS — M109 Gout, unspecified: Secondary | ICD-10-CM | POA: Insufficient documentation

## 2024-01-25 MED ORDER — COLCHICINE 0.6 MG PO TABS: 0.6000 mg | ORAL_TABLET | Freq: Every day | ORAL | 1 refills | Status: AC | PRN

## 2024-01-25 MED ORDER — KETOROLAC TROMETHAMINE 60 MG/2ML IM SOLN
60.0000 mg | Freq: Once | INTRAMUSCULAR | Status: AC
  Administered 2024-01-25: 60 mg via INTRAMUSCULAR
  Filled 2024-01-25: qty 2

## 2024-01-25 NOTE — ED Provider Notes (Signed)
 [place] EMERGENCY DEPARTMENT AT [facility] Provider Note   CSN: [id number] Arrival date & time: 01/25/24  1022     Patient presents with: Foot Pain   Robert Donaldson is a 68 y.o. male.   HPI     68yo male with history of hyperlipemia, gout presents with concern for right foot pain.   2 days ago, Tuesday Did some work in the yard, no trauma, no twisting of foot, a little twinge of pain Monday night Then yesterday Tuesday AM was severe pain, worse as the day progressed, didn't do much. Went to bed early ,took 2 ibuprofen , got ice pack, wrapped it and felt a little better.  Did drink wine this week.  NO fevers Hx of gout but usually affects big toe, but last week had it in right foot in toe  Woke up this AM and right hand stiff 10/10 pain can't put weight on it Can curl toes flex foot but weight bearing is very painful. Has crutch at home.    Past Medical History:  Diagnosis Date   Arthritis 2023?   Knees and shoulders   BPH (benign prostatic hyperplasia)    Encounter for annual physical exam 10/17/2023   Gastroesophageal reflux disease 12/09/2022   HLD (hyperlipidemia)    Pancreatitis    Vitamin D deficiency      Prior to Admission medications   Medication Sig Start Date End Date Taking? Authorizing Provider  acyclovir  ointment (ZOVIRAX ) 5 % APPLY 1 APPLICATION TOPICALLY EVERY 3 HOURS 06/25/22   Georgina Speaks, FNP  celecoxib (CELEBREX) 200 MG capsule Take 200 mg by mouth daily. Takes as needed 09/14/19   [provider]  Cholecalciferol 125 MCG (5000 UT) TABS Take 125 mcg by mouth daily. Taking 1 tablet by mouth daily.    [provider]  colchicine  0.6 MG tablet Take 1 tablet (0.6 mg total) by mouth daily as needed (gout). 01/25/24   Dreama Longs, MD  cyclobenzaprine  (FLEXERIL ) 10 MG tablet Take 1 tablet (10 mg total) by mouth 3 (three) times daily as needed for muscle spasms. 08/13/21   Georgina Speaks, FNP  diclofenac  Sodium (VOLTAREN ) 1 %  GEL Apply 2 g topically 4 (four) times daily. 08/13/21   Georgina Speaks, FNP  ferrous sulfate  325 (65 FE) MG EC tablet Take 1 tablet (325 mg total) by mouth 2 (two) times daily. 01/22/22 01/22/23  Georgina Speaks, FNP  hydroxychloroquine  (PLAQUENIL ) 200 MG tablet Take 1 tablet (200 mg total) by mouth 2 (two) times daily. 04/12/22   Georgina Speaks, FNP  Multiple Vitamins-Minerals (ADULT GUMMY PO) Take 1 each by mouth in the morning, at noon, and at bedtime. FIBER    [provider]  OPZELURA 1.5 % CREA  09/01/22   [provider]  pantoprazole  (PROTONIX ) 20 MG tablet Take 1 tablet (20 mg total) by mouth daily. 07/19/23   Georgina Speaks, FNP  rosuvastatin  (CRESTOR ) 20 MG tablet Take 1 tablet (20 mg total) by mouth daily. 07/06/23   Georgina Speaks, FNP  tamsulosin (FLOMAX) 0.4 MG CAPS capsule Take 0.4 mg by mouth at bedtime. 04/02/22   [provider]  vitamin E 200 UNIT capsule Take 5,000 Units by mouth daily.     [provider]    Allergies: Patient has no known allergies.    Review of Systems  Updated Vital Signs BP 116/61   Pulse (!) 49   Temp 97.7 F (36.5 C) (Oral)   Resp 18   SpO2 99%  Physical Exam Vitals and nursing note reviewed.  Constitutional:      General: He is not in acute distress.    Appearance: He is well-developed. He is not diaphoretic.  HENT:     Head: Normocephalic and atraumatic.  Eyes:     Conjunctiva/sclera: Conjunctivae normal.  Cardiovascular:     Rate and Rhythm: Normal rate and regular rhythm.     Heart sounds: Normal heart sounds. No murmur heard.    No friction rub. No gallop.  Pulmonary:     Effort: Pulmonary effort is normal. No respiratory distress.     Breath sounds: Normal breath sounds. No wheezing or rales.  Abdominal:     General: There is no distension.     Palpations: Abdomen is soft.     Tenderness: There is no abdominal tenderness. There is no guarding.  Musculoskeletal:        General: Tenderness (fifth MTP,  swelling above 5th MTP) present.     Cervical back: Normal range of motion.  Skin:    General: Skin is warm and dry.  Neurological:     Mental Status: He is alert and oriented to person, place, and time.     (all labs ordered are listed, but only abnormal results are displayed) Labs Reviewed - No data to display  EKG: None  Radiology: DG Foot Complete Right Result Date: 01/25/2024 CLINICAL DATA:  Pain for 2 days.  No known injury. EXAM: RIGHT FOOT COMPLETE - 3+ VIEW COMPARISON:  05/16/2022. FINDINGS: No acute fracture or dislocation. No aggressive osseous lesion. 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 No periarticular osteopenia or bony erosions. No periarticular soft tissue calcifications. Calcaneal spur noted along the Plantar aponeurosis attachment site. No focal soft tissue swelling.  Vascular calcifications noted. No radiopaque foreign bodies. IMPRESSION: No acute osseous abnormality of the right foot. Electronically Signed   By: Ree Molt M.D.   On: 01/25/2024 11:02     Procedures   Medications Ordered in the ED  ketorolac  (TORADOL ) injection 60 mg (60 mg Intramuscular Given 01/25/24 1413)                                     68yo male with history of hyperlipemia, gout presents with concern for right foot pain.   Normal bilateral pulses, no sign of acute arterial thrombus. No leg pain of swelling, low clinical suspicion for DVT.  No fever, no erythema, no sign of abscess, cellulitis, low suspicion for osteomyelitis, septic arthritis.   XR obtained and personally evaluated and interpreted by me shows no evidence of fracture or dislocation.   Given hx of gout, severity of pain, suspect gout of base of 5th metatarsal.  Discussed options for pain control, will do colchicine , home NSAIDs, close follow up and return precautions. Patient discharged in stable condition with understanding of reasons to return.       Final diagnoses:  Right foot pain   Acute gout of right foot, unspecified cause    ED Discharge Orders          Ordered    colchicine  0.6 MG tablet  Daily PRN        01/25/24 1404               Dreama Longs, MD 01/25/24 2138

## 2024-01-25 NOTE — ED Triage Notes (Signed)
 Patient reports right foot pain for about two days. He states that he has had no injury to it. He reports a history of gout but states this pain is different and his gout usually effects his large toe. He doesn't report any swelling, redness, or fevers.

## 2024-01-25 NOTE — Discharge Instructions (Addendum)
 Take 2 tablets of colchicine  (total 1.2mg ) then one hour later take another tablet of colchicine  .6mg .  Then continue one tablet twice daily.

## 2024-01-26 DIAGNOSIS — M25522 Pain in left elbow: Secondary | ICD-10-CM | POA: Diagnosis not present

## 2024-01-26 DIAGNOSIS — G5602 Carpal tunnel syndrome, left upper limb: Secondary | ICD-10-CM | POA: Diagnosis not present

## 2024-01-30 DIAGNOSIS — M1712 Unilateral primary osteoarthritis, left knee: Secondary | ICD-10-CM | POA: Diagnosis not present

## 2024-01-31 DIAGNOSIS — M1711 Unilateral primary osteoarthritis, right knee: Secondary | ICD-10-CM | POA: Diagnosis not present

## 2024-02-02 DIAGNOSIS — G5602 Carpal tunnel syndrome, left upper limb: Secondary | ICD-10-CM | POA: Diagnosis not present

## 2024-02-06 DIAGNOSIS — M1712 Unilateral primary osteoarthritis, left knee: Secondary | ICD-10-CM | POA: Diagnosis not present

## 2024-02-07 DIAGNOSIS — M1711 Unilateral primary osteoarthritis, right knee: Secondary | ICD-10-CM | POA: Diagnosis not present

## 2024-02-09 DIAGNOSIS — G5602 Carpal tunnel syndrome, left upper limb: Secondary | ICD-10-CM | POA: Diagnosis not present

## 2024-03-01 DIAGNOSIS — G5602 Carpal tunnel syndrome, left upper limb: Secondary | ICD-10-CM | POA: Diagnosis not present

## 2024-03-26 ENCOUNTER — Encounter: Payer: Self-pay | Admitting: Nurse Practitioner

## 2024-04-09 DIAGNOSIS — M79672 Pain in left foot: Secondary | ICD-10-CM | POA: Diagnosis not present

## 2024-04-17 ENCOUNTER — Encounter: Payer: Self-pay | Admitting: Nurse Practitioner

## 2024-04-17 ENCOUNTER — Ambulatory Visit (INDEPENDENT_AMBULATORY_CARE_PROVIDER_SITE_OTHER): Payer: Self-pay | Admitting: Nurse Practitioner

## 2024-04-17 VITALS — BP 130/78 | HR 60 | Temp 98.5°F | Ht 69.0 in | Wt 177.0 lb

## 2024-04-17 DIAGNOSIS — Z2821 Immunization not carried out because of patient refusal: Secondary | ICD-10-CM

## 2024-04-17 DIAGNOSIS — Z79899 Other long term (current) drug therapy: Secondary | ICD-10-CM

## 2024-04-17 DIAGNOSIS — R7309 Other abnormal glucose: Secondary | ICD-10-CM

## 2024-04-17 DIAGNOSIS — E663 Overweight: Secondary | ICD-10-CM

## 2024-04-17 DIAGNOSIS — Z6826 Body mass index (BMI) 26.0-26.9, adult: Secondary | ICD-10-CM | POA: Diagnosis not present

## 2024-04-17 DIAGNOSIS — Z8739 Personal history of other diseases of the musculoskeletal system and connective tissue: Secondary | ICD-10-CM | POA: Diagnosis not present

## 2024-04-17 DIAGNOSIS — I700 Atherosclerosis of aorta: Secondary | ICD-10-CM

## 2024-04-17 DIAGNOSIS — E782 Mixed hyperlipidemia: Secondary | ICD-10-CM | POA: Diagnosis not present

## 2024-04-17 MED ORDER — ROSUVASTATIN CALCIUM 20 MG PO TABS: 20.0000 mg | ORAL_TABLET | Freq: Every day | ORAL | 2 refills | Status: DC

## 2024-04-17 NOTE — Progress Notes (Signed)
 LILLETTE Kristeen JINNY Gladis, CMA,acting as a neurosurgeon for Robert Ada, FNP.,have documented all relevant documentation on the behalf of Robert Ada, FNP,as directed by  Robert Ada, FNP while in the presence of Robert Ada, FNP.  Subjective:  Patient ID: Robert Donaldson , male    DOB: [birth year] , 68 y.o.   MRN: [id number]  Chief Complaint  Patient presents with  . Hyperlipidemia    Patient presents today for a CHOL and PRE dm follow up, Patient reports compliance with medication. Patient denies any chest pain, SOB, or headaches. Patient has no concerns today.      HPI   Past Medical History:  Diagnosis Date  . Arthritis 2023?   Knees and shoulders  . BPH (benign prostatic hyperplasia)   . Encounter for annual physical exam 10/17/2023  . Gastroesophageal reflux disease 12/09/2022  . HLD (hyperlipidemia)   . Pancreatitis   . Vitamin D deficiency      Family History  Problem Relation Age of Onset  . Kidney disease Mother   . Hyperlipidemia Sister   . Diabetes Paternal Grandmother      Current Outpatient Medications:  .  acyclovir  ointment (ZOVIRAX ) 5 %, APPLY 1 APPLICATION TOPICALLY EVERY 3 HOURS, Disp: 30 g, Rfl: 11 .  colchicine  0.6 MG tablet, Take 1 tablet (0.6 mg total) by mouth daily as needed (gout)., Disp: 20 tablet, Rfl: 1 .  cyclobenzaprine  (FLEXERIL ) 10 MG tablet, Take 1 tablet (10 mg total) by mouth 3 (three) times daily as needed for muscle spasms., Disp: 30 tablet, Rfl: 0 .  diclofenac  Sodium (VOLTAREN ) 1 % GEL, Apply 2 g topically 4 (four) times daily., Disp: 100 g, Rfl: 2 .  hydroxychloroquine  (PLAQUENIL ) 200 MG tablet, Take 1 tablet (200 mg total) by mouth 2 (two) times daily., Disp: 60 tablet, Rfl: 0 .  Multiple Vitamins-Minerals (ADULT GUMMY PO), Take 1 each by mouth in the morning, at noon, and at bedtime. FIBER, Disp: , Rfl:  .  OPZELURA 1.5 % CREA, , Disp: , Rfl:  .  pantoprazole  (PROTONIX ) 20 MG tablet, Take 1 tablet (20 mg total) by mouth daily., Disp: 30 tablet,  Rfl: 2 .  tamsulosin (FLOMAX) 0.4 MG CAPS capsule, Take 0.4 mg by mouth at bedtime., Disp: , Rfl:  .  vitamin E 200 UNIT capsule, Take 5,000 Units by mouth daily. , Disp: , Rfl:  .  rosuvastatin  (CRESTOR ) 20 MG tablet, Take 1 tablet (20 mg total) by mouth daily., Disp: 90 tablet, Rfl: 2   No Known Allergies   Review of Systems  Constitutional: Negative.   Respiratory: Negative.    Cardiovascular: Negative.  Negative for chest pain, palpitations and leg swelling.  Gastrointestinal: Negative.   Neurological: Negative.   Psychiatric/Behavioral: Negative.       Today's Vitals   04/17/24 1056  BP: 130/78  Pulse: 60  Temp: 98.5 F (36.9 C)  TempSrc: Oral  Weight: 177 lb (80.3 kg)  Height: 5' 9 (1.753 m)  PainSc: 0-No pain   Body mass index is 26.14 kg/m.  Wt Readings from Last 3 Encounters:  04/17/24 177 lb (80.3 kg)  10/17/23 181 lb (82.1 kg)  09/29/23 183 lb (83 kg)    The 10-year ASCVD risk score (Arnett DK, et al., 2019) is: 10.7%   Values used to calculate the score:     Age: 10 years     Clincally relevant sex: Male     Is Non-Hispanic African American: Yes     Diabetic: No  Tobacco smoker: No     Systolic Blood Pressure: 130 mmHg     Is BP treated: No     HDL Cholesterol: 61 mg/dL     Total Cholesterol: 160 mg/dL  Objective:  Physical Exam Vitals and nursing note reviewed.  Constitutional:      General: He is not in acute distress.    Appearance: Normal appearance.  Cardiovascular:     Rate and Rhythm: Normal rate and regular rhythm.     Pulses: Normal pulses.     Heart sounds: Normal heart sounds. No murmur heard. Pulmonary:     Effort: Pulmonary effort is normal. No respiratory distress.     Breath sounds: Normal breath sounds. No wheezing.  Skin:    General: Skin is warm and dry.     Capillary Refill: Capillary refill takes less than 2 seconds.  Neurological:     General: No focal deficit present.     Mental Status: He is alert and oriented to  person, place, and time.     Cranial Nerves: No cranial nerve deficit.     Motor: No weakness.  Psychiatric:        Mood and Affect: Mood normal.        Behavior: Behavior normal.        Thought Content: Thought content normal.        Judgment: Judgment normal.     Assessment And Plan:  Mixed hyperlipidemia -     Lipid panel -     Rosuvastatin  Calcium ; Take 1 tablet (20 mg total) by mouth daily.  Dispense: 90 tablet; Refill: 2  Abnormal glucose -     Hemoglobin A1c  Atherosclerosis of aorta  Influenza vaccination declined  Overweight with body mass index (BMI) of 26 to 26.9 in adult  Other long term (current) drug therapy -     CMP14+EGFR    Return for KEEP SAME NEXT.  Patient was given opportunity to ask questions. Patient verbalized understanding of the plan and was able to repeat key elements of the plan. All questions were answered to their satisfaction.    LILLETTE Robert Ada, FNP, have reviewed all documentation for this visit. The documentation on 04/17/24 for the exam, diagnosis, procedures, and orders are all accurate and complete.   IF YOU HAVE BEEN REFERRED TO A SPECIALIST, IT MAY TAKE 1-2 WEEKS TO SCHEDULE/PROCESS THE REFERRAL. IF YOU HAVE NOT HEARD FROM US /SPECIALIST IN TWO WEEKS, PLEASE GIVE US  A CALL AT [phone].

## 2024-04-18 LAB — LIPID PANEL
Chol/HDL Ratio: 2.5 ratio (ref 0.0–5.0)
Cholesterol, Total: 165 mg/dL (ref 100–199)
HDL: 66 mg/dL (ref >39)
LDL Chol Calc (NIH): 86 mg/dL (ref 0–99)
Triglycerides: 70 mg/dL (ref 0–149)
VLDL Cholesterol Cal: 13 mg/dL (ref 5–40)

## 2024-04-18 LAB — CMP14+EGFR
ALT: 33 IU/L (ref 0–44)
AST: 32 IU/L (ref 0–40)
Albumin: 4.5 g/dL (ref 3.9–4.9)
Alkaline Phosphatase: 53 IU/L (ref 47–123)
BUN/Creatinine Ratio: 10 (ref 10–24)
BUN: 11 mg/dL (ref 8–27)
Bilirubin Total: 0.7 mg/dL (ref 0.0–1.2)
CO2: 20 mmol/L (ref 20–29)
Calcium: 9.7 mg/dL (ref 8.6–10.2)
Chloride: 106 mmol/L (ref 96–106)
Creatinine, Ser: 1.11 mg/dL (ref 0.76–1.27)
Globulin, Total: 2.4 g/dL (ref 1.5–4.5)
Glucose: 90 mg/dL (ref 70–99)
Potassium: 4.5 mmol/L (ref 3.5–5.2)
Sodium: 141 mmol/L (ref 134–144)
Total Protein: 6.9 g/dL (ref 6.0–8.5)
eGFR: 71 mL/min/1.73 (ref >59)

## 2024-04-18 LAB — HEMOGLOBIN A1C
Est. average glucose Bld gHb Est-mCnc: 120 mg/dL
Hgb A1c MFr Bld: 5.8 % — ABNORMAL HIGH (ref 4.8–5.6)

## 2024-04-20 ENCOUNTER — Ambulatory Visit: Payer: Self-pay | Admitting: Nurse Practitioner

## 2024-04-20 DIAGNOSIS — Z8739 Personal history of other diseases of the musculoskeletal system and connective tissue: Secondary | ICD-10-CM | POA: Insufficient documentation

## 2024-04-20 NOTE — Assessment & Plan Note (Signed)
 Painful gout affecting big toe. Advised to keep medication available for acute episodes. - Keep gout medication on hand for acute episodes. - Monitor for any signs of gout flare-ups.

## 2024-04-20 NOTE — Assessment & Plan Note (Signed)
 Elevated glucose due to high natural sugar intake. Advised moderation of high-sugar fruits. - Limit intake of high-sugar fruits like watermelon, grapes, and bananas. - Incorporate more berries into the diet for his lower sugar content.

## 2024-04-20 NOTE — Assessment & Plan Note (Signed)
 Continue statin, tolerating well

## 2024-04-20 NOTE — Assessment & Plan Note (Signed)
 Dietary management with focus on fish and other lean proteins. - Continue to limit red meat consumption. - Focus on a diet rich in fish and other lean proteins.

## 2024-05-01 DIAGNOSIS — E785 Hyperlipidemia, unspecified: Secondary | ICD-10-CM | POA: Diagnosis not present

## 2024-05-01 DIAGNOSIS — I700 Atherosclerosis of aorta: Secondary | ICD-10-CM | POA: Diagnosis not present

## 2024-05-01 DIAGNOSIS — N400 Enlarged prostate without lower urinary tract symptoms: Secondary | ICD-10-CM | POA: Diagnosis not present

## 2024-05-01 DIAGNOSIS — Z8249 Family history of ischemic heart disease and other diseases of the circulatory system: Secondary | ICD-10-CM | POA: Diagnosis not present

## 2024-05-01 DIAGNOSIS — N3943 Post-void dribbling: Secondary | ICD-10-CM | POA: Diagnosis not present

## 2024-05-01 DIAGNOSIS — M519 Unspecified thoracic, thoracolumbar and lumbosacral intervertebral disc disorder: Secondary | ICD-10-CM | POA: Diagnosis not present

## 2024-05-01 DIAGNOSIS — M109 Gout, unspecified: Secondary | ICD-10-CM | POA: Diagnosis not present

## 2024-05-01 DIAGNOSIS — H269 Unspecified cataract: Secondary | ICD-10-CM | POA: Diagnosis not present

## 2024-05-01 DIAGNOSIS — M1990 Unspecified osteoarthritis, unspecified site: Secondary | ICD-10-CM | POA: Diagnosis not present

## 2024-05-06 ENCOUNTER — Other Ambulatory Visit: Payer: Self-pay | Admitting: Nurse Practitioner

## 2024-05-06 DIAGNOSIS — E782 Mixed hyperlipidemia: Secondary | ICD-10-CM

## 2024-05-07 DIAGNOSIS — N5201 Erectile dysfunction due to arterial insufficiency: Secondary | ICD-10-CM | POA: Diagnosis not present

## 2024-05-07 DIAGNOSIS — N3943 Post-void dribbling: Secondary | ICD-10-CM | POA: Diagnosis not present

## 2024-05-07 DIAGNOSIS — N401 Enlarged prostate with lower urinary tract symptoms: Secondary | ICD-10-CM | POA: Diagnosis not present

## 2024-05-09 DIAGNOSIS — G5602 Carpal tunnel syndrome, left upper limb: Secondary | ICD-10-CM | POA: Diagnosis not present

## 2024-05-09 DIAGNOSIS — G8918 Other acute postprocedural pain: Secondary | ICD-10-CM | POA: Diagnosis not present

## 2024-05-09 DIAGNOSIS — G5622 Lesion of ulnar nerve, left upper limb: Secondary | ICD-10-CM | POA: Diagnosis not present

## 2024-07-20 ENCOUNTER — Encounter: Payer: Self-pay | Admitting: Internal Medicine

## 2024-07-20 ENCOUNTER — Ambulatory Visit: Admitting: Internal Medicine

## 2024-07-20 ENCOUNTER — Ambulatory Visit: Payer: Self-pay

## 2024-07-20 VITALS — BP 106/65 | HR 96 | Temp 98.0°F | Ht 69.0 in | Wt 182.0 lb

## 2024-07-20 DIAGNOSIS — M109 Gout, unspecified: Secondary | ICD-10-CM

## 2024-07-20 DIAGNOSIS — E785 Hyperlipidemia, unspecified: Secondary | ICD-10-CM | POA: Diagnosis not present

## 2024-07-20 DIAGNOSIS — Z8739 Personal history of other diseases of the musculoskeletal system and connective tissue: Secondary | ICD-10-CM

## 2024-07-20 MED ORDER — ALLOPURINOL 300 MG PO TABS: 300.0000 mg | ORAL_TABLET | Freq: Every day | ORAL | 2 refills | Status: AC

## 2024-07-20 MED ORDER — HYDROCODONE-ACETAMINOPHEN 5-325 MG PO TABS: 1.0000 | ORAL_TABLET | Freq: Four times a day (QID) | ORAL | 0 refills | Status: AC | PRN

## 2024-07-20 MED ORDER — HYDROCODONE-ACETAMINOPHEN 5-325 MG PO TABS: 1.0000 | ORAL_TABLET | Freq: Four times a day (QID) | ORAL | 0 refills | Status: DC | PRN

## 2024-07-20 MED ORDER — METHYLPREDNISOLONE 4 MG PO TABS: ORAL_TABLET | ORAL | 0 refills | Status: DC

## 2024-07-20 NOTE — Telephone Encounter (Signed)
 FYI Only or Action Required?: FYI only for provider: appointment scheduled on 1/30.  Patient was last seen in primary care on 04/17/2024 by Georgina Speaks, FNP.  Called Nurse Triage reporting Foot Swelling.  Symptoms began several days ago.  Interventions attempted: Prescription medications: colchicine .  Symptoms are: gradually worsening.  Triage Disposition: See PCP When Office is Open (Within 3 Days)  Patient/caregiver understands and will follow disposition?: Yes  Pt is experiencing a lot of pain and can hardly walk.   colchicine  0.6 MG tablet- I submitted a refill request  Reason for Disposition  [1] MODERATE pain (e.g., interferes with normal activities, limping) AND [2] present > 3 days  Protocols used: Foot Pain-A-AH

## 2024-07-20 NOTE — Progress Notes (Signed)
 "   Patient Care Team: Georgina Speaks, FNP as PCP - General (General Practice) Livingston Rigg, MD as Consulting Physician (Dermatology) Pearson, Vallie J, [facility] (Inactive) (Pharmacist)  Visit Date: 07/20/24  Subjective:    Patient ID: Robert Donaldson , Male   DOB: 10/18/1955, 69 y.o.    MRN: [id number]   70 y.o. Male presents today for Foot pain. Patient has a past medical history of Hyperlipidemia, gout.  History of gout, advised to keep colchicine  0.6 on hand for flair ups. He has had foot pain for several days. He took colchicine  for several days and the pain was not relieved. He said the past couple of days he has eaten venison and red wine.  His fifth toe on his left foot is erythematous and hot.    History of hyperlipidemia treated with Rosuvastatin  20 mg daily.   Was seen in the ED on 01/25/2024 for right foot pain. There was tenderness and swelling above the 5th MTP.  XR obtained shows no evidence of fracture or dislocation. He was given Toradol  60 mg IV in the hospital and prescribed colchicine  0.6 mg daily on discharge.     Past Medical History:  Diagnosis Date   Arthritis 2023?   Knees and shoulders   BPH (benign prostatic hyperplasia)    Encounter for annual physical exam 10/17/2023   Gastroesophageal reflux disease 12/09/2022   HLD (hyperlipidemia)    Pancreatitis    Vitamin D deficiency      Family History  Problem Relation Age of Onset   Kidney disease Mother    Hyperlipidemia Sister    Diabetes Paternal Grandmother        Review of Systems  Musculoskeletal:  Positive for joint pain.        Objective:   Vitals: BP 106/65   Pulse 96   Temp 98 F (36.7 C) (Tympanic)   Ht 5' 9 (1.753 m)   Wt 182 lb (82.6 kg)   SpO2 94%   BMI 26.88 kg/m    Physical Exam Feet:     Left foot:     Skin integrity: Erythema and warmth present.       Results:   Studies obtained and personally reviewed by me:   Labs:       Component Value Date/Time   NA 141  04/17/2024 1134   K 4.5 04/17/2024 1134   CL 106 04/17/2024 1134   CO2 20 04/17/2024 1134   GLUCOSE 90 04/17/2024 1134   GLUCOSE 101 (H) 07/20/2022 1210   BUN 11 04/17/2024 1134   CREATININE 1.11 04/17/2024 1134   CREATININE 1.29 (H) 12/24/2020 1431   CALCIUM  9.7 04/17/2024 1134   PROT 6.9 04/17/2024 1134   ALBUMIN 4.5 04/17/2024 1134   AST 32 04/17/2024 1134   ALT 33 04/17/2024 1134   ALKPHOS 53 04/17/2024 1134   BILITOT 0.7 04/17/2024 1134   GFRNONAA >60 07/20/2022 1210   GFRAA 73 08/06/2020 1140     Lab Results  Component Value Date   WBC 5.5 10/17/2023   HGB 13.4 10/17/2023   HCT 39.3 10/17/2023   MCV 93 10/17/2023   PLT 200 10/17/2023    Lab Results  Component Value Date   CHOL 165 04/17/2024   HDL 66 04/17/2024   LDLCALC 86 04/17/2024   TRIG 70 04/17/2024   CHOLHDL 2.5 04/17/2024    Lab Results  Component Value Date   HGBA1C 5.8 (H) 04/17/2024     Lab Results  Component Value Date  TSH 1.030 01/21/2022     Lab Results  Component Value Date   PSA1 0.7 10/17/2023   PSA1 0.5 10/12/2022   PSA1 0.8 10/07/2021   PSA 0.925 03/04/2022     Assessment & Plan:   Orders Placed This Encounter  Procedures   Uric Acid    Remote health to draw?:   No   Meds ordered this encounter  Medications   methylPREDNISolone  (MEDROL ) 4 MG tablet    Sig: Take in tapering course as directed starting with 6 tabs day 1 abd decreasing by one tab daily 6-5-4-3-2-1    Dispense:  21 tablet    Refill:  0   allopurinol  (ZYLOPRIM ) 300 MG tablet    Sig: Take 1 tablet (300 mg total) by mouth daily.    Dispense:  30 tablet    Refill:  2   DISCONTD: HYDROcodone -acetaminophen  (NORCO/VICODIN) 5-325 MG tablet    Sig: Take 1 tablet by mouth every 6 (six) hours as needed for moderate pain (pain score 4-6).    Dispense:  30 tablet    Refill:  0   HYDROcodone -acetaminophen  (NORCO/VICODIN) 5-325 MG tablet    Sig: Take 1 tablet by mouth every 6 (six) hours as needed for moderate pain  (pain score 4-6).    Dispense:  15 tablet    Refill:  0   Gout: advised to keep colchicine  0.6 on hand for flair ups. He has had foot pain for several days. He took colchicine  for several days and the pain was not relieved. He said the past couple of days he has eaten venison and red wine.  His fifth toe on his left foot is erythematous and hot.     Medrol  dosepak 4 mg tapering course 6-5-4-3-2-1 prescribed.    Allopurinol   300 mg daily prescribed.   Norco/[place] 5-325 mg every 6 hours as needed for pain prescribed.    Discontinue colchicine .     Hyperlipidemia: treated with Rosuvastatin  20 mg daily.   Was seen in the ED on 01/25/2024 for right foot pain. There was tenderness and swelling above the 5th MTP.  XR obtained shows no evidence of fracture or dislocation. He was given Toradol  60 mg IV in the hospital and prescribed colchicine  0.6 mg daily on discharge.      I,Makayla C Reid,acting as a scribe for Ronal JINNY Hailstone, MD.,have documented all relevant documentation on the behalf of Ronal JINNY Hailstone, MD,as directed by  Ronal JINNY Hailstone, MD while in the presence of Ronal JINNY Hailstone, MD.      "

## 2024-07-21 ENCOUNTER — Ambulatory Visit: Payer: Self-pay | Admitting: Internal Medicine

## 2024-07-21 ENCOUNTER — Encounter: Payer: Self-pay | Admitting: Internal Medicine

## 2024-07-21 LAB — URIC ACID: Uric Acid, Serum: 7.7 mg/dL (ref 4.0–8.0)

## 2024-07-21 NOTE — Patient Instructions (Addendum)
 It was a pleasure to see you today. Please take Norco 5/325 sparingly every 4-6 hours for pain. Discontinue colchicine . May begin Allopurinol  300 mg daily. Take prednisone  in tapering course as directed starting with 6 tabs day 1 and decreasing by one tab daily 6-5-4-3-2-1 tapering course. Watch diet. Alcohol may contribute to flare ups of gout as does rich food.

## 2024-07-23 ENCOUNTER — Encounter: Payer: Self-pay | Admitting: Internal Medicine

## 2024-07-26 ENCOUNTER — Telehealth: Payer: Self-pay | Admitting: Internal Medicine

## 2024-07-26 DIAGNOSIS — M109 Gout, unspecified: Secondary | ICD-10-CM

## 2024-07-26 MED ORDER — METHYLPREDNISOLONE 4 MG PO TABS: ORAL_TABLET | ORAL | 0 refills | Status: AC

## 2024-07-26 NOTE — Telephone Encounter (Signed)
 Received message that gout attack has not resolved. Delon will call him with specific instructions per phone note, refilling 6 days Medrol  4 mg taper. May need to follow up with PCP if not improving next week.

## 2024-10-10 ENCOUNTER — Ambulatory Visit: Payer: Self-pay

## 2024-10-17 ENCOUNTER — Encounter: Admitting: Nurse Practitioner
